# Patient Record
Sex: Male | Born: 1945
Health system: Southern US, Community
[De-identification: ages and names within clinical notes are randomized; demographics above are authoritative.]

## PROBLEM LIST (undated history)

## (undated) DIAGNOSIS — I10 Essential (primary) hypertension: Secondary | ICD-10-CM

## (undated) DIAGNOSIS — J45909 Unspecified asthma, uncomplicated: Secondary | ICD-10-CM

## (undated) DIAGNOSIS — E785 Hyperlipidemia, unspecified: Secondary | ICD-10-CM

## (undated) DIAGNOSIS — G4730 Sleep apnea, unspecified: Secondary | ICD-10-CM

## (undated) DIAGNOSIS — H538 Other visual disturbances: Secondary | ICD-10-CM

## (undated) HISTORY — DX: Other visual disturbances: H53.8

## (undated) HISTORY — PX: POLYPECTOMY: SHX149

## (undated) HISTORY — PX: OTHER SURGICAL HISTORY: SHX169

## (undated) HISTORY — PX: TONSILLECTOMY: SUR1361

## (undated) HISTORY — DX: Sleep apnea, unspecified: G47.30

## (undated) HISTORY — DX: Hyperlipidemia, unspecified: E78.5

## (undated) HISTORY — DX: Essential (primary) hypertension: I10

## (undated) HISTORY — PX: COLONOSCOPY: SHX174

## (undated) HISTORY — PX: HERNIA REPAIR: SHX51

## (undated) HISTORY — DX: Unspecified asthma, uncomplicated: J45.909

---

## 1998-08-31 ENCOUNTER — Encounter (INDEPENDENT_AMBULATORY_CARE_PROVIDER_SITE_OTHER): Payer: Self-pay | Admitting: Specialist

## 1998-08-31 ENCOUNTER — Other Ambulatory Visit: Admission: RE | Admit: 1998-08-31 | Discharge: 1998-08-31 | Payer: Self-pay | Admitting: Gastroenterology

## 2004-11-26 ENCOUNTER — Ambulatory Visit: Payer: Self-pay | Admitting: Internal Medicine

## 2004-12-03 ENCOUNTER — Ambulatory Visit: Payer: Self-pay | Admitting: Internal Medicine

## 2005-01-03 ENCOUNTER — Ambulatory Visit: Payer: Self-pay | Admitting: Internal Medicine

## 2005-03-25 ENCOUNTER — Ambulatory Visit: Payer: Self-pay | Admitting: Internal Medicine

## 2005-07-22 ENCOUNTER — Ambulatory Visit: Payer: Self-pay | Admitting: Internal Medicine

## 2005-08-27 ENCOUNTER — Ambulatory Visit: Payer: Self-pay | Admitting: Internal Medicine

## 2005-08-29 ENCOUNTER — Ambulatory Visit: Payer: Self-pay | Admitting: Internal Medicine

## 2005-09-29 ENCOUNTER — Ambulatory Visit (HOSPITAL_BASED_OUTPATIENT_CLINIC_OR_DEPARTMENT_OTHER): Admission: RE | Admit: 2005-09-29 | Discharge: 2005-09-29 | Payer: Self-pay | Admitting: Internal Medicine

## 2005-10-03 ENCOUNTER — Ambulatory Visit: Payer: Self-pay | Admitting: Pulmonary Disease

## 2005-10-28 ENCOUNTER — Ambulatory Visit: Payer: Self-pay | Admitting: Internal Medicine

## 2005-11-14 ENCOUNTER — Ambulatory Visit: Payer: Self-pay | Admitting: Emergency Medicine

## 2006-04-20 ENCOUNTER — Ambulatory Visit: Payer: Self-pay | Admitting: Internal Medicine

## 2006-04-20 LAB — CONVERTED CEMR LAB
Albumin: 4 g/dL (ref 3.5–5.2)
Alkaline Phosphatase: 59 units/L (ref 39–117)
BUN: 16 mg/dL (ref 6–23)
Basophils Absolute: 0.1 10*3/uL (ref 0.0–0.1)
Bilirubin Urine: NEGATIVE
Cholesterol: 237 mg/dL (ref 0–200)
Direct LDL: 130.8 mg/dL
Eosinophils Absolute: 0.5 10*3/uL (ref 0.0–0.6)
GFR calc Af Amer: 79 mL/min
GFR calc non Af Amer: 65 mL/min
HCT: 50.2 % (ref 39.0–52.0)
HDL: 35.7 mg/dL — ABNORMAL LOW (ref >39.0)
Hemoglobin, Urine: NEGATIVE
Hemoglobin: 17.5 g/dL — ABNORMAL HIGH (ref 13.0–17.0)
Leukocytes, UA: NEGATIVE
MCHC: 34.9 g/dL (ref 30.0–36.0)
MCV: 87.1 fL (ref 78.0–100.0)
Monocytes Absolute: 0.6 10*3/uL (ref 0.2–0.7)
Monocytes Relative: 9.3 % (ref 3.0–11.0)
Neutro Abs: 3.7 10*3/uL (ref 1.4–7.7)
Neutrophils Relative %: 56.7 % (ref 43.0–77.0)
PSA: 0.44 ng/mL (ref 0.10–4.00)
Potassium: 3.6 meq/L (ref 3.5–5.1)
RDW: 12.4 % (ref 11.5–14.6)
Sodium: 141 meq/L (ref 135–145)
TSH: 2.6 microintl units/mL (ref 0.35–5.50)
Total Bilirubin: 2.4 mg/dL — ABNORMAL HIGH (ref 0.3–1.2)
Total CHOL/HDL Ratio: 6.6
Urobilinogen, UA: 0.2 (ref 0.0–1.0)
VLDL: 54 mg/dL — ABNORMAL HIGH (ref 0–40)
pH: 6 (ref 5.0–8.0)

## 2006-04-24 ENCOUNTER — Ambulatory Visit: Payer: Self-pay | Admitting: Internal Medicine

## 2006-04-27 ENCOUNTER — Ambulatory Visit: Payer: Self-pay | Admitting: Internal Medicine

## 2006-05-07 ENCOUNTER — Ambulatory Visit: Payer: Self-pay | Admitting: Internal Medicine

## 2006-07-06 ENCOUNTER — Ambulatory Visit: Payer: Self-pay | Admitting: Internal Medicine

## 2006-10-10 ENCOUNTER — Encounter: Payer: Self-pay | Admitting: Internal Medicine

## 2006-10-10 DIAGNOSIS — E785 Hyperlipidemia, unspecified: Secondary | ICD-10-CM | POA: Insufficient documentation

## 2006-10-10 DIAGNOSIS — J309 Allergic rhinitis, unspecified: Secondary | ICD-10-CM | POA: Insufficient documentation

## 2006-10-10 DIAGNOSIS — I10 Essential (primary) hypertension: Secondary | ICD-10-CM | POA: Insufficient documentation

## 2006-10-10 DIAGNOSIS — Z9889 Other specified postprocedural states: Secondary | ICD-10-CM

## 2006-12-10 ENCOUNTER — Ambulatory Visit: Payer: Self-pay | Admitting: Internal Medicine

## 2007-03-10 ENCOUNTER — Ambulatory Visit: Payer: Self-pay | Admitting: Gastroenterology

## 2007-03-25 ENCOUNTER — Encounter: Payer: Self-pay | Admitting: Internal Medicine

## 2007-03-25 ENCOUNTER — Ambulatory Visit: Payer: Self-pay | Admitting: Gastroenterology

## 2007-04-20 ENCOUNTER — Ambulatory Visit: Payer: Self-pay | Admitting: Internal Medicine

## 2007-04-20 LAB — CONVERTED CEMR LAB
Basophils Relative: 0.3 % (ref 0.0–1.0)
Bilirubin Urine: NEGATIVE
Bilirubin, Direct: 0.2 mg/dL (ref 0.0–0.3)
CO2: 31 meq/L (ref 19–32)
Cholesterol: 253 mg/dL (ref 0–200)
Direct LDL: 75.3 mg/dL
Eosinophils Absolute: 0.3 10*3/uL (ref 0.0–0.6)
Eosinophils Relative: 5 % (ref 0.0–5.0)
GFR calc Af Amer: 79 mL/min
Glucose, Bld: 116 mg/dL — ABNORMAL HIGH (ref 70–99)
HDL: 32.9 mg/dL — ABNORMAL LOW (ref >39.0)
Hemoglobin: 17.2 g/dL — ABNORMAL HIGH (ref 13.0–17.0)
Leukocytes, UA: NEGATIVE
Lymphocytes Relative: 26.6 % (ref 12.0–46.0)
MCV: 88.5 fL (ref 78.0–100.0)
Monocytes Absolute: 0.6 10*3/uL (ref 0.2–0.7)
Monocytes Relative: 8.6 % (ref 3.0–11.0)
Neutro Abs: 3.8 10*3/uL (ref 1.4–7.7)
PSA: 0.51 ng/mL (ref 0.10–4.00)
Potassium: 3.6 meq/L (ref 3.5–5.1)
Specific Gravity, Urine: 1.015 (ref 1.000–1.03)
TSH: 1.72 microintl units/mL (ref 0.35–5.50)
Total Protein, Urine: NEGATIVE mg/dL
Total Protein: 7.1 g/dL (ref 6.0–8.3)
Triglycerides: 639 mg/dL (ref 0–149)
WBC: 6.4 10*3/uL (ref 4.5–10.5)

## 2007-04-26 ENCOUNTER — Ambulatory Visit: Payer: Self-pay | Admitting: Internal Medicine

## 2007-04-26 DIAGNOSIS — H6120 Impacted cerumen, unspecified ear: Secondary | ICD-10-CM | POA: Insufficient documentation

## 2007-04-30 ENCOUNTER — Encounter: Admission: RE | Admit: 2007-04-30 | Discharge: 2007-04-30 | Payer: Self-pay | Admitting: Internal Medicine

## 2007-05-02 ENCOUNTER — Telehealth: Payer: Self-pay | Admitting: Internal Medicine

## 2007-05-28 ENCOUNTER — Encounter: Payer: Self-pay | Admitting: Internal Medicine

## 2007-11-30 ENCOUNTER — Encounter: Payer: Self-pay | Admitting: Internal Medicine

## 2008-01-05 ENCOUNTER — Ambulatory Visit: Payer: Self-pay | Admitting: Internal Medicine

## 2008-02-23 ENCOUNTER — Encounter: Payer: Self-pay | Admitting: Internal Medicine

## 2008-04-25 ENCOUNTER — Ambulatory Visit: Payer: Self-pay | Admitting: Internal Medicine

## 2008-04-25 LAB — CONVERTED CEMR LAB
ALT: 24 units/L (ref 0–53)
AST: 22 units/L (ref 0–37)
Albumin: 3.9 g/dL (ref 3.5–5.2)
BUN: 19 mg/dL (ref 6–23)
Basophils Relative: 0.8 % (ref 0.0–3.0)
CO2: 28 meq/L (ref 19–32)
Chloride: 101 meq/L (ref 96–112)
Creatinine, Ser: 1.2 mg/dL (ref 0.4–1.5)
Direct LDL: 107.2 mg/dL
Eosinophils Absolute: 0.3 10*3/uL (ref 0.0–0.7)
Eosinophils Relative: 4.1 % (ref 0.0–5.0)
GFR calc non Af Amer: 65 mL/min
HCT: 49 % (ref 39.0–52.0)
MCV: 89.8 fL (ref 78.0–100.0)
Neutrophils Relative %: 51.7 % (ref 43.0–77.0)
RBC: 5.46 M/uL (ref 4.22–5.81)
Specific Gravity, Urine: 1.025 (ref 1.000–1.03)
Urine Glucose: NEGATIVE mg/dL
Urobilinogen, UA: 0.2 (ref 0.0–1.0)
WBC: 6.2 10*3/uL (ref 4.5–10.5)
pH: 5.5 (ref 5.0–8.0)

## 2008-05-02 ENCOUNTER — Ambulatory Visit: Payer: Self-pay | Admitting: Internal Medicine

## 2008-05-02 DIAGNOSIS — H18899 Other specified disorders of cornea, unspecified eye: Secondary | ICD-10-CM | POA: Insufficient documentation

## 2008-05-03 DIAGNOSIS — IMO0002 Reserved for concepts with insufficient information to code with codable children: Secondary | ICD-10-CM

## 2008-11-21 ENCOUNTER — Encounter: Payer: Self-pay | Admitting: Internal Medicine

## 2008-12-12 ENCOUNTER — Ambulatory Visit: Payer: Self-pay | Admitting: Internal Medicine

## 2009-05-04 ENCOUNTER — Telehealth: Payer: Self-pay | Admitting: Internal Medicine

## 2009-05-18 ENCOUNTER — Ambulatory Visit: Payer: Self-pay | Admitting: Internal Medicine

## 2009-05-18 LAB — CONVERTED CEMR LAB
AST: 23 units/L (ref 0–37)
Alkaline Phosphatase: 52 units/L (ref 39–117)
BUN: 17 mg/dL (ref 6–23)
Basophils Absolute: 0 10*3/uL (ref 0.0–0.1)
Bilirubin Urine: NEGATIVE
Calcium: 9 mg/dL (ref 8.4–10.5)
Creatinine, Ser: 1.2 mg/dL (ref 0.4–1.5)
GFR calc non Af Amer: 64.72 mL/min (ref >60)
Glucose, Bld: 99 mg/dL (ref 70–99)
HDL: 43.7 mg/dL (ref >39.00)
Hemoglobin, Urine: NEGATIVE
Lymphocytes Relative: 29.9 % (ref 12.0–46.0)
Monocytes Relative: 11.2 % (ref 3.0–12.0)
PSA: 0.6 ng/mL (ref 0.10–4.00)
Platelets: 157 10*3/uL (ref 150.0–400.0)
RDW: 12.4 % (ref 11.5–14.6)
TSH: 1.82 microintl units/mL (ref 0.35–5.50)
Total Bilirubin: 2.3 mg/dL — ABNORMAL HIGH (ref 0.3–1.2)
Triglycerides: 768 mg/dL — ABNORMAL HIGH (ref 0.0–149.0)
Urine Glucose: NEGATIVE mg/dL
Urobilinogen, UA: 0.2 (ref 0.0–1.0)
VLDL: 153.6 mg/dL — ABNORMAL HIGH (ref 0.0–40.0)

## 2009-05-22 ENCOUNTER — Ambulatory Visit: Payer: Self-pay | Admitting: Internal Medicine

## 2009-07-27 ENCOUNTER — Ambulatory Visit: Payer: Self-pay | Admitting: Internal Medicine

## 2009-07-27 LAB — CONVERTED CEMR LAB
ALT: 33 units/L (ref 0–53)
Albumin: 4.3 g/dL (ref 3.5–5.2)
Alkaline Phosphatase: 52 units/L (ref 39–117)
Total Protein: 7.1 g/dL (ref 6.0–8.3)

## 2009-08-16 ENCOUNTER — Telehealth: Payer: Self-pay | Admitting: Internal Medicine

## 2009-08-20 ENCOUNTER — Ambulatory Visit: Payer: Self-pay | Admitting: Internal Medicine

## 2009-12-05 ENCOUNTER — Telehealth: Payer: Self-pay | Admitting: Internal Medicine

## 2009-12-11 ENCOUNTER — Ambulatory Visit: Payer: Self-pay | Admitting: Internal Medicine

## 2010-01-04 ENCOUNTER — Encounter: Payer: Self-pay | Admitting: Internal Medicine

## 2010-03-08 ENCOUNTER — Telehealth: Payer: Self-pay | Admitting: Internal Medicine

## 2010-04-02 NOTE — Progress Notes (Signed)
  Phone Note Refill Request Message from:  Fax from Pharmacy on December 05, 2009 1:47 PM  Refills Requested: Medication #1:  TOPROL XL 50 MG  TB24 1 tab qd Initial call taken by: Ami Bullins CMA,  December 05, 2009 1:47 PM    Prescriptions: TOPROL XL 50 MG  TB24 (METOPROLOL SUCCINATE) 1 tab qd  #30 x 6   Entered by:   Ami Bullins CMA   Authorized by:   Jacques Navy MD   Signed by:   Bill Salinas CMA on 12/05/2009   Method used:   Electronically to        [facility]* (retail)       [address]       [place], [place]  [id number]       Ph: [phone]       Fax: [phone]   RxID:   [id number]

## 2010-04-02 NOTE — Miscellaneous (Signed)
Summary: [facility]   Imported By: Lester [place] 05/31/2009 11:37:31  _____________________________________________________________________  External Attachment:    Type:   Image     Comment:   External Document

## 2010-04-02 NOTE — Assessment & Plan Note (Signed)
Summary: flu shot/men/cd   Nurse Visit   Allergies: No Known Drug Allergies  Orders Added: 1)  Admin 1st Vaccine [90471] 2)  Flu Vaccine 109yrs + [54098] Flu Vaccine Consent Questions     Do you have a history of severe allergic reactions to this vaccine? no    Any prior history of allergic reactions to egg and/or gelatin? no    Do you have a sensitivity to the preservative Thimersol? no    Do you have a past history of Guillan-Barre Syndrome? no    Do you currently have an acute febrile illness? no    Have you ever had a severe reaction to latex? no    Vaccine information given and explained to patient? yes    Are you currently pregnant? no    Lot Number:[id number]   Exp Date:08/31/2010   Site Given  Left Deltoid IMes-CCC]  .lbflu

## 2010-04-02 NOTE — Assessment & Plan Note (Signed)
Summary: shingles shot/men/bcbs-pt called ins and they will cover/cd   Nurse Visit   Allergies: No Known Drug Allergies  Immunizations Administered:  Zostavax # 1:    Vaccine Type: Zostavax    Site: left deltoid    Mfr: Merck    Dose: 0.5 ml    Route: [place]    Given by: Margaret Pyle, CMA    Exp. Date: 08/10/2010    Lot #: [id number]  Orders Added: 1)  Zoster (Shingles) Vaccine Live [90736] 2)  Admin 1st Vaccine [40347]

## 2010-04-02 NOTE — Letter (Signed)
Summary: [facility]   Imported By: Lester [place] 01/14/2010 10:13:20  _____________________________________________________________________  External Attachment:    Type:   Image     Comment:   External Document

## 2010-04-02 NOTE — Progress Notes (Signed)
  Phone Note Refill Request Message from:  Fax from Pharmacy on May 04, 2009 1:19 PM  Refills Requested: Medication #1:  TOPROL XL 50 MG  TB24 1 tab qd Initial call taken by: Ami Bullins CMA,  May 04, 2009 1:19 PM    Prescriptions: TOPROL XL 50 MG  TB24 (METOPROLOL SUCCINATE) 1 tab qd  #30 x 6   Entered by:   Ami Bullins CMA   Authorized by:   Jacques Navy MD   Signed by:   Bill Salinas CMA on 05/04/2009   Method used:   Electronically to        [facility]* (retail)       [address]       [place], [place]  [id number]       Ph: [phone]       Fax: [phone]   RxID:   [id number]

## 2010-04-02 NOTE — Assessment & Plan Note (Signed)
Summary: CPX/BCBS/#/CD   Vital Signs:  Patient profile:   65 year old male Height:      67 inches (170.18 cm) Weight:      169.75 pounds (77.16 kg) BMI:     26.68 O2 Sat:      98 % on Room air Temp:     98.7 degrees F (37.06 degrees C) oral Pulse rate:   93 / minute BP sitting:   152 / 82  (left arm) Cuff size:   regular  Vitals Entered By: Bill Salinas CMA (May 22, 2009 1:27 PM) Taken by Sydell Axon SMA  O2 Flow:  Room air CC: Pt here for CPX. per pt, last eye exam was 08/2008 with Dr Ernesto Rutherford (normal) .//[place]   Primary Care Provider:  Shontavia Mickel  CC:  Pt here for CPX. per pt and last eye exam was 08/2008 with Dr Ernesto Rutherford (normal) .//[place].  History of Present Illness: Patient presents for routine medical evaluation. He has had a good year with no interval illness, injury or surgery. Patient has no complaints or problems.   Current Medications (verified): 1)  Toprol Xl 50 Mg  Tb24 (Metoprolol Succinate) .Marland Kitchen.. 1 Tab Qd 2)  Pulmicort Flexhaler 90 Mcg/act  Inha (Budesonide (Inhalation)) .Marland Kitchen.. 1 Puff Once Daily 3)  Hydrochlorothiazide 25 Mg Tabs (Hydrochlorothiazide) .... Take 1 Tablet By Mouth Once A Day 4)  Gnp Niacin Tr 500 Mg Cr-Tabs (Niacin) .Marland Kitchen.. 1 By Mouth At Bedtime 5)  Aspirin 81 Mg  Tabs (Aspirin) .... Take One Tablet By Mouth 30 Min Before Niacin  Allergies (verified): No Known Drug Allergies  Past History:  Past Medical History: Last updated: May 21, 2007 Allergic rhinitis Hyperlipidemia Hypertension sleep apnea - moderate  Past Surgical History: Last updated: 10/10/2006 Tonsillectomy Herniorrhaphy  Family History: Last updated: May 21, 2007 father-deceased @ 80; asthma and respiratory problems/COPD mother - deceased @ 31; breast cancer survivor, died of alzheimer's Maternal uncle with CAD/CABG Neg- colon, prostate cancer; DM  Social History: Last updated: 2007-05-21 married '83 [employer]; [employer] @ [employer] for [employer] no children Work:  Oceanographer for [employer] Marriage in good health  Risk Factors: Alcohol Use: <1 (21-May-2007) Caffeine Use: 3 (05/21/07)  Risk Factors: Smoking Status: never (05-21-07)  Review of Systems  The patient denies anorexia, fever, weight loss, weight gain, vision loss, decreased hearing, hoarseness, chest pain, syncope, dyspnea on exertion, prolonged cough, headaches, hemoptysis, abdominal pain, hematochezia, hematuria, incontinence, genital sores, muscle weakness, transient blindness, difficulty walking, depression, unusual weight change, enlarged lymph nodes, and angioedema.    Physical Exam  General:  Well-developed,well-nourished,in no acute distress; alert,appropriate and cooperative throughout examination Head:  Normocephalic and atraumatic without obvious abnormalities. No apparent alopecia or balding. Eyes:  vision grossly intact, pupils equal, pupils round, pupils reactive to light, and pupils react to accomodation.   Ears:  R ear normal and L ear normal.   Nose:  no external deformity and no external erythema.   Mouth:  Oral mucosa and oropharynx without lesions or exudates.  Teeth in good repair. Neck:  supple, full ROM, no thyromegaly, and no carotid bruits.   Chest Wall:  no deformities and no tenderness.   Lungs:  Normal respiratory effort, chest expands symmetrically. Lungs are clear to auscultation, no crackles or wheezes. Heart:  Normal rate and regular rhythm. S1 and S2 normal without gallop, murmur, click, rub or other extra sounds. Abdomen:  soft, non-tender, normal bowel sounds, no guarding, no rigidity, and no hepatomegaly.   Rectal:  No external  abnormalities noted. Normal sphincter tone. No rectal masses or tenderness. Genitalia:  varicocele left  Prostate:  Prostate gland firm and smooth, no enlargement, nodularity, tenderness, mass, asymmetry or induration. Msk:  normal ROM, no joint tenderness, no joint swelling, no joint warmth, and no joint deformities.     Pulses:  R radial normal and R femoral normal.   Extremities:  No clubbing, cyanosis, edema, or deformity noted with normal full range of motion of all joints.   Neurologic:  alert & oriented X3, cranial nerves II-XII intact, strength normal in all extremities, sensation intact to light touch, gait normal, and DTRs symmetrical and normal.   Skin:  turgor normal, color normal, no rashes, no suspicious lesions, no petechiae, and no ulcerations.   Cervical Nodes:  No lymphadenopathy noted Axillary Nodes:  no R axillary adenopathy and no L axillary adenopathy.   Inguinal Nodes:  no R inguinal adenopathy and no L inguinal adenopathy.   Psych:  Oriented X3, memory intact for recent and remote, normally interactive, and good eye contact.     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 Dione Booze. He does fairly well with contacts.   Problem # 2:  HYPERTENSION (ICD-401.9)  His updated medication list for this problem includes:    Toprol Xl 50 Mg Tb24 (Metoprolol succinate) .Marland Kitchen... 1 tab qd    Hydrochlorothiazide 25 Mg Tabs (Hydrochlorothiazide) .Marland Kitchen... Take 1 tablet by mouth once a day  BP today: 152/82 Prior BP: 124/74 (05/02/2008)  Mild elevation at today's visit. He reports generally good control at home.  Plan - continue present meds           monitor BP at home.   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  Plan - increase Niasin to 1500mg  at bedtime           f/u lab 4 weeks after attaining recommended dose.  His updated medication list for this problem includes:    Gnp Niacin Tr 500 Mg Cr-tabs (Niacin) .Marland Kitchen... 1 by mouth at bedtime  Problem # 4:  ALLERGIC RHINITIS (ICD-477.9) OTC meds are fine  Problem # 5:  Preventive Health Care (ICD-V70.0) Unremarkable history and normal exam. Labs are fine. Current with colorectal cancer screening. Immunizations current for pneumonia,  tetnus and flu. He will check coverage for shingles vaccine or defer until 64.   In summary - a very nice man who is medically stable and doing well. He will return as needed or in 1 year.   Complete Medication List: 1)  Toprol Xl 50 Mg Tb24 (Metoprolol succinate) .Marland Kitchen.. 1 tab qd 2)  Pulmicort Flexhaler 90 Mcg/act Inha (Budesonide (inhalation)) .Marland Kitchen.. 1 puff once daily 3)  Hydrochlorothiazide 25 Mg Tabs (Hydrochlorothiazide) .... Take 1 tablet by mouth once a day 4)  Gnp Niacin Tr 500 Mg Cr-tabs (Niacin) .Marland Kitchen.. 1 by mouth at bedtime 5)  Aspirin 81 Mg Tabs (Aspirin) .... Take one tablet by mouth 30 min before niacin  Other Orders: Pneumococcal Vaccine ([address]109) Admin 1st Vaccine (60454)   Patient: Jake Simon Note: All result statuses are Final unless otherwise noted.  Tests: (1) BMP (METABOL)   Sodium                    136 mEq/L                   135-145   Potassium            [  L]  3.3 mEq/L                   3.5-5.1   Chloride                  97 mEq/L                    96-112   Carbon Dioxide            31 mEq/L                    19-32   Glucose                   99 mg/dL                    13-08   BUN                       17 mg/dL                    6-57   Creatinine                1.2 mg/dL                   8.4-6.9   Calcium                   9.0 mg/dL                   6.2-95.2   GFR                       64.72 mL/min                >60  Tests: (2) CBC Platelet w/Diff (CBCD)   White Cell Count          6.3 K/uL                    4.5-10.5   Red Cell Count            5.32 Mil/uL                 4.22-5.81   Hemoglobin                16.7 g/dL                   84.1-32.4   Hematocrit                48.7 %                      39.0-52.0   MCV                       91.6 fl                     78.0-100.0   MCHC                      34.3 g/dL                   40.1-02.7   RDW                       12.4 %  11.5-14.6   Platelet Count            157.0 K/uL                   150.0-400.0   Neutrophil %              52.8 %                      43.0-77.0   Lymphocyte %              29.9 %                      12.0-46.0   Monocyte %                11.2 %                      3.0-12.0   Eosinophils%         [H]  5.5 %                       0.0-5.0   Basophils %               0.6 %                       0.0-3.0   Neutrophill Absolute      3.4 K/uL                    1.4-7.7   Lymphocyte Absolute       1.9 K/uL                    0.7-4.0   Monocyte Absolute         0.7 K/uL                    0.1-1.0  Eosinophils, Absolute                             0.3 K/uL                    0.0-0.7   Basophils Absolute        0.0 K/uL                    0.0-0.1  Tests: (3) Hepatic/Liver Function Panel (HEPATIC)   Total Bilirubin      [H]  2.3 mg/dL                   8.1-1.9   Direct Bilirubin          0.3 mg/dL                   1.4-7.8   Alkaline Phosphatase      52 U/L                      39-117   AST                       23 U/L                      0-37   ALT  26 U/L                      0-53   Total Protein             7.4 g/dL                    1.4-7.8   Albumin                   4.1 g/dL                    2.9-5.6  Tests: (4) TSH (TSH)   FastTSH                   1.82 uIU/mL                 0.35-5.50  Tests: (5) Lipid Panel (LIPID)   Cholesterol          [H]  273 mg/dL                   2-130     ATP III Classification            Desirable:  < 200 mg/dL                    Borderline High:  200 - 239 mg/dL               High:  > = 240 mg/dL   Triglycerides        [H]  768.0 mg/dL                 8.6-578.4     Normal:  <150 mg/dL     Borderline High:  696 - 199 mg/dL     Triglyceride is over 400; calculations on Lipids are invalid.   HDL                       29.52 mg/dL                 >84.13   VLDL Cholesterol     [H]  153.6 mg/dL                 2.4-40.1  CHO/HDL Ratio:  CHD Risk                             6                     Men          Women     1/2 Average Risk     3.4          3.3     Average Risk          5.0          4.4     2X Average Risk          9.6          7.1     3X Average Risk          15.0          11.0                           Tests: (6) UDip Only (UDIP)   Color  Dark Yellow       RANGE:  Yellow;Lt. Yellow   Clarity                   CLEAR                       Clear   Specific Gravity          1.020                       1.000 - 1.030   Urine Ph                  6.0                         5.0-8.0   Protein                   NEGATIVE                    Negative   Urine Glucose             NEGATIVE                    Negative   Ketones                   NEGATIVE                    Negative   Urine Bilirubin           NEGATIVE                    Negative   Blood                     NEGATIVE                    Negative   Urobilinogen              0.2                         0.0 - 1.0   Leukocyte Esterace        NEGATIVE                    Negative   Nitrite                   NEGATIVE                    Negative  Tests: (7) Prostate Specific Antigen (PSA)   PSA-Hyb                   0.60 ng/mL                  0.10-4.00  Tests: (8) Cholesterol LDL - Direct (DIRLDL)  Cholesterol LDL - Direct                             66.0 mg/dL  Immunizations Administered:  Pneumonia Vaccine:    Vaccine Type: Pneumovax    Site: left deltoid    Mfr: Merck    Dose: 0.5 ml    Route: IM    Given by: Ami Bullins CMA    Exp. Date: 10/15/2010  Lot #: [id number]    VIS given: 09/29/95 version given May 22, 2009.

## 2010-04-02 NOTE — Progress Notes (Signed)
  Phone Note Outgoing Call   Reason for Call: Discuss lab or test results Summary of Call: reviewed labs. Tgy 1012. Plan stop niacin. Start gemfibrozil 300mg  once daily x 3, 300mg  two times a day x 3, 600/300 x 3 then 600mg  two times a day.  Initial call taken by: Jacques Navy MD,  August 16, 2009 12:53 PM    New/Updated Medications: GEMFIBROZIL 600 MG TABS (GEMFIBROZIL) 1 by mouth two times a day Prescriptions: GEMFIBROZIL 600 MG TABS (GEMFIBROZIL) 1 by mouth two times a day  #60 x 12   Entered and Authorized by:   Jacques Navy MD   Signed by:   Jacques Navy MD on 08/16/2009   Method used:   Electronically to        [facility]* (retail)       [address]       [place], [place]  [id number]       Ph: [phone]       Fax: [phone]   RxID:   [id number]

## 2010-04-04 NOTE — Progress Notes (Signed)
       New/Updated Medications: PULMICORT FLEXHALER 90 MCG/ACT  INHA (BUDESONIDE (INHALATION)) 1-2 puffs daily Prescriptions: PULMICORT FLEXHALER 90 MCG/ACT  INHA (BUDESONIDE (INHALATION)) 1-2 puffs daily  #1 month x 2   Entered by:   Ami Bullins CMA   Authorized by:   Jacques Navy MD   Signed by:   Bill Salinas CMA on 03/08/2010   Method used:   Electronically to        [facility]* (retail)       [address]       [place], [place]  [id number]       Ph: [phone]       Fax: [phone]   RxID:   [id number]

## 2010-06-03 ENCOUNTER — Encounter: Payer: Self-pay | Admitting: Internal Medicine

## 2010-06-26 ENCOUNTER — Encounter: Payer: Self-pay | Admitting: Internal Medicine

## 2010-06-26 ENCOUNTER — Ambulatory Visit (INDEPENDENT_AMBULATORY_CARE_PROVIDER_SITE_OTHER): Payer: Medicare Other | Admitting: Internal Medicine

## 2010-06-26 ENCOUNTER — Other Ambulatory Visit (INDEPENDENT_AMBULATORY_CARE_PROVIDER_SITE_OTHER): Payer: Medicare Other

## 2010-06-26 ENCOUNTER — Other Ambulatory Visit (INDEPENDENT_AMBULATORY_CARE_PROVIDER_SITE_OTHER): Payer: Medicare Other | Admitting: Internal Medicine

## 2010-06-26 VITALS — BP 140/84 | HR 74 | Temp 98.7°F | Wt 167.0 lb

## 2010-06-26 DIAGNOSIS — I10 Essential (primary) hypertension: Secondary | ICD-10-CM

## 2010-06-26 DIAGNOSIS — J309 Allergic rhinitis, unspecified: Secondary | ICD-10-CM

## 2010-06-26 DIAGNOSIS — E785 Hyperlipidemia, unspecified: Secondary | ICD-10-CM

## 2010-06-26 DIAGNOSIS — Z136 Encounter for screening for cardiovascular disorders: Secondary | ICD-10-CM

## 2010-06-26 DIAGNOSIS — Z0000 Encounter for general adult medical examination without abnormal findings: Secondary | ICD-10-CM

## 2010-06-26 LAB — LIPID PANEL
Cholesterol: 212 mg/dL — ABNORMAL HIGH (ref 0–200)
Triglycerides: 234 mg/dL — ABNORMAL HIGH (ref 0.0–149.0)

## 2010-06-26 LAB — CBC WITH DIFFERENTIAL/PLATELET
Basophils Absolute: 0 10*3/uL (ref 0.0–0.1)
Eosinophils Absolute: 0 10*3/uL (ref 0.0–0.7)
HCT: 46.4 % (ref 39.0–52.0)
Lymphs Abs: 1.7 10*3/uL (ref 0.7–4.0)
MCHC: 35.2 g/dL (ref 30.0–36.0)
MCV: 89.7 fl (ref 78.0–100.0)
Monocytes Absolute: 0.6 10*3/uL (ref 0.1–1.0)
Neutrophils Relative %: 65.8 % (ref 43.0–77.0)
Platelets: 202 10*3/uL (ref 150.0–400.0)
RDW: 12.7 % (ref 11.5–14.6)
WBC: 6.6 10*3/uL (ref 4.5–10.5)

## 2010-06-26 LAB — HEPATIC FUNCTION PANEL
AST: 20 U/L (ref 0–37)
Albumin: 4.2 g/dL (ref 3.5–5.2)
Alkaline Phosphatase: 54 U/L (ref 39–117)
Total Bilirubin: 1.7 mg/dL — ABNORMAL HIGH (ref 0.3–1.2)

## 2010-06-26 LAB — COMPREHENSIVE METABOLIC PANEL
ALT: 19 U/L (ref 0–53)
CO2: 29 mEq/L (ref 19–32)
Calcium: 9.6 mg/dL (ref 8.4–10.5)
Chloride: 97 mEq/L (ref 96–112)
Creatinine, Ser: 1.4 mg/dL (ref 0.4–1.5)
GFR: 54.89 mL/min — ABNORMAL LOW (ref >60.00)
Glucose, Bld: 86 mg/dL (ref 70–99)
Total Bilirubin: 1.7 mg/dL — ABNORMAL HIGH (ref 0.3–1.2)

## 2010-06-26 LAB — LDL CHOLESTEROL, DIRECT: Direct LDL: 122.4 mg/dL

## 2010-06-26 NOTE — Progress Notes (Signed)
Subjective:    Patient ID: Jake Simon, male    DOB: 07-21-1945, 65 y.o.   MRN: [id number]  HPI The patient is here for annual Medicare wellness examination and management of other chronic and acute problems. 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 He is now fitted with glasses which may help. He has had a difficult emotional year - seperated from his wife of 28 years. No significant psychologic symptoms.    The risk factors are reflected in the social history.  The roster of all physicians providing medical care to patient - is listed in the Snapshot section of the chart.  Activities of daily living:  The patient is 100% inedpendent in all ADLs: dressing, toileting, feeding as well as independent mobility  Home safety : The patient has smoke detectors in the home. They wear seatbelts.No firearms at home ( firearms are present in the home, kept in a safe fashion). There is no violence in the home.   There is no risks for hepatitis, STDs or HIV. There is no   history of blood transfusion. They have no travel history to infectious disease endemic areas of the world.  The patient has seen their dentist in the last six month. They have seen their eye doctor in the last year (April 2012, slight change in prescription eye glasses). They deny any hearing difficulty and have not had audiologic testing in the last year.  They do not  have excessive sun exposure. Discussed the need for sun protection: hats, long sleeves and use of sunscreen if there is significant sun exposure.   Diet: the importance of a healthy diet is discussed. They do have a healthy (unhealthy-high fat/fast food) diet.  Exercise- regular walking daily; [facility]  Past Medical History  Diagnosis Date  . Allergic rhinitis   . Hyperlipidemia   . HTN (hypertension)   . Sleep apnea     moderate  . Vision blurred     epithelial basement membrane dystrophy    Past Surgical History  Procedure Date  . Tonsillectomy   . Hernia repair    Family History  Problem Relation Age of Onset  . Alzheimer's disease Mother   . Breast cancer Mother   . COPD Father   . Asthma Father   . Diabetes Neg Hx   . Prostate cancer Neg Hx   . Colon cancer Neg Hx    History   Social History  . Marital Status: Married    Spouse Name: N/A    Number of Children: 0  . Years of Education: 14   Occupational History  . [employer]     retired   Social History Main Topics  . Smoking status: Not on file  . Smokeless tobacco: Not on file  . Alcohol Use:   . Drug Use:   . Sexually Active:    Other Topics Concern  . Not on file   Social History Narrative   Married '83- seperated '11.  [employer]; [employer] @ [employer] for [employer].  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 Has pet. Playing contract bridge.         Review of Systems Review of Systems  Constitutional:  Negative for fever, chills, activity change and unexpected weight change.  HENT:  Negative for hearing loss, ear pain, congestion, neck stiffness and postnasal drip.   Eyes: Negative for pain, discharge and visual disturbance.  Respiratory: Negative for chest tightness and wheezing.   Cardiovascular: Negative for chest pain and palpitations.       [No decreased exercise tolerance Gastrointestinal: [No change in bowel habit. No bloating or gas. No reflux or indigestion Genitourinary: Negative for urgency, frequency, flank pain and difficulty urinating.  Musculoskeletal: Negative for myalgias, back pain, arthralgias and gait problem.  Neurological: Negative for dizziness, tremors, weakness and headaches.  Hematological: Negative for adenopathy.  Psychiatric/Behavioral: Negative for behavioral problems and dysphoric mood.       Objective:   Physical Exam Constitutional: He is oriented to person, place, and time. He appears well-developed and  well-nourished.       Healthy appearing white male in no acute distress  HENT:  Head: Normocephalic and atraumatic.  Right Ear: External ear normal.  Left Ear: External ear normal.  EACs and TMs normal bilaterally after irrigation Nose: Nose normal.  Mouth/Throat: Oropharynx is clear and moist.  Eyes: Conjunctivae and EOM are normal. Pupils are equal, round, and reactive to light. Right eye exhibits no discharge. Left eye exhibits no discharge. No scleral icterus.  Neck: Normal range of motion. Neck supple. No JVD present. No tracheal deviation present. No thyromegaly present.  Cardiovascular: Normal rate, regular rhythm and normal heart sounds.  Exam reveals no gallop and no friction rub.   No murmur heard.      Quiet precordium. 2+ radial and DP pulses  Pulmonary/Chest: Effort normal. No respiratory distress. He has no wheezes. He has no rales. He exhibits no tenderness.       No chest wall deformity  Abdominal: Soft. Bowel sounds are normal. He exhibits no distension. There is no tenderness. There is no rebound and no guarding.       No heptosplenomegaly  Rectal Exam - Nl sphincter tone, no external abnormalities. Prostate normal in size without asymmetry or nodules. Musculoskeletal: Normal range of motion. He exhibits no edema and no tenderness.       Small and large joints without redness, synovial thickening or deformity. Full range of motion preserved about all small, median and large joints.  Lymphadenopathy:    He has no cervical adenopathy.  Neurological: He is alert and oriented to person, place, and time. He has normal reflexes. No cranial nerve deficit. Coordination normal.  Skin: Skin is warm and dry. No rash noted. No erythema.  Psychiatric: He has a normal mood and affect. His behavior is normal. Thought content normal.   Lab Results  Component Value Date   WBC 6.6 06/26/2010   HGB 16.3 06/26/2010   HCT 46.4 06/26/2010   PLT 202.0 06/26/2010   CHOL 212* 06/26/2010   TRIG  234.0* 06/26/2010   HDL 36.00* 06/26/2010   LDLDIRECT 122.4 06/26/2010   ALT 19 06/26/2010   ALT 19 06/26/2010   AST 20 06/26/2010   AST 20 06/26/2010   NA 138 06/26/2010   K 3.7 06/26/2010   CL 97 06/26/2010   CREATININE 1.4 06/26/2010   BUN 23 06/26/2010   CO2 29 06/26/2010   TSH 1.82 05/18/2009   PSA 0.60 05/18/2009       LDL (direct)           122.4  06/26/2010       Assessment & Plan:  1. Hypertension - Borderline control. He is asymptomatic. Tolerates present medications.      BP Readings from Last 3 Encounters:  06/26/10 140/84  05/22/09 152/82  05/02/08 124/74   Plan - continue present medicaltions.            Monitor BP at home and report back if SBP is running consistently 140+  2. Hyperlipidemia - For follow-up lab with recommendations to follow.   Addendum - HDL 36 (goal 139+), LDL(direct) 122.4 - (goal 235 or less)  3. Opthal - followed closely for EBMD by opthalmology and doing ok.  4. Health maintenance - Interval history is most significant for opthal prolblems and dissolution of is marriage. He has had some counseling from a professional friend and is doing OK by his own report. Phyiscal exam is unremarkable. His prostate exam is normal with normal PSA in 2011. He is current with colorectal cancer screening with last exam 2009. Immunizations are up to date. 12 Lead EKG is without signs of ischemia or injury. He does get regular exercise and is working on his maintaining his health. He is encouraged to continue the same. His cognition is normal and he remains intellectually exercised playing contract bridge on a regular basis in addition to his other activities.  In summary - a very nice man who is medically stable on his present regimen with the caveat of needing to monitor his blood pressure. He will return as needed or in one year.

## 2010-07-04 ENCOUNTER — Other Ambulatory Visit: Payer: Self-pay | Admitting: Internal Medicine

## 2010-07-19 NOTE — Procedures (Signed)
NAME:  Jake Simon, Jake Simon NO.:  [id number]   MEDICAL RECORD NO.:  [id number]          PATIENT TYPE:  OUT   LOCATION:  SLEEP CENTER                 FACILITY:  [facility]   PHYSICIAN:  Marcelyn Bruins, M.D. [facility] DATE OF BIRTH:  09-Aug-1945   DATE OF STUDY:  09/29/2005                              NOCTURNAL POLYSOMNOGRAM   REFERRING PHYSICIAN:  Rosalyn Gess. Norins, M.D.   INDICATIONS FOR STUDY:  Hypersomnia with sleep apnea time.   EPWORTH SCORE:  5.   SLEEP ARCHITECTURE:  The patient had total sleep time of 402 minutes with  borderline quantity of REM and never achieved slow wave sleep.  Sleep onset  latency was normal as was REM onset.  Sleep efficiency was fairly good at  94%.   RESPIRATORY DATA:  The patient was found to have 25 apneas and 73 hypopneas  for a respiratory disturbance index of 15 events per hour.  Events were not  positional, but they were clearly worse during REM.  There was mild to  moderate snoring noted throughout.   OXYGEN DATA:  The patient was found to have O2 desaturation as low as 87%  with the patient's obstructive events.   CARDIAC DATA:  No clinically significant cardiac arrhythmias.   MOVEMENT/PARASOMNIA:  The patient was found to have 193 leg jerks with 6 per  hour resulting in arousal or awakening.   IMPRESSION/RECOMMENDATIONS:  1.  Mild obstructive sleep apnea/hypopnea syndrome with a respiratory      disturbance index of 15 events per hour and O2 desaturation as low as      87%.  Treatment for this degree of sleep apnea should focus on weight      loss alone if applicable, oral appliance, upper airway surgery, and      possibly CPAP.  2.  Very large numbers of leg jerks with significant sleep disruption.  It      is unclear from the study whether his leg jerks may be due to his sleep      disordered breathing, or whether he may have a concomitant primary      movement disorder of sleep.  Given the very large numbers of leg jerks  with disruption, I would be concerned about the      latter.  Clinical correlation is suggested to see if the patient has      symptoms consistent with this finding or possibly the restless leg      syndrome.           ______________________________  Marcelyn Bruins, M.D. [facility]  Diplomate, American Board of Sleep  Medicine     KC/MEDQ  D:  10/07/2005 16:38:37  T:  10/07/2005 19:34:59  Job:  [id number]

## 2010-07-19 NOTE — Assessment & Plan Note (Signed)
[facility]                               PULMONARY OFFICE NOTE   Jake Simon, Jake Simon                     MRN:          [id number]  DATE:11/14/2005                            DOB:          [birth year]    REASON FOR CONSULTATION:  [name] is kindly referred by Dr. Illene Regulus for snoring and an abnormal sleep study.   BRIEF HISTORY:  [name] is a 65 year old gentleman with hypertension,  hypercholesterolemia, allergic rhinitis.  He was sent for a polysomnogram on  September 30, 2005, after he reported to Dr. Debby Bud that he had been having  frequent snoring that was keeping his wife awake.  She also noted that he  had some episodes where he would stop breathing.  The sleep study showed  sleep disorder breathing with an apnea-hypopnea index of 14.6 events per  hour, with desaturation to approximately 87% consistent with mild to  moderate obstructive sleep apnea.  He also had 6 periodic limb movements per  hour that were associated with arousals.  He tells me that since that sleep  study was done, his snoring seems to be slightly improved.  He has been  using pressure point therapy and also has lost about 8 pounds over the last  couple of months by modifying his diet and his exercise.  He is not having  any significant daytime symptoms.  In particular, he does not nap during the  week and he would rarely nap on a weekend day.  He feels like he is well  rested in the morning when he wakes up.  He denies any morning headache.  He  also states that his wife is noticing that his snoring has decreased since  he has lost weight.  He goes to bed at between 10 and 10:30 p.m.  It takes  him 5 to 10 minutes to fall asleep.  He wakes up between zero and one time a  night, and gets up at 6 a.m.  As mentioned, his weight is down 8 pounds over  the last several months.   PAST MEDICAL HISTORY:  1. Hypertension.  2. Hypercholesterolemia.  3. Allergic  rhinitis.  4. Possible asthma, although it is unclear just whether he has had any      pulmonary function testing performed.  5. Status post tonsillectomy.  6. Status post hernia repair.   ALLERGIES:  No known drug allergies.   CURRENT MEDICATIONS:  1. Metoprolol XL 50 mg daily.  2. Hydrochlorothiazide 25 mg daily.   SOCIAL HISTORY:  The patient is married, he lives with his wife.  He does  not have any children.  He is a never-smoker.  He drinks 1 to 2 alcoholic  beverages a week.   FAMILY HISTORY:  Significant for COPD and breast cancer.   REVIEW OF SYSTEMS:  As per the HPI.   PHYSICAL EXAMINATION:  GENERAL:  This is a pleasant, well-appearing man who  is in no distress on room air.  VITAL SIGNS:  Weight is 163 pounds, temperature 98.3, blood pressure 144/90,  heart rate 79, SpO2  97% on room air.  HEENT:  The oropharynx is clear.  He does not have any posterior pharyngeal  erythema.  NECK:  Supple without lymphadenopathy.  LUNGS:  Clear to auscultation bilaterally.  ABDOMEN:  Benign.  HEART:  Regular rate and rhythm without murmur.  EXTREMITIES:  Have no cyanosis, clubbing or edema.  NEUROLOGIC:  He has a grossly nonfocal exam.   DATA:  Polysomnogram was performed on September 30, 2005, and showed a mild to  moderate sleep apnea, with mild periodic limb movement disorder as detailed  above.   IMPRESSION:  1. Mild to moderate obstructive sleep apnea in the absence of any      significant daytime symptoms.  2. Mild periodic limb movement disorder.   PLANS:  We discussed the significance of his obstructive sleep apnea and the  importance of therapy with regard to managing his hypertension, even in the  absence of daytime symptoms.  He understands that this will be an important  part of his health care maintenance.  He is continuing lifestyle  modification, including dietary modification and exercise.  He intends to  lose approximately 10 more pounds to get to his target  weight.  We will  follow up in 3 months to see if his snoring continues to improve.  If he is  not improved, or if he begins to develop daytime symptoms, then we will need  to pursue other possible therapy for his obstructive sleep apnea.  This may  include CPAP or a dental appliance.                                   Leslye Peer, MD   RSB/MedQ  DD:  11/14/2005  DT:  11/15/2005  Job #:  [id number]   cc:   Rosalyn Gess. Norins, MD

## 2010-07-19 NOTE — Assessment & Plan Note (Signed)
Jake Simon                           PRIMARY CARE OFFICE NOTE   Jake Simon                     MRN:          [id number]  DATE:04/24/2006                            DOB:          [birth year]    [name] is a 65 year old gentleman followed for hypertension,  hyperlipidemia who presents for followup evaluation and exam.  He was  last seen in primary care October 28, 2005 after sleep study and has only  been seen by Dr. Delton Coombes for pulmonary consultation November 14, 2005.   The patient reports he is doing relatively well.  He does report that he  got a prescription for a Maxair inhaler from Dr. Alessandra Bevels  which he uses  on a p.r.n. basis.  He reports he will have approximately 3 episodes a  week where he will have pulmonary congestion for which he uses the  inhaler.  The patient has never been formally diagnosed with asthma and  I could not locate any pulmonary function testing on the chart.   PAST MEDICAL HISTORY:  SURGICAL:  1. Tonsillectomy, remote.  2. Herniorrhaphy at age 36.   MEDICAL ILLNESS:  1. Usual childhood disease.  2. Hypertriglyceridemia.  3. Hypertension.  4. Seasonal allergies.  5. Mild to moderate sleep apnea.   FAMILY HISTORY:  Father with asthma and respiratory problems.  Mother  had breast cancer.  There is a family history for coronary artery  disease with members of the family having bypass surgery.   SOCIAL HISTORY:  The patient has been married for 23 years.  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  His  marriage is in good health.   CURRENT MEDICATIONS:  1. Toprol XL 50 mg daily.  2. HCTZ 25 mg daily.  3. Flonase nasal spray daily.  4. Maxair on a p.r.n. basis.   REVIEW OF CHART:  1. The patient had a stress Cardiolite 2004, read out as a normal      study with an EF of 64%.  2. Last colonoscopy March 25, 2002 which was normal with a followup      study for 2009.  3. 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  Speech discrimination scores were 80% on the      left, 92% on the right.  4. Nocturnal polysomnogram from September 29, 2005 showing mild to moderate      obstructive sleep apnea.   REVIEW OF SYSTEMS:  Negative for any constitutional, cardiovascular, GI,  GU, or musculoskeletal problems.  Respiratory per the HPI.   EXAMINATION:  Temperature 98.6, blood pressure 142/89, pulse 75, weight  161.  GENERAL APPEARANCE:  Well-nourished, well-developed gentleman, looks his  stated age, in no acute distress.  HEENT:  Normocephalic/atraumatic, EACs and TMs were unremarkable.  Oropharynx with native dentition in good repair, no buccal or palatal  lesions  were noted, posterior pharynx was clear.  Conjunctivae and  sclerae was clear.  PERRLA/EOMI, funduscopic exam was unremarkable.  NECK:  Supple without thyromegaly.  NODES:  No adenopathy was noted in the cervical, supraclavicular  regions.  CHEST:  With CVA tenderness, lungs were clear to auscultation.  CARDIOVASCULAR:  With 2+ radial pulse, no JVD or carotid bruits.  He had  a quiet precordium with regular rate and rhythm without murmurs, rubs,  or gallops.  ABDOMEN:  Soft, no guarding, no rebound, no organosplenomegaly was  noted.  GENITALIA:  Normal male phallus bilaterally, descended testicles without  masses.  RECTAL:  Revealed normal sphincter tone.  Prostate is smooth, round,  normal size and contour without nodules.  EXTREMITIES:  Without clubbing, cyanosis, edema, or deformity.  NEUROLOGIC:  Nonfocal.   DATABASE:  A 12 lead electrocardiogram revealed a normal sinus rhythm  and this was a normal EKG.   LABORATORY DATA:  Hemoglobin 17.5 grams, white count was 6600 with a  mild elevation in eosinophils at 7%  otherwise normal differential.  Chemistries were unremarkable with a serum glucose of 106, creatinine  was 1.2, estimated GFR at 65 ml per minute.  Liver functions showed an  increased total bilirubin at 2.4, liver functions otherwise normal.  Cholesterol 237, triglycerides 270, HDL 35.7, LDL was 130.8.  Thyroid  function normal with a TSH of 2.60.  PSA was normal at 0.44, urinalysis  was negative.   ASSESSMENT/PLAN:  1. Pulmonary:  Patient with no history pulmonary function studies to      document his possible asthma.  We have discussed the need for this      in regards to tailoring his therapy.  Specifically we talked about      the use of longacting bronchodilators and/or inhalational steroids      for the management of the asthma in regards to reactive airway      disease.  PLAN:  The patient is scheduled for pulmonary function study, both pre  and post bronchodilators.  He is going to provide this information for  his appointment.  1. Hypertension:  The patient's blood pressure is very well controlled      at this time and he will continue his present medication.  2. Hypertriglyceridemia:  The patient's lipid panel does reveal mild      increase in his lipids.  He has been tried on statin drugs in the      past and he prefers not to take them.  We did discuss overall risk.      His LDL is just at goal for a patient with low to moderate risk per      NCEP guidelines.  At this point he will prefer diet and lifestyle      management rather than medication.  3. Health maintenance:  Patient is current and up to date with      colorectal cancer screening, an EKG today shows it is normal.   In summary he is a very pleasant gentleman who will be evaluated for  asthma with pre and post bronchodilator therapy.  He will be notified by  phone tree as to the results.   The patient is asked to return to see me on a p.r.n. basis.    Rosalyn Gess Norins, MD  Electronically Signed     MEN/MedQ  DD: 04/25/2006  DT: 04/25/2006  Job #: [id number]   cc:   Bridgette Habermann

## 2010-10-11 ENCOUNTER — Other Ambulatory Visit: Payer: Self-pay | Admitting: Internal Medicine

## 2010-11-01 ENCOUNTER — Other Ambulatory Visit: Payer: Self-pay | Admitting: Internal Medicine

## 2010-12-10 ENCOUNTER — Ambulatory Visit (INDEPENDENT_AMBULATORY_CARE_PROVIDER_SITE_OTHER): Payer: Medicare Other

## 2010-12-10 DIAGNOSIS — Z23 Encounter for immunization: Secondary | ICD-10-CM

## 2010-12-11 ENCOUNTER — Other Ambulatory Visit: Payer: Self-pay | Admitting: Internal Medicine

## 2011-01-30 ENCOUNTER — Telehealth: Payer: Self-pay | Admitting: Internal Medicine

## 2011-01-30 MED ORDER — METOPROLOL SUCCINATE ER 50 MG PO TB24: 50.0000 mg | ORAL_TABLET | Freq: Every day | ORAL | Status: DC

## 2011-01-30 NOTE — Telephone Encounter (Signed)
Faxed Rx to pharmacy for Metoprolol.

## 2011-01-30 NOTE — Telephone Encounter (Signed)
Faxed refill request for Metoprolol to pharmacy

## 2011-02-10 ENCOUNTER — Telehealth: Payer: Self-pay

## 2011-02-10 MED ORDER — MOMETASONE FUROATE 220 MCG/INH IN AEPB: 2.0000 | INHALATION_SPRAY | Freq: Every day | RESPIRATORY_TRACT | Status: DC

## 2011-02-10 NOTE — Telephone Encounter (Signed)
Ok for Rx and update med list

## 2011-02-10 NOTE — Telephone Encounter (Signed)
Pt called stating he was advised by Dr Tommy Medal to call triage when he was ready for Asmanex 1-4 puffs qd to be called into pharmacy. Please advise, okay to update and Rx?

## 2011-03-12 ENCOUNTER — Other Ambulatory Visit: Payer: Self-pay | Admitting: *Deleted

## 2011-03-12 MED ORDER — HYDROCHLOROTHIAZIDE 25 MG PO TABS: 25.0000 mg | ORAL_TABLET | Freq: Every day | ORAL | Status: DC

## 2011-05-26 ENCOUNTER — Other Ambulatory Visit: Payer: Self-pay | Admitting: Internal Medicine

## 2011-06-10 ENCOUNTER — Ambulatory Visit (INDEPENDENT_AMBULATORY_CARE_PROVIDER_SITE_OTHER): Payer: Medicare Other | Admitting: Internal Medicine

## 2011-06-10 ENCOUNTER — Other Ambulatory Visit (INDEPENDENT_AMBULATORY_CARE_PROVIDER_SITE_OTHER): Payer: Medicare Other

## 2011-06-10 ENCOUNTER — Encounter: Payer: Self-pay | Admitting: Internal Medicine

## 2011-06-10 VITALS — BP 144/92 | HR 88 | Temp 98.4°F | Resp 16 | Ht 66.0 in | Wt 172.0 lb

## 2011-06-10 DIAGNOSIS — Z0000 Encounter for general adult medical examination without abnormal findings: Secondary | ICD-10-CM

## 2011-06-10 DIAGNOSIS — J309 Allergic rhinitis, unspecified: Secondary | ICD-10-CM

## 2011-06-10 DIAGNOSIS — E785 Hyperlipidemia, unspecified: Secondary | ICD-10-CM

## 2011-06-10 DIAGNOSIS — I10 Essential (primary) hypertension: Secondary | ICD-10-CM

## 2011-06-10 LAB — COMPREHENSIVE METABOLIC PANEL
AST: 22 U/L (ref 0–37)
Alkaline Phosphatase: 52 U/L (ref 39–117)
BUN: 30 mg/dL — ABNORMAL HIGH (ref 6–23)
Glucose, Bld: 110 mg/dL — ABNORMAL HIGH (ref 70–99)
Potassium: 3.5 mEq/L (ref 3.5–5.1)
Sodium: 140 mEq/L (ref 135–145)
Total Bilirubin: 1.2 mg/dL (ref 0.3–1.2)

## 2011-06-10 LAB — LIPID PANEL
HDL: 35.7 mg/dL — ABNORMAL LOW (ref >39.00)
Triglycerides: 367 mg/dL — ABNORMAL HIGH (ref 0.0–149.0)
VLDL: 73.4 mg/dL — ABNORMAL HIGH (ref 0.0–40.0)

## 2011-06-10 NOTE — Progress Notes (Signed)
Subjective:    Patient ID: Jake Simon, male    DOB: [birth year], 66 y.o.   MRN: [id number]  HPI The patient is here for annual Medicare wellness examination and management of other chronic and acute problems. He has been healthy since his last visit with no major illness,  Surgery-had implant place, no injury. No change in level of activity.    The risk factors are reflected in the social history.  The roster of all physicians providing medical care to patient - is listed in the Snapshot section of the chart.  Activities of daily living:  The patient is 100% inedpendent in all ADLs: dressing, toileting, feeding as well as independent mobility  Home safety : The patient has smoke detectors in the home. Fall safety - house is fall safe, grab bars. They wear seatbelts. No firearms at home.  There is no violence in the home.   There is no risks for hepatitis, STDs or HIV. There is no history of blood transfusion. They have no travel history to infectious disease endemic areas of the world.  The patient has  seen their dentist in the last six month. They have seen their eye doctor in the last year. They deny any hearing difficulty and have not had audiologic testing in the last year.  They do not  have excessive sun exposure. Discussed the need for sun protection: hats, long sleeves and use of sunscreen if there is significant sun exposure.   Diet: the importance of a healthy diet is discussed. They do have a healthy diet.  The patient has a regular exercise program: dog walks 2.5 miles, gym occasionally , 40 min duration, 7 per week.  The benefits of regular aerobic exercise were discussed.  Depression screen: there are no signs or vegative symptoms of depression- irritability, change in appetite, anhedonia, sadness/tearfullness.  Cognitive assessment: the patient manages all their financial and personal affairs and is actively engaged.   The following portions of the patient's history  were reviewed and updated as appropriate: allergies, current medications, past family history, past medical history,  past surgical history, past social history  and problem list.  Vision, hearing, body mass index were assessed and reviewed.   During the course of the visit the patient was educated and counseled about appropriate screening and preventive services including : fall prevention , diabetes screening, nutrition counseling, colorectal cancer screening, and recommended immunizations.  Past Medical History  Diagnosis Date  . Allergic rhinitis   . Hyperlipidemia   . HTN (hypertension)   . Sleep apnea     moderate  . Vision blurred     epithelial basement membrane dystrophy   Past Surgical History  Procedure Date  . Tonsillectomy   . Hernia repair   . Dental implants    Family History  Problem Relation Age of Onset  . Alzheimer's disease Mother   . Breast cancer Mother   . Mental illness Mother     alzheimers  . COPD Father   . Asthma Father   . Stroke Father   . Diabetes Neg Hx   . Prostate cancer Neg Hx   . Colon cancer Neg Hx   . Heart disease Neg Hx   . Hyperlipidemia Neg Hx   . Cancer Sister     brain cancer  . Arthritis Sister    History   Social History  . Marital Status: Married    Spouse Name: N/A    Number of Children: 0  .  Years of Education: 68   Occupational History  . [employer]     retired   Social History Main Topics  . Smoking status: Never Smoker   . Smokeless tobacco: Never Used  . Alcohol Use: Yes     occasional beer, wine  . Drug Use: No  . Sexually Active: Not Currently   Other Topics Concern  . Not on file   Social History Narrative   Married '83- seperated '11.  [place] [employer]; [employer] @ [employer] for [employer].  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 Has pet. Playing contract bridge. ACP - raised the issues and encouraged the appointment of a HCPOA and to consider detailed directive  re: care.     Current Outpatient Prescriptions on File Prior to Visit  Medication Sig Dispense Refill  . aspirin 81 MG EC tablet Take 81 mg by mouth daily. 30 min before niacin       . hydrochlorothiazide (HYDRODIURIL) 25 MG tablet Take 1 tablet (25 mg total) by mouth daily.  90 tablet  0  . LOPID 600 MG tablet TAKE 1 TABLET TWICE DAILY.  60 each  4  . mometasone (ASMANEX) 220 MCG/INH inhaler Inhale 2 puffs into the lungs daily.  1 Inhaler  2  . TOPROL XL 50 MG 24 hr tablet TAKE 1 TABLET ONCE DAILY.  30 each  5  . PULMICORT FLEXHALER 90 MCG/ACT inhaler USE 1 OR 2 PUFFS DAILY.  1 each  4      Review of Systems Constitutional:  Negative for fever, chills, activity change and unexpected weight change.  HEENT:  Negative for hearing loss, ear pain, congestion, neck stiffness and postnasal drip. Negative for sore throat or swallowing problems. Negative for dental complaints.   Eyes: Negative for vision loss or change in visual acuity.  Respiratory: Negative for chest tightness and wheezing. Negative for DOE.   Cardiovascular: Negative for chest pain or palpitations. No decreased exercise tolerance Gastrointestinal: No change in bowel habit. No bloating or gas. No reflux or indigestion Genitourinary: Negative for urgency, frequency, flank pain and difficulty urinating.  Musculoskeletal: Negative for myalgias, back pain, arthralgias and gait problem.  Neurological: Negative for dizziness, tremors, weakness and headaches.  Hematological: Negative for adenopathy.  Psychiatric/Behavioral: Negative for behavioral problems and dysphoric mood.       Objective:   Physical Exam Filed Vitals:   06/10/11 1456  BP: 144/92  Pulse: 88  Temp: 98.4 F (36.9 C)  Resp: 16   Wt Readings from Last 3 Encounters:  06/10/11 172 lb (78.019 kg)  06/26/10 167 lb (75.751 kg)  05/22/09 169 lb 12 oz (76.998 kg)    Gen'l: Well nourished well developed white male in no acute distress  HEENT: Head:  Normocephalic and atraumatic. Right Ear: External ear normal. EAC/TM nl. Left Ear: External ear normal.  EAC/TM nl. Nose: Nose normal. Mouth/Throat: Oropharynx is clear and moist. Dentition - native, in good repair. No buccal or palatal lesions. Posterior pharynx clear. Eyes: Conjunctivae and sclera clear. EOM intact. Pupils are equal, round, and reactive to light. Right eye exhibits no discharge. Left eye exhibits no discharge. Neck: Normal range of motion. Neck supple. No JVD present. No tracheal deviation present. No thyromegaly present.  Cardiovascular: Normal rate, regular rhythm, no gallop, no friction rub, no murmur heard.      Quiet precordium. 2+ radial and DP pulses . No carotid bruits Pulmonary/Chest: Effort normal. No respiratory distress or increased WOB, no wheezes,  no rales. No chest wall deformity or CVAT. Abdominal: Soft. Bowel sounds are normal in all quadrants. He exhibits no distension, no tenderness, no rebound or guarding, No heptosplenomegaly  Genitourinary:  deferred to GU Musculoskeletal: Normal range of motion. He exhibits no edema and no tenderness.       Small and large joints without redness, synovial thickening or deformity. Full range of motion preserved about all small, median and large joints.  Lymphadenopathy:    He has no cervical or supraclavicular adenopathy.  Neurological: He is alert and oriented to person, place, and time. CN II-XII intact. DTRs 2+ and symmetrical biceps, radial and patellar tendons. Cerebellar function normal with no tremor, rigidity, normal gait and station.  Skin: Skin is warm and dry. No rash noted. No erythema.  Psychiatric: He has a normal mood and affect. His behavior is normal. Thought content normal.   Lab Results  Component Value Date   WBC 6.6 06/26/2010   HGB 16.3 06/26/2010   HCT 46.4 06/26/2010   PLT 202.0 06/26/2010   GLUCOSE 110* 06/10/2011   CHOL 252* 06/10/2011   TRIG 367.0* 06/10/2011   HDL 35.70* 06/10/2011   LDLDIRECT 151.5  06/10/2011   ALT 20 06/10/2011   AST 22 06/10/2011   NA 140 06/10/2011   K 3.5 06/10/2011   CL 100 06/10/2011   CREATININE 1.4 06/10/2011   BUN 30* 06/10/2011   CO2 29 06/10/2011   TSH 1.82 05/18/2009   PSA 0.60 05/18/2009          Assessment & Plan:

## 2011-06-11 DIAGNOSIS — Z0001 Encounter for general adult medical examination with abnormal findings: Secondary | ICD-10-CM | POA: Insufficient documentation

## 2011-06-11 LAB — LDL CHOLESTEROL, DIRECT: Direct LDL: 151.5 mg/dL

## 2011-06-11 MED ORDER — LOSARTAN POTASSIUM 50 MG PO TABS: 50.0000 mg | ORAL_TABLET | Freq: Every day | ORAL | Status: DC

## 2011-06-11 NOTE — Assessment & Plan Note (Signed)
Doing well but the allergy season is upon [place] - he will use hs nasonex as needed.

## 2011-06-11 NOTE — Assessment & Plan Note (Signed)
Sub-opitmal control with LDL greater than goal of 130 or less. LDL in '12 - 122. His weight is up in the interval.  Plan - continue with a low fat diet           Work on weight loss           Continue lopid            Follow-up lab in 6 months

## 2011-06-11 NOTE — Assessment & Plan Note (Signed)
Interval medical history is negative for any major changes or events. Physical exam is normal. Labs reveal mild increased elevation in LDL but are otherwise normal. He is current with colorectal cancer screening. Discussed current recommendations on Prostate cancer screening - pros and cons- and he opts out of testing this year. (previous PSA was very low). Immunizations are all up to date.  In summary - a very nice man whose LDL is a little high which will be addressed with better life-style management with follow up lab in 6 months; whose blood pressure has been sub optimal for 3 readings in a row Plan is to add losartan ( an ARB) with follow up office visit in 4-6 weeks. In the main he is doing well and is in good condition for a man his age.

## 2011-06-11 NOTE — Assessment & Plan Note (Signed)
BP Readings from Last 3 Encounters:  06/10/11 144/92  06/26/10 140/84  05/22/09 152/82   Sub optimal control.  Plan - will add third agent for better control - Angiotensin Receptor Blockaide using losartan 50 mg daily            Follow-up OV in 4-6 weeks for BP check and follow up renal panel

## 2011-06-14 ENCOUNTER — Other Ambulatory Visit: Payer: Self-pay | Admitting: Internal Medicine

## 2011-09-12 ENCOUNTER — Other Ambulatory Visit: Payer: Self-pay | Admitting: *Deleted

## 2011-09-12 DIAGNOSIS — I10 Essential (primary) hypertension: Secondary | ICD-10-CM

## 2011-09-12 MED ORDER — LOSARTAN POTASSIUM-HCTZ 100-25 MG PO TABS: 1.0000 | ORAL_TABLET | Freq: Every day | ORAL | Status: DC

## 2011-09-12 NOTE — Telephone Encounter (Signed)
done

## 2011-09-12 NOTE — Telephone Encounter (Signed)
Pt is currently taking Cozaar and HCTZ and needs refills on both. Pt wants rx for combination pill with both medications sent to [facility] advise in MEN's absence, thanks.

## 2011-09-12 NOTE — Telephone Encounter (Signed)
[place] pharmacist informed. 

## 2011-09-15 ENCOUNTER — Other Ambulatory Visit: Payer: Self-pay | Admitting: *Deleted

## 2011-09-15 MED ORDER — LOSARTAN POTASSIUM 50 MG PO TABS: 50.0000 mg | ORAL_TABLET | Freq: Every day | ORAL | Status: DC

## 2011-09-15 NOTE — Telephone Encounter (Signed)
Patient prefer losarton 50mg  tabs instead of 100mg . Sent to [facility].

## 2011-11-11 ENCOUNTER — Ambulatory Visit (INDEPENDENT_AMBULATORY_CARE_PROVIDER_SITE_OTHER): Payer: Medicare Other | Admitting: General Practice

## 2011-11-11 DIAGNOSIS — Z23 Encounter for immunization: Secondary | ICD-10-CM

## 2011-11-20 ENCOUNTER — Other Ambulatory Visit: Payer: Self-pay | Admitting: Internal Medicine

## 2011-11-21 ENCOUNTER — Other Ambulatory Visit: Payer: Self-pay | Admitting: *Deleted

## 2011-11-21 MED ORDER — METOPROLOL SUCCINATE ER 50 MG PO TB24: 50.0000 mg | ORAL_TABLET | Freq: Every day | ORAL | Status: DC

## 2012-06-07 ENCOUNTER — Other Ambulatory Visit: Payer: Self-pay | Admitting: Internal Medicine

## 2012-06-14 ENCOUNTER — Encounter: Payer: Medicare Other | Admitting: Internal Medicine

## 2012-06-17 ENCOUNTER — Other Ambulatory Visit: Payer: Self-pay | Admitting: Internal Medicine

## 2012-07-05 ENCOUNTER — Other Ambulatory Visit: Payer: Self-pay | Admitting: Internal Medicine

## 2012-08-03 ENCOUNTER — Encounter: Payer: Self-pay | Admitting: Internal Medicine

## 2012-08-03 ENCOUNTER — Ambulatory Visit (INDEPENDENT_AMBULATORY_CARE_PROVIDER_SITE_OTHER): Payer: Medicare Other | Admitting: Internal Medicine

## 2012-08-03 VITALS — BP 150/96 | HR 83 | Temp 98.0°F | Resp 12 | Wt 174.0 lb

## 2012-08-03 DIAGNOSIS — Z0000 Encounter for general adult medical examination without abnormal findings: Secondary | ICD-10-CM

## 2012-08-03 DIAGNOSIS — H6123 Impacted cerumen, bilateral: Secondary | ICD-10-CM

## 2012-08-03 DIAGNOSIS — J4530 Mild persistent asthma, uncomplicated: Secondary | ICD-10-CM

## 2012-08-03 DIAGNOSIS — H18899 Other specified disorders of cornea, unspecified eye: Secondary | ICD-10-CM

## 2012-08-03 DIAGNOSIS — I10 Essential (primary) hypertension: Secondary | ICD-10-CM

## 2012-08-03 DIAGNOSIS — H6120 Impacted cerumen, unspecified ear: Secondary | ICD-10-CM

## 2012-08-03 DIAGNOSIS — E785 Hyperlipidemia, unspecified: Secondary | ICD-10-CM

## 2012-08-03 NOTE — Patient Instructions (Addendum)
Thanks for coming in to see me.  You appear to be healthy. Remember - exercise and taking care of your health is your JOB.  Labs are ordered for you to come back for at your convenience. Results will be sent to you via MyChart (please sign up).  Full report to follow.   See you next year.

## 2012-08-03 NOTE — Progress Notes (Signed)
Subjective:    Patient ID: Jake Simon, male    DOB: 12-11-1945, 67 y.o.   MRN: [id number]  HPI Jake Simon is here for annual Medicare wellness examination and management of other chronic and acute problems.  He reports that he is feeling well and doing well. He does continue on inhalational steroid treatment for his asthma. In the interval no major medical illness except for new on-set eczema treated with topical steroids. 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 No surgery and no injury. He does feel he has possible build up of cerumen.   The risk factors are reflected in the social history.  The roster of all physicians providing medical care to patient - is listed in the Snapshot section of the chart.  Activities of daily living:  The patient is 100% inedpendent in all ADLs: dressing, toileting, feeding as well as independent mobility  Home safety : The patient has smoke detectors in the home. Falls - none. Home is fall safe. They wear seatbelts. No firearms at home. There is no violence in the home.   There is no risks for hepatitis, STDs or HIV. There is no history of blood transfusion. They have no travel history to infectious disease endemic areas of the world.  The patient has  seen their dentist in the last six month. They have seen their eye doctor in the last year. They admit to hearing difficulty and have not had audiologic testing in the last year, last 4 years ago.  They do not  have excessive sun exposure. Discussed the need for sun protection: hats, long sleeves and use of sunscreen if there is significant sun exposure.   Diet: the importance of a healthy diet is discussed. They do have a healthy diet.  The patient has a regular exercise program: walks 2-3 miles daily; goes to the gym on occasion.  The benefits of regular aerobic exercise were discussed.  Depression  screen: there are no signs or vegative symptoms of depression- irritability, change in appetite, anhedonia, sadness/tearfullness.  Cognitive assessment: the patient manages all their financial and personal affairs and is actively engaged.   The following portions of the patient's history were reviewed and updated as appropriate: allergies, current medications, past family history, past medical history,  past surgical history, past social history  and problem list.  Vision, hearing, body mass index were assessed and reviewed.   During the course of the visit the patient was educated and counseled about appropriate screening and preventive services including : fall prevention , diabetes screening, nutrition counseling, colorectal cancer screening, and recommended immunizations.  Past Medical History  Diagnosis Date  . Allergic rhinitis   . Hyperlipidemia   . HTN (hypertension)   . Sleep apnea     moderate  . Vision blurred     epithelial basement membrane dystrophy   Past Surgical History  Procedure Laterality Date  . Tonsillectomy    . Hernia repair    . Dental implants     Family History  Problem Relation Age of Onset  . Alzheimer's disease Mother   . Breast cancer Mother   . Mental illness Mother     alzheimers  . COPD Father   . Asthma Father   . Stroke Father   . Diabetes Neg Hx   . Prostate cancer Neg Hx   . Colon cancer Neg Hx   . Heart disease Neg Hx   .  Hyperlipidemia Neg Hx   . Cancer Sister     brain cancer  . Arthritis Sister    History   Social History  . Marital Status: Married    Spouse Name: N/A    Number of Children: 0  . Years of Education: 43   Occupational History  . [employer]     retired   Social History Main Topics  . Smoking status: Never Smoker   . Smokeless tobacco: Never Used  . Alcohol Use: Yes     Comment: occasional beer, wine  . Drug Use: No  . Sexually Active: Not Currently   Other Topics Concern  .  Not on file   Social History Narrative   Married '83- seperated '11.  [employer] [employer]; [employer] @ [employer] for [employer].  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 Has pet. Playing contract bridge. ACP - raised the issues and encouraged the appointment of a HCPOA and to consider detailed directive re: care.                 Current Outpatient Prescriptions on File Prior to Visit  Medication Sig Dispense Refill  . ASMANEX 60 METERED DOSES 220 MCG/INH inhaler USE 2 PUFFS DAILY  1 Inhaler  1  . aspirin 81 MG EC tablet Take 81 mg by mouth daily. 30 min before niacin       . gemfibrozil (LOPID) 600 MG tablet TAKE 1 TABLET TWICE DAILY.  180 tablet  0  . hydrochlorothiazide (HYDRODIURIL) 25 MG tablet TAKE 1 TABLET EACH DAY.  90 tablet  0  . losartan (COZAAR) 50 MG tablet Take 1 tablet (50 mg total) by mouth daily.  30 tablet  11  . metoprolol succinate (TOPROL XL) 50 MG 24 hr tablet Take 1 tablet (50 mg total) by mouth daily. Take with or immediately following a meal.  30 tablet  10   No current facility-administered medications on file prior to visit.      Review of Systems Constitutional:  Negative for fever, chills, activity change and unexpected weight change.  HEENT:  Negative for hearing loss, ear pain, congestion, neck stiffness and postnasal drip. Negative for sore throat or swallowing problems. Negative for dental complaints.   Eyes: Negative for vision loss or change in visual acuity.  Respiratory: Negative for chest tightness and wheezing. Negative for DOE.   Cardiovascular: Negative for chest pain or palpitations. No decreased exercise tolerance Gastrointestinal: No change in bowel habit. No bloating or gas. No reflux or indigestion Genitourinary: Negative for urgency, frequency, flank pain and difficulty urinating.  Musculoskeletal: Negative for myalgias, back pain, arthralgias and gait problem.  Neurological: Negative for dizziness, tremors, weakness and  headaches.  Hematological: Negative for adenopathy.  Psychiatric/Behavioral: Negative for behavioral problems and dysphoric mood.       Objective:   Physical Exam Filed Vitals:   08/03/12 1427  BP: 150/96  Pulse: 83  Temp: 98 F (36.7 C)  Resp: 12   Wt Readings from Last 3 Encounters:  08/03/12 174 lb (78.926 kg)  06/10/11 172 lb (78.019 kg)  06/26/10 167 lb (75.751 kg)   Gen'l: Well nourished well developed white male in no acute distress  HEENT: Head: Normocephalic and atraumatic. Right Ear: External ear normal. EAC-with impaction/. Left Ear: External ear normal.  EAwith cerumen Nose: Nose normal. Mouth/Throat: Oropharynx is clear and moist. Dentition - native, in good repair. No buccal or palatal lesions. Posterior pharynx clear. Eyes: Conjunctivae and sclera  clear. EOM intact. Pupils are equal, round, and reactive to light. Right eye exhibits no discharge. Left eye exhibits no discharge. Neck: Normal range of motion. Neck supple. No JVD present. No tracheal deviation present. No thyromegaly present.  Cardiovascular: Normal rate, regular rhythm, no gallop, no friction rub, no murmur heard.      Quiet precordium. 2+ radial and DP pulses . No carotid bruits Pulmonary/Chest: Effort normal. No respiratory distress or increased WOB, no wheezes, no rales. No chest wall deformity or CVAT. Abdomen: Soft. Bowel sounds are normal in all quadrants. He exhibits no distension, no tenderness, no rebound or guarding, No heptosplenomegaly  Genitourinary: deferred  Musculoskeletal: Normal range of motion. He exhibits no edema and no tenderness.       Small and large joints without redness, synovial thickening or deformity. Full range of motion preserved about all small, median and large joints.  Lymphadenopathy:    He has no cervical or supraclavicular adenopathy.  Neurological: He is alert and oriented to person, place, and time. CN II-XII intact. DTRs 2+ and symmetrical biceps, radial and  patellar tendons. Cerebellar function normal with no tremor, rigidity, normal gait and station.  Skin: Skin is warm and dry. No rash noted. No erythema.  Psychiatric: He has a normal mood and affect. His behavior is normal. Thought content normal.          Assessment & Plan:

## 2012-08-04 ENCOUNTER — Other Ambulatory Visit (INDEPENDENT_AMBULATORY_CARE_PROVIDER_SITE_OTHER): Payer: Medicare Other

## 2012-08-04 DIAGNOSIS — E785 Hyperlipidemia, unspecified: Secondary | ICD-10-CM

## 2012-08-04 DIAGNOSIS — J45909 Unspecified asthma, uncomplicated: Secondary | ICD-10-CM | POA: Insufficient documentation

## 2012-08-04 DIAGNOSIS — I10 Essential (primary) hypertension: Secondary | ICD-10-CM

## 2012-08-04 LAB — COMPREHENSIVE METABOLIC PANEL
Albumin: 4.2 g/dL (ref 3.5–5.2)
Alkaline Phosphatase: 45 U/L (ref 39–117)
BUN: 31 mg/dL — ABNORMAL HIGH (ref 6–23)
Calcium: 9.5 mg/dL (ref 8.4–10.5)
Chloride: 99 mEq/L (ref 96–112)
Creatinine, Ser: 1.7 mg/dL — ABNORMAL HIGH (ref 0.4–1.5)
Glucose, Bld: 99 mg/dL (ref 70–99)
Potassium: 4.1 mEq/L (ref 3.5–5.1)

## 2012-08-04 LAB — HEPATIC FUNCTION PANEL
ALT: 19 U/L (ref 0–53)
Alkaline Phosphatase: 45 U/L (ref 39–117)
Bilirubin, Direct: 0.2 mg/dL (ref 0.0–0.3)
Total Bilirubin: 1.9 mg/dL — ABNORMAL HIGH (ref 0.3–1.2)

## 2012-08-04 LAB — LIPID PANEL: HDL: 33.2 mg/dL — ABNORMAL LOW (ref >39.00)

## 2012-08-04 LAB — LDL CHOLESTEROL, DIRECT: Direct LDL: 121.9 mg/dL

## 2012-08-04 NOTE — Assessment & Plan Note (Signed)
Interval history is benign. Physical exam is normal. Lab results are pending. He is current with colorectal cancer screening. Discussed pros and cons of prostate cancer screening (USPHCTF recommendations reviewed and ACU April '13 recommendations) and he defers evaluation at this time. Immunizations are up to date.  In summary - a very nice man who is medically stable at this time. He will return as needed or in 1 year.

## 2012-08-04 NOTE — Assessment & Plan Note (Signed)
Doing well without need for break-thru treatments

## 2012-08-04 NOTE — Assessment & Plan Note (Signed)
Recurrent cerumen impactions. Cleared with irrigation w/o complications

## 2012-08-04 NOTE — Assessment & Plan Note (Signed)
BP Readings from Last 3 Encounters:  08/03/12 150/96  06/10/11 144/92  06/26/10 140/84   Borderline control but within JNC 8 guidelines  Plan Lab: routine metabolic labs  Continue present medications but monitor BP at home and report back for SBP that is consistently at or above `150

## 2012-08-04 NOTE — Assessment & Plan Note (Signed)
Stable at this time. He does follow with Dr. Dione Booze .

## 2012-08-04 NOTE — Assessment & Plan Note (Signed)
On lopid only and last LDL was in 150's. He does recall having some muscle fatigue, not pain, with previous trial of statin drugs. His triglycerides are better.  Plan Lab: lipid panel  Recommendations to follow - will consider another challenge with a statin if his LDL is above 150

## 2012-09-01 ENCOUNTER — Other Ambulatory Visit: Payer: Self-pay | Admitting: Internal Medicine

## 2012-09-17 ENCOUNTER — Other Ambulatory Visit: Payer: Self-pay | Admitting: Internal Medicine

## 2012-10-06 ENCOUNTER — Other Ambulatory Visit: Payer: Self-pay

## 2012-11-23 ENCOUNTER — Ambulatory Visit (INDEPENDENT_AMBULATORY_CARE_PROVIDER_SITE_OTHER): Payer: Medicare Other

## 2012-11-23 DIAGNOSIS — Z23 Encounter for immunization: Secondary | ICD-10-CM

## 2012-12-13 ENCOUNTER — Encounter: Payer: Self-pay | Admitting: Internal Medicine

## 2012-12-20 ENCOUNTER — Other Ambulatory Visit: Payer: Self-pay | Admitting: Internal Medicine

## 2013-02-03 ENCOUNTER — Ambulatory Visit: Payer: Medicare Other | Admitting: Internal Medicine

## 2013-02-03 ENCOUNTER — Ambulatory Visit (INDEPENDENT_AMBULATORY_CARE_PROVIDER_SITE_OTHER): Payer: Medicare Other | Admitting: Internal Medicine

## 2013-02-03 ENCOUNTER — Encounter: Payer: Self-pay | Admitting: Internal Medicine

## 2013-02-03 VITALS — BP 144/90 | HR 85 | Temp 98.4°F | Wt 173.8 lb

## 2013-02-03 DIAGNOSIS — I10 Essential (primary) hypertension: Secondary | ICD-10-CM

## 2013-02-03 DIAGNOSIS — N529 Male erectile dysfunction, unspecified: Secondary | ICD-10-CM

## 2013-02-03 MED ORDER — FUROSEMIDE 20 MG PO TABS: 20.0000 mg | ORAL_TABLET | Freq: Every day | ORAL | Status: DC

## 2013-02-03 MED ORDER — DILTIAZEM HCL ER COATED BEADS 180 MG PO CP24: 180.0000 mg | ORAL_CAPSULE | Freq: Every day | ORAL | Status: DC

## 2013-02-03 MED ORDER — SILDENAFIL CITRATE 100 MG PO TABS: 50.0000 mg | ORAL_TABLET | Freq: Every day | ORAL | Status: DC | PRN

## 2013-02-03 NOTE — Progress Notes (Signed)
Pre visit review using our clinic review tool, if applicable. No additional management support is needed unless otherwise documented below in the visit note. 

## 2013-02-03 NOTE — Patient Instructions (Signed)
ED - this is likely to be a combination of natural aging and antihypertensive medication. After a quick literature search clearly HCTZ is a culprit drug as is the beta blocker metoprolol. The only contra-indication to the use of ED drugs is the concurrent use of any nitroglycerine related drug. The products have a very good safety profile but a ruinous $$ aspect.   Plan Stop the HCTZ  Stop the metoprolol  Start Furosemide 20 mg once a day  Start diltiazem CD 180 mg once a day.  Rx for Viagra 100 mg - start with 50 mg, go to 100 mg if need.

## 2013-02-03 NOTE — Progress Notes (Signed)
   Subjective:    Patient ID: Jake Simon, male    DOB: 1945/04/12, 67 y.o.   MRN: [id number]  HPI Mr. Eisen presents to discuss ED. He is divorced x 4 years and has embarked on a promising long term relationship. He report erectile dysfunction.  He has no other c/o or problems today.  PMH, FamHx and SocHx reviewed for any changes and relevance.  Current Outpatient Prescriptions on File Prior to Visit  Medication Sig Dispense Refill  . ASMANEX 60 METERED DOSES 220 MCG/INH inhaler USE 2 PUFFS DAILY  1 Inhaler  1  . aspirin 81 MG EC tablet Take 81 mg by mouth daily. 30 min before niacin       . clobetasol ointment (TEMOVATE) 0.05 % Apply topically as needed.      Marland Kitchen gemfibrozil (LOPID) 600 MG tablet TAKE 1 TABLET TWICE DAILY.  180 tablet  3  . losartan (COZAAR) 50 MG tablet TAKE 1 TABLET ONCE DAILY.  90 tablet  3  . sodium chloride (MURO 128) 5 % ophthalmic solution 1 drop as needed.       No current facility-administered medications on file prior to visit.      Review of Systems System review is negative for any constitutional, cardiac, pulmonary, GI or neuro symptoms or complaints other than as described in the HPI.     Objective:   Physical Exam Filed Vitals:   02/03/13 1406  BP: 144/90  Pulse: 85  Temp: 98.4 F (36.9 C)   gen'l - WNWD man in no distress Cor - RRR Pulm - normal respirations Neuro - A&O x 3       Assessment & Plan:

## 2013-02-06 DIAGNOSIS — N529 Male erectile dysfunction, unspecified: Secondary | ICD-10-CM | POA: Insufficient documentation

## 2013-02-06 NOTE — Assessment & Plan Note (Signed)
BP Readings from Last 3 Encounters:  02/03/13 144/90  08/03/12 150/96  06/10/11 144/92   Borderline control on present medications.

## 2013-02-06 NOTE — Assessment & Plan Note (Signed)
Discussed mechanism of action of ED drugs, side effects and contra-indications (which he does not have)  Plan Trial of viagra

## 2013-02-08 ENCOUNTER — Encounter: Payer: Self-pay | Admitting: Internal Medicine

## 2013-02-26 ENCOUNTER — Other Ambulatory Visit: Payer: Self-pay | Admitting: Internal Medicine

## 2013-03-14 ENCOUNTER — Other Ambulatory Visit: Payer: Self-pay

## 2013-03-14 MED ORDER — FUROSEMIDE 20 MG PO TABS: 20.0000 mg | ORAL_TABLET | Freq: Every day | ORAL | Status: DC

## 2013-03-14 MED ORDER — GEMFIBROZIL 600 MG PO TABS: 600.0000 mg | ORAL_TABLET | Freq: Two times a day (BID) | ORAL | Status: DC

## 2013-03-14 MED ORDER — LOSARTAN POTASSIUM 50 MG PO TABS: 50.0000 mg | ORAL_TABLET | Freq: Every day | ORAL | Status: DC

## 2013-03-14 MED ORDER — DILTIAZEM HCL ER COATED BEADS 180 MG PO CP24: 180.0000 mg | ORAL_CAPSULE | Freq: Every day | ORAL | Status: DC

## 2013-03-16 ENCOUNTER — Encounter: Payer: Self-pay | Admitting: Internal Medicine

## 2013-03-16 DIAGNOSIS — IMO0002 Reserved for concepts with insufficient information to code with codable children: Secondary | ICD-10-CM

## 2013-04-19 ENCOUNTER — Encounter: Payer: Self-pay | Admitting: Internal Medicine

## 2013-06-27 ENCOUNTER — Encounter: Payer: Self-pay | Admitting: Internal Medicine

## 2013-06-27 DIAGNOSIS — H538 Other visual disturbances: Secondary | ICD-10-CM

## 2013-09-20 ENCOUNTER — Encounter: Payer: Self-pay | Admitting: Internal Medicine

## 2013-09-20 ENCOUNTER — Other Ambulatory Visit (INDEPENDENT_AMBULATORY_CARE_PROVIDER_SITE_OTHER): Payer: Commercial Managed Care - HMO

## 2013-09-20 ENCOUNTER — Ambulatory Visit (INDEPENDENT_AMBULATORY_CARE_PROVIDER_SITE_OTHER): Payer: Commercial Managed Care - HMO | Admitting: Internal Medicine

## 2013-09-20 VITALS — BP 152/80 | HR 118 | Temp 98.9°F | Wt 174.0 lb

## 2013-09-20 DIAGNOSIS — I10 Essential (primary) hypertension: Secondary | ICD-10-CM

## 2013-09-20 DIAGNOSIS — E785 Hyperlipidemia, unspecified: Secondary | ICD-10-CM

## 2013-09-20 DIAGNOSIS — Z0000 Encounter for general adult medical examination without abnormal findings: Secondary | ICD-10-CM

## 2013-09-20 DIAGNOSIS — Z23 Encounter for immunization: Secondary | ICD-10-CM

## 2013-09-20 DIAGNOSIS — L989 Disorder of the skin and subcutaneous tissue, unspecified: Secondary | ICD-10-CM

## 2013-09-20 DIAGNOSIS — Z136 Encounter for screening for cardiovascular disorders: Secondary | ICD-10-CM

## 2013-09-20 LAB — LIPID PANEL
CHOL/HDL RATIO: 5
Cholesterol: 192 mg/dL (ref 0–200)
HDL: 36.1 mg/dL — AB (ref >39.00)
LDL CALC: 124 mg/dL — AB (ref 0–99)
NonHDL: 155.9
TRIGLYCERIDES: 158 mg/dL — AB (ref 0.0–149.0)
VLDL: 31.6 mg/dL (ref 0.0–40.0)

## 2013-09-20 LAB — BASIC METABOLIC PANEL
BUN: 26 mg/dL — AB (ref 6–23)
CO2: 26 mEq/L (ref 19–32)
CREATININE: 1.8 mg/dL — AB (ref 0.4–1.5)
Calcium: 9.6 mg/dL (ref 8.4–10.5)
Chloride: 101 mEq/L (ref 96–112)
GFR: 40.52 mL/min — AB (ref >60.00)
Glucose, Bld: 104 mg/dL — ABNORMAL HIGH (ref 70–99)
Potassium: 3.9 mEq/L (ref 3.5–5.1)
Sodium: 136 mEq/L (ref 135–145)

## 2013-09-20 LAB — HEPATIC FUNCTION PANEL
ALBUMIN: 4.1 g/dL (ref 3.5–5.2)
ALT: 16 U/L (ref 0–53)
AST: 17 U/L (ref 0–37)
Alkaline Phosphatase: 51 U/L (ref 39–117)
Bilirubin, Direct: 0.3 mg/dL (ref 0.0–0.3)
Total Bilirubin: 2 mg/dL — ABNORMAL HIGH (ref 0.2–1.2)
Total Protein: 7.9 g/dL (ref 6.0–8.3)

## 2013-09-20 MED ORDER — FUROSEMIDE 20 MG PO TABS: 20.0000 mg | ORAL_TABLET | Freq: Every day | ORAL | Status: DC

## 2013-09-20 MED ORDER — DILTIAZEM HCL ER COATED BEADS 180 MG PO CP24: 180.0000 mg | ORAL_CAPSULE | Freq: Every day | ORAL | Status: DC

## 2013-09-20 MED ORDER — LOSARTAN POTASSIUM 50 MG PO TABS: 50.0000 mg | ORAL_TABLET | Freq: Every day | ORAL | Status: DC

## 2013-09-20 MED ORDER — MOMETASONE FUROATE 220 MCG/INH IN AEPB: 2.0000 | INHALATION_SPRAY | Freq: Every day | RESPIRATORY_TRACT | Status: DC

## 2013-09-20 MED ORDER — LOSARTAN POTASSIUM 100 MG PO TABS: 100.0000 mg | ORAL_TABLET | Freq: Every day | ORAL | Status: DC

## 2013-09-20 MED ORDER — GEMFIBROZIL 600 MG PO TABS: 600.0000 mg | ORAL_TABLET | Freq: Two times a day (BID) | ORAL | Status: DC

## 2013-09-20 NOTE — Progress Notes (Signed)
Pre visit review using our clinic review tool, if applicable. No additional management support is needed unless otherwise documented below in the visit note. 

## 2013-09-20 NOTE — Addendum Note (Signed)
Addended by: Sharon Seller B on: 09/20/2013 11:00 AM   Modules accepted: Orders

## 2013-09-20 NOTE — Assessment & Plan Note (Signed)

## 2013-09-20 NOTE — Assessment & Plan Note (Addendum)
Mild uncontrolled, To incr the losartan 100 mg, watch bun/cr, for labs today, f/u bp at home and next visit

## 2013-09-20 NOTE — Patient Instructions (Addendum)
You had the new Prevnar pneumonia shot today  OK to increase the losartan to 100 mg per day  Please continue to monitor your blood pressure, as the goal is to be less than 140/90 consistently  Please continue all other medications as before, and refills have been done if requested.  Please have the pharmacy call with any other refills you may need.  Please continue your efforts at being more active, low cholesterol diet, and weight control.  You are otherwise up to date with prevention measures today.  You will be contacted regarding the referral for: Dr Allyson Sabal  Please keep your appointments with your specialists as you may have planned  Please go to the LAB in the Basement (turn left off the elevator) for the tests to be done today  You will be contacted by phone if any changes need to be made immediately.  Otherwise, you will receive a letter about your results with an explanation, but please check with MyChart first.  Please remember to sign up for MyChart if you have not done so, as this will be important to you in the future with finding out test results, communicating by private email, and scheduling acute appointments online when needed.  Please return in 6 months, or sooner if needed, with Lab testing done 3-5 days before

## 2013-09-20 NOTE — Progress Notes (Signed)
Subjective:    Patient ID: Jake Simon, male    DOB: 1945/10/29, 68 y.o.   MRN: [id number]  HPI Here for wellness and f/u;  Overall doing ok;  Pt denies CP, worsening SOB, DOE, wheezing, orthopnea, PND, worsening LE edema, palpitations, dizziness or syncope.  Pt denies neurological change such as new headache, facial or extremity weakness.  Pt denies polydipsia, polyuria, or low sugar symptoms. Pt states overall good compliance with treatment and medications, good tolerability, and has been trying to follow lower cholesterol diet.  Pt denies worsening depressive symptoms, suicidal ideation or panic. No fever, night sweats, wt loss, loss of appetite, or other constitutional symptoms.  Pt states good ability with ADL's, has low fall risk, home safety reviewed and adequate, no other significant changes in hearing or vision, and only occasionally active with exercise.  Asks to see derm for several moles, not sure if changed.   Past Medical History  Diagnosis Date  . Allergic rhinitis   . Hyperlipidemia   . HTN (hypertension)   . Sleep apnea     moderate  . Vision blurred     epithelial basement membrane dystrophy   Past Surgical History  Procedure Laterality Date  . Tonsillectomy    . Hernia repair    . Dental implants      reports that he has never smoked. He has never used smokeless tobacco. He reports that he drinks alcohol. He reports that he does not use illicit drugs. family history includes Alzheimer's disease in his mother; Arthritis in his sister; Asthma in his father; Breast cancer in his mother; COPD in his father; Cancer in his sister; Mental illness in his mother; Stroke in his father. There is no history of Diabetes, Prostate cancer, Colon cancer, Heart disease, or Hyperlipidemia. No Known Allergies Current Outpatient Prescriptions on File Prior to Visit  Medication Sig Dispense Refill  . aspirin 81 MG EC tablet Take 81 mg by mouth daily. 30 min before niacin       .  clobetasol ointment (TEMOVATE) 0.05 % Apply topically as needed.      . sildenafil (VIAGRA) 100 MG tablet Take 0.5-1 tablets (50-100 mg total) by mouth daily as needed for erectile dysfunction.  3 tablet  11   No current facility-administered medications on file prior to visit.    Review of Systems Constitutional: Negative for increased diaphoresis, other activity, appetite or other siginficant weight change  HENT: Negative for worsening hearing loss, ear pain, facial swelling, mouth sores and neck stiffness.   Eyes: Negative for other worsening pain, redness or visual disturbance.  Respiratory: Negative for shortness of breath and wheezing.   Cardiovascular: Negative for chest pain and palpitations.  Gastrointestinal: Negative for diarrhea, blood in stool, abdominal distention or other pain Genitourinary: Negative for hematuria, flank pain or change in urine volume.  Musculoskeletal: Negative for myalgias or other joint complaints.  Skin: Negative for color change and wound.  Neurological: Negative for syncope and numbness. other than noted Hematological: Negative for adenopathy. or other swelling Psychiatric/Behavioral: Negative for hallucinations, self-injury, decreased concentration or other worsening agitation.      Objective:   Physical Exam BP 152/80  Pulse 118  Temp(Src) 98.9 F (37.2 C) (Oral)  Wt 174 lb (78.926 kg)  SpO2 97% VS noted,  Constitutional: Pt is oriented to person, place, and time. Appears well-developed and well-nourished.  Head: Normocephalic and atraumatic.  Right Ear: External ear normal.  Left Ear: External ear normal.  Nose: Nose  normal.  Mouth/Throat: Oropharynx is clear and moist.  Eyes: Conjunctivae and EOM are normal. Pupils are equal, round, and reactive to light.  Neck: Normal range of motion. Neck supple. No JVD present. No tracheal deviation present.  Cardiovascular: Normal rate, regular rhythm, normal heart sounds and intact distal pulses.     Pulmonary/Chest: Effort normal and breath sounds without rales or wheezing  Abdominal: Soft. Bowel sounds are normal. NT. No HSM  Musculoskeletal: Normal range of motion. Exhibits no edema.  Lymphadenopathy:  Has no cervical adenopathy.  Neurological: Pt is alert and oriented to person, place, and time. Pt has normal reflexes. No cranial nerve deficit. Motor grossly intact Skin: Skin is warm and dry. No rash noted. Several benign appearing moles to back noted Psychiatric:  Has normal mood and affect. Behavior is normal.     Assessment & Plan:

## 2013-09-20 NOTE — Assessment & Plan Note (Signed)
On lopid with elev TG, for lower fat diet, consider change to fenofibrate and/or statin if persistent elev LDL Lab Results  Component Value Date   CHOL 203* 08/04/2012   HDL 33.20* 08/04/2012   LDLDIRECT 121.9 08/04/2012   TRIG 207.0* 08/04/2012   CHOLHDL 6 08/04/2012

## 2013-09-20 NOTE — Assessment & Plan Note (Signed)
Also for referral to Dr Allyson Sabal.derm per pt reqeust

## 2013-09-21 ENCOUNTER — Encounter: Payer: Self-pay | Admitting: Internal Medicine

## 2013-10-05 ENCOUNTER — Other Ambulatory Visit: Payer: Self-pay | Admitting: *Deleted

## 2013-10-05 MED ORDER — LOSARTAN POTASSIUM 100 MG PO TABS: 100.0000 mg | ORAL_TABLET | Freq: Every day | ORAL | Status: DC

## 2013-10-05 NOTE — Telephone Encounter (Signed)
Pt states he need new rx sent on his losartan 100 mg [facility]. MD sent to [place] at last visit. Inform pt will resend to [facility]...[name]

## 2013-10-07 ENCOUNTER — Telehealth: Payer: Self-pay

## 2013-10-07 NOTE — Telephone Encounter (Signed)
[facility] received on 10/05/13 Losartan 100 mg and on 09/30/13 Losartan 50 mg.  Clarify which is his current therapy

## 2013-10-07 NOTE — Telephone Encounter (Signed)
Pharmacy informed.

## 2013-10-07 NOTE — Telephone Encounter (Signed)
The 100 mg only

## 2013-11-19 ENCOUNTER — Encounter: Payer: Self-pay | Admitting: Internal Medicine

## 2013-11-21 MED ORDER — SILDENAFIL CITRATE 20 MG PO TABS: ORAL_TABLET | ORAL | Status: DC

## 2013-12-08 ENCOUNTER — Ambulatory Visit (INDEPENDENT_AMBULATORY_CARE_PROVIDER_SITE_OTHER): Payer: Commercial Managed Care - HMO

## 2013-12-08 DIAGNOSIS — Z23 Encounter for immunization: Secondary | ICD-10-CM

## 2014-01-05 ENCOUNTER — Ambulatory Visit (INDEPENDENT_AMBULATORY_CARE_PROVIDER_SITE_OTHER): Payer: Commercial Managed Care - HMO | Admitting: Internal Medicine

## 2014-01-05 ENCOUNTER — Encounter: Payer: Self-pay | Admitting: Internal Medicine

## 2014-01-05 VITALS — BP 140/90 | HR 85 | Temp 98.4°F | Ht 67.0 in | Wt 172.2 lb

## 2014-01-05 DIAGNOSIS — N528 Other male erectile dysfunction: Secondary | ICD-10-CM

## 2014-01-05 DIAGNOSIS — I10 Essential (primary) hypertension: Secondary | ICD-10-CM

## 2014-01-05 NOTE — Progress Notes (Signed)
Pre visit review using our clinic review tool, if applicable. No additional management support is needed unless otherwise documented below in the visit note. 

## 2014-01-05 NOTE — Patient Instructions (Signed)
Please continue all other medications as before,  Please have the pharmacy call with any other refills you may need.  Please keep your appointments with your specialists as you may have planned

## 2014-01-05 NOTE — Progress Notes (Signed)
Subjective:    Patient ID: Jake Simon, male    DOB: [birth year], 68 y.o.   MRN: [id number]  HPI  Here to f/u with c/o ED, unable to acheive hardness enough to lead to meaningful intercourse.  Has had hx of symptoms in past, has tried med changes with dr Linda Hedges previously to see if this would improve but did not seem to do this.  Has tried viagra twice with a male partner, once with 20 min wait, second with 30 min wait but not successful either time. Has been rx for generic viagra but no opportunity to try, as partner was a bit upset when he mentioned he had a less expensive med to try.  Has been frugal all his life, but she didn't see it that way.  Wondering about cialis or other options. Pt denies chest pain, increased sob or doe, wheezing, orthopnea, PND, increased LE swelling, palpitations, dizziness or syncope.  Pt denies new neurological symptoms such as new headache, or facial or extremity weakness or numbness   Pt denies polydipsia, polyuria,  Past Medical History  Diagnosis Date  . Allergic rhinitis   . Hyperlipidemia   . HTN (hypertension)   . Sleep apnea     moderate  . Vision blurred     epithelial basement membrane dystrophy   Past Surgical History  Procedure Laterality Date  . Tonsillectomy    . Hernia repair    . Dental implants      reports that he has never smoked. He has never used smokeless tobacco. He reports that he drinks alcohol. He reports that he does not use illicit drugs. family history includes Alzheimer's disease in his mother; Arthritis in his sister; Asthma in his father; Breast cancer in his mother; COPD in his father; Cancer in his sister; Mental illness in his mother; Stroke in his father. There is no history of Diabetes, Prostate cancer, Colon cancer, Heart disease, or Hyperlipidemia. No Known Allergies Current Outpatient Prescriptions on File Prior to Visit  Medication Sig Dispense Refill  . aspirin 81 MG EC tablet Take 81 mg by mouth daily. 30  min before niacin     . clobetasol ointment (TEMOVATE) 0.05 % Apply topically as needed.    . diltiazem (CARDIZEM CD) 180 MG 24 hr capsule Take 1 capsule (180 mg total) by mouth daily. 90 capsule 3  . furosemide (LASIX) 20 MG tablet Take 1 tablet (20 mg total) by mouth daily. 90 tablet 3  . gemfibrozil (LOPID) 600 MG tablet Take 1 tablet (600 mg total) by mouth 2 (two) times daily before a meal. 180 tablet 3  . losartan (COZAAR) 100 MG tablet Take 1 tablet (100 mg total) by mouth daily. 90 tablet 3  . mometasone (ASMANEX 60 METERED DOSES) 220 MCG/INH inhaler Inhale 2 puffs into the lungs daily. 1 Inhaler 11  . sildenafil (REVATIO) 20 MG tablet 2-5 tabs daily as needed 90 tablet 11  . sodium chloride (MURO 128) 5 % ophthalmic ointment Place 1 application into both eyes as needed for irritation.     No current facility-administered medications on file prior to visit.   Review of Systems All otherwise neg per pt     Objective:   Physical Exam BP 140/90 mmHg  Pulse 85  Temp(Src) 98.4 F (36.9 C) (Oral)  Ht 5\' 7"  (1.702 m)  Wt 172 lb 4 oz (78.132 kg)  BMI 26.97 kg/m2  SpO2 97% VS noted,  Constitutional: Pt appears well-developed, well-nourished.  HENT:  Head: NCAT.  Right Ear: External ear normal.  Left Ear: External ear normal.  Eyes: . Pupils are equal, round, and reactive to light. Conjunctivae and EOM are normal Neck: Normal range of motion. Neck supple.  Cardiovascular: Normal rate and regular rhythm.   Pulmonary/Chest: Effort normal and breath sounds normal.  Abd:  Soft, NT, ND, + BS Neurological: Pt is alert. Not confused , motor grossly intact Skin: Skin is warm. No rash Psychiatric: Pt behavior is normal. No agitation.     Assessment & Plan:

## 2014-01-07 NOTE — Assessment & Plan Note (Signed)
Not improved with BP med management, encourage OK for trial viagra generic as prescribed, also consider other such as cialis, injections, or penile implant, consider urology referral

## 2014-01-07 NOTE — Assessment & Plan Note (Signed)
stable overall by history and exam, recent data reviewed with pt, and pt to continue medical treatment as before,  to f/u any worsening symptoms or concerns BP Readings from Last 3 Encounters:  01/05/14 140/90  09/20/13 152/80  02/03/13 144/90

## 2014-04-14 ENCOUNTER — Encounter: Payer: Self-pay | Admitting: Gastroenterology

## 2014-04-18 ENCOUNTER — Encounter: Payer: Self-pay | Admitting: Gastroenterology

## 2014-06-05 ENCOUNTER — Ambulatory Visit (AMBULATORY_SURGERY_CENTER): Payer: Self-pay | Admitting: *Deleted

## 2014-06-05 ENCOUNTER — Encounter: Payer: Self-pay | Admitting: Gastroenterology

## 2014-06-05 VITALS — Ht 66.0 in | Wt 170.6 lb

## 2014-06-05 DIAGNOSIS — Z86010 Personal history of colonic polyps: Secondary | ICD-10-CM

## 2014-06-05 MED ORDER — NA SULFATE-K SULFATE-MG SULF 17.5-3.13-1.6 GM/177ML PO SOLN: 1.0000 | Freq: Once | ORAL | Status: DC

## 2014-06-05 NOTE — Progress Notes (Signed)
No egg or soy allergy No diet pills no issues with past sedation, ether made him very sick long ago No home 02 use No constipation issues emmi to e mail

## 2014-06-19 ENCOUNTER — Encounter: Payer: Self-pay | Admitting: Gastroenterology

## 2014-06-19 ENCOUNTER — Ambulatory Visit (AMBULATORY_SURGERY_CENTER): Payer: Commercial Managed Care - HMO | Admitting: Gastroenterology

## 2014-06-19 VITALS — BP 112/71 | HR 74 | Temp 97.3°F | Resp 16 | Ht 66.0 in | Wt 170.0 lb

## 2014-06-19 DIAGNOSIS — K5730 Diverticulosis of large intestine without perforation or abscess without bleeding: Secondary | ICD-10-CM

## 2014-06-19 DIAGNOSIS — Z86010 Personal history of colonic polyps: Secondary | ICD-10-CM

## 2014-06-19 DIAGNOSIS — I10 Essential (primary) hypertension: Secondary | ICD-10-CM | POA: Diagnosis not present

## 2014-06-19 DIAGNOSIS — J45909 Unspecified asthma, uncomplicated: Secondary | ICD-10-CM | POA: Diagnosis not present

## 2014-06-19 DIAGNOSIS — E669 Obesity, unspecified: Secondary | ICD-10-CM | POA: Diagnosis not present

## 2014-06-19 DIAGNOSIS — G4733 Obstructive sleep apnea (adult) (pediatric): Secondary | ICD-10-CM | POA: Diagnosis not present

## 2014-06-19 MED ORDER — SODIUM CHLORIDE 0.9 % IV SOLN: 500.0000 mL | INTRAVENOUS | Status: DC

## 2014-06-19 NOTE — Progress Notes (Signed)
Report to PACU, RN, vss, BBS= Clear.  

## 2014-06-19 NOTE — Patient Instructions (Signed)
YOU HAD AN ENDOSCOPIC PROCEDURE TODAY AT THE [facility]:   Refer to the procedure report that was given to you for any specific questions about what was found during the examination.  If the procedure report does not answer your questions, please call your gastroenterologist to clarify.  If you requested that your care partner not be given the details of your procedure findings, then the procedure report has been included in a sealed envelope for you to review at your convenience later.  YOU SHOULD EXPECT: Some feelings of bloating in the abdomen. Passage of more gas than usual.  Walking can help get rid of the air that was put into your GI tract during the procedure and reduce the bloating. If you had a lower endoscopy (such as a colonoscopy or flexible sigmoidoscopy) you may notice spotting of blood in your stool or on the toilet paper. If you underwent a bowel prep for your procedure, you may not have a normal bowel movement for a few days.  Please Note:  You might notice some irritation and congestion in your nose or some drainage.  This is from the oxygen used during your procedure.  There is no need for concern and it should clear up in a day or so.  SYMPTOMS TO REPORT IMMEDIATELY:   Following lower endoscopy (colonoscopy or flexible sigmoidoscopy):  Excessive amounts of blood in the stool  Significant tenderness or worsening of abdominal pains  Swelling of the abdomen that is new, acute  Fever of 100F or higher   For urgent or emergent issues, a gastroenterologist can be reached at any hour by calling [phone].   DIET: Your first meal following the procedure should be a small meal and then it is ok to progress to your normal diet. Heavy or fried foods are harder to digest and may make you feel nauseous or bloated.  Likewise, meals heavy in dairy and vegetables can increase bloating.  Drink plenty of fluids but you should avoid alcoholic beverages for 24  hours.  ACTIVITY:  You should plan to take it easy for the rest of today and you should NOT DRIVE or use heavy machinery until tomorrow (because of the sedation medicines used during the test).    FOLLOW UP: Our staff will call the number listed on your records the next business day following your procedure to check on you and address any questions or concerns that you may have regarding the information given to you following your procedure. If we do not reach you, we will leave a message.  However, if you are feeling well and you are not experiencing any problems, there is no need to return our call.  We will assume that you have returned to your regular daily activities without incident.  If any biopsies were taken you will be contacted by phone or by letter within the next 1-3 weeks.  Please call us at [phone] if you have not heard about the biopsies in 3 weeks.    SIGNATURES/CONFIDENTIALITY: You and/or your care partner have signed paperwork which will be entered into your electronic medical record.  These signatures attest to the fact that that the information above on your After Visit Summary has been reviewed and is understood.  Full responsibility of the confidentiality of this discharge information lies with you and/or your care-partner. 

## 2014-06-19 NOTE — Op Note (Signed)
[facility]  [address] [place], [address]   COLONOSCOPY PROCEDURE REPORT  PATIENT: Jake Simon, Jake Simon  MR#: [id number] BIRTHDATE: 01-Nov-1945 , 76  yrs. old GENDER: male ENDOSCOPIST: Inda Castle, MD REFERRED KL:KJZPH John, M.D. PROCEDURE DATE:  06/19/2014 PROCEDURE:   Colonoscopy, surveillance First Screening Colonoscopy - Avg.  risk and is 50 yrs.  old or older - No.  Prior Negative Screening - Now for repeat screening. Other: See Comments  History of Adenoma - Now for follow-up colonoscopy & has been > or = to 3 yrs.  Yes hx of adenoma.  Has been 3 or more years since last colonoscopy. ASA CLASS:   Class II INDICATIONS:PH Colon Adenoma. 2004; 2009 colonoscopy negative for polyps MEDICATIONS: Monitored anesthesia care, Propofol 200 mg IV, and lidocaine 150 mg IV  DESCRIPTION OF PROCEDURE:   After the risks benefits and alternatives of the procedure were thoroughly explained, informed consent was obtained.  The digital rectal exam revealed no abnormalities of the rectum.   The LB CF-H180AL Loaner [id number] endoscope was introduced through the anus and advanced to the cecum, which was identified by both the appendix and ileocecal valve. No adverse events experienced.   The quality of the prep was (Suprep was used) excellent.  The instrument was then slowly withdrawn as the colon was fully examined.      COLON FINDINGS: There was mild diverticulosis noted in the sigmoid colon.   The examination was otherwise normal.  Retroflexed views revealed no abnormalities. The time to cecum = 4.7 Withdrawal time = 6.2   The scope was withdrawn and the procedure completed. COMPLICATIONS: There were no immediate complications.  ENDOSCOPIC IMPRESSION: 1.   Mild diverticulosis was noted in the sigmoid colon 2.   The examination was otherwise normal  RECOMMENDATIONS: Colonoscopy 10 years  eSigned:  Inda Castle, MD 06/19/2014 11:09 AM   cc:

## 2014-06-20 ENCOUNTER — Telehealth: Payer: Self-pay | Admitting: *Deleted

## 2014-06-20 NOTE — Telephone Encounter (Signed)
  Follow up Call-  Call back number 06/19/2014  Post procedure Call Back phone  # [phone]  Permission to leave phone message Yes     Patient questions:  Do you have a fever, pain , or abdominal swelling? No. Pain Score  0 *  Have you tolerated food without any problems? Yes.    Have you been able to return to your normal activities? Yes.    Do you have any questions about your discharge instructions: Diet   No. Medications  No. Follow up visit  No.  Do you have questions or concerns about your Care? No.  Actions: * If pain score is 4 or above: No action needed, pain <4.

## 2014-07-18 DIAGNOSIS — H04123 Dry eye syndrome of bilateral lacrimal glands: Secondary | ICD-10-CM | POA: Diagnosis not present

## 2014-07-18 DIAGNOSIS — H43813 Vitreous degeneration, bilateral: Secondary | ICD-10-CM | POA: Diagnosis not present

## 2014-07-18 DIAGNOSIS — D3131 Benign neoplasm of right choroid: Secondary | ICD-10-CM | POA: Diagnosis not present

## 2014-07-18 DIAGNOSIS — H2513 Age-related nuclear cataract, bilateral: Secondary | ICD-10-CM | POA: Diagnosis not present

## 2014-07-18 DIAGNOSIS — H1859 Other hereditary corneal dystrophies: Secondary | ICD-10-CM | POA: Diagnosis not present

## 2014-07-18 DIAGNOSIS — H10413 Chronic giant papillary conjunctivitis, bilateral: Secondary | ICD-10-CM | POA: Diagnosis not present

## 2014-09-05 ENCOUNTER — Encounter: Payer: Self-pay | Admitting: Gastroenterology

## 2014-10-12 ENCOUNTER — Encounter: Payer: Self-pay | Admitting: Internal Medicine

## 2014-10-12 ENCOUNTER — Ambulatory Visit (INDEPENDENT_AMBULATORY_CARE_PROVIDER_SITE_OTHER): Payer: Commercial Managed Care - HMO | Admitting: Internal Medicine

## 2014-10-12 ENCOUNTER — Other Ambulatory Visit (INDEPENDENT_AMBULATORY_CARE_PROVIDER_SITE_OTHER): Payer: Commercial Managed Care - HMO

## 2014-10-12 ENCOUNTER — Other Ambulatory Visit: Payer: Commercial Managed Care - HMO

## 2014-10-12 VITALS — BP 116/74 | HR 88 | Temp 98.2°F | Ht 66.0 in | Wt 174.0 lb

## 2014-10-12 DIAGNOSIS — H903 Sensorineural hearing loss, bilateral: Secondary | ICD-10-CM

## 2014-10-12 DIAGNOSIS — E785 Hyperlipidemia, unspecified: Secondary | ICD-10-CM

## 2014-10-12 DIAGNOSIS — J309 Allergic rhinitis, unspecified: Secondary | ICD-10-CM | POA: Diagnosis not present

## 2014-10-12 DIAGNOSIS — I10 Essential (primary) hypertension: Secondary | ICD-10-CM | POA: Diagnosis not present

## 2014-10-12 LAB — LIPID PANEL
Cholesterol: 219 mg/dL — ABNORMAL HIGH (ref 0–200)
HDL: 31.7 mg/dL — AB (ref >39.00)
NONHDL: 187.36
Total CHOL/HDL Ratio: 7
Triglycerides: 325 mg/dL — ABNORMAL HIGH (ref 0.0–149.0)
VLDL: 65 mg/dL — ABNORMAL HIGH (ref 0.0–40.0)

## 2014-10-12 LAB — CBC WITH DIFFERENTIAL/PLATELET
BASOS PCT: 0.4 % (ref 0.0–3.0)
Basophils Absolute: 0 10*3/uL (ref 0.0–0.1)
EOS PCT: 0 % (ref 0.0–5.0)
Eosinophils Absolute: 0 10*3/uL (ref 0.0–0.7)
HEMATOCRIT: 44.3 % (ref 39.0–52.0)
HEMOGLOBIN: 15.3 g/dL (ref 13.0–17.0)
LYMPHS ABS: 1.6 10*3/uL (ref 0.7–4.0)
Lymphocytes Relative: 19.5 % (ref 12.0–46.0)
MCHC: 34.5 g/dL (ref 30.0–36.0)
MCV: 88.9 fl (ref 78.0–100.0)
Monocytes Absolute: 0.9 10*3/uL (ref 0.1–1.0)
Monocytes Relative: 10.9 % (ref 3.0–12.0)
Neutro Abs: 5.7 10*3/uL (ref 1.4–7.7)
Neutrophils Relative %: 69.2 % (ref 43.0–77.0)
Platelets: 215 10*3/uL (ref 150.0–400.0)
RBC: 4.99 Mil/uL (ref 4.22–5.81)
RDW: 13.6 % (ref 11.5–15.5)
WBC: 8.2 10*3/uL (ref 4.0–10.5)

## 2014-10-12 LAB — URINALYSIS, ROUTINE W REFLEX MICROSCOPIC
BILIRUBIN URINE: NEGATIVE
HGB URINE DIPSTICK: NEGATIVE
KETONES UR: NEGATIVE
LEUKOCYTES UA: NEGATIVE
Nitrite: NEGATIVE
Specific Gravity, Urine: 1.025 (ref 1.000–1.030)
Total Protein, Urine: NEGATIVE
URINE GLUCOSE: NEGATIVE
UROBILINOGEN UA: 0.2 (ref 0.0–1.0)
pH: 5.5 (ref 5.0–8.0)

## 2014-10-12 LAB — BASIC METABOLIC PANEL
BUN: 33 mg/dL — AB (ref 6–23)
CO2: 25 mEq/L (ref 19–32)
CREATININE: 1.8 mg/dL — AB (ref 0.40–1.50)
Calcium: 9.4 mg/dL (ref 8.4–10.5)
Chloride: 102 mEq/L (ref 96–112)
GFR: 39.87 mL/min — ABNORMAL LOW (ref >60.00)
GLUCOSE: 99 mg/dL (ref 70–99)
Potassium: 4 mEq/L (ref 3.5–5.1)
Sodium: 137 mEq/L (ref 135–145)

## 2014-10-12 LAB — HEPATIC FUNCTION PANEL
ALBUMIN: 4.5 g/dL (ref 3.5–5.2)
ALT: 13 U/L (ref 0–53)
AST: 14 U/L (ref 0–37)
Alkaline Phosphatase: 49 U/L (ref 39–117)
Bilirubin, Direct: 0.1 mg/dL (ref 0.0–0.3)
Total Bilirubin: 0.8 mg/dL (ref 0.2–1.2)
Total Protein: 7.7 g/dL (ref 6.0–8.3)

## 2014-10-12 LAB — LDL CHOLESTEROL, DIRECT: Direct LDL: 117 mg/dL

## 2014-10-12 LAB — TSH: TSH: 1.42 u[IU]/mL (ref 0.35–4.50)

## 2014-10-12 MED ORDER — TRIAMCINOLONE ACETONIDE 55 MCG/ACT NA AERO: 2.0000 | INHALATION_SPRAY | Freq: Every day | NASAL | Status: DC

## 2014-10-12 MED ORDER — LEVOCETIRIZINE DIHYDROCHLORIDE 5 MG PO TABS: 5.0000 mg | ORAL_TABLET | Freq: Every day | ORAL | Status: DC | PRN

## 2014-10-12 MED ORDER — GEMFIBROZIL 600 MG PO TABS: 600.0000 mg | ORAL_TABLET | Freq: Two times a day (BID) | ORAL | Status: DC

## 2014-10-12 MED ORDER — LOSARTAN POTASSIUM 100 MG PO TABS: 100.0000 mg | ORAL_TABLET | Freq: Every day | ORAL | Status: DC

## 2014-10-12 MED ORDER — DILTIAZEM HCL ER COATED BEADS 180 MG PO CP24: 180.0000 mg | ORAL_CAPSULE | Freq: Every day | ORAL | Status: DC

## 2014-10-12 MED ORDER — MOMETASONE FUROATE 220 MCG/INH IN AEPB: 2.0000 | INHALATION_SPRAY | Freq: Every day | RESPIRATORY_TRACT | Status: DC

## 2014-10-12 MED ORDER — FUROSEMIDE 20 MG PO TABS
20.0000 mg | ORAL_TABLET | Freq: Every day | ORAL | Status: DC
Start: 2014-10-12 — End: 2015-09-23

## 2014-10-12 NOTE — Assessment & Plan Note (Signed)
stable overall by history and exam, recent data reviewed with pt, and pt to continue medical treatment as before,  to f/u any worsening symptoms or concerns., for f/u labs Lab Results  Component Value Date   WBC 6.6 06/26/2010   HGB 16.3 06/26/2010   HCT 46.4 06/26/2010   PLT 202.0 06/26/2010   GLUCOSE 104* 09/20/2013   CHOL 192 09/20/2013   TRIG 158.0* 09/20/2013   HDL 36.10* 09/20/2013   LDLDIRECT 121.9 08/04/2012   LDLCALC 124* 09/20/2013   ALT 16 09/20/2013   AST 17 09/20/2013   NA 136 09/20/2013   K 3.9 09/20/2013   CL 101 09/20/2013   CREATININE 1.8* 09/20/2013   BUN 26* 09/20/2013   CO2 26 09/20/2013   TSH 1.82 05/18/2009   PSA 0.60 05/18/2009

## 2014-10-12 NOTE — Assessment & Plan Note (Signed)
Mild to mod, for zyrtec/nasacort,  to f/u any worsening symptoms or concerns

## 2014-10-12 NOTE — Progress Notes (Signed)
Pre visit review using our clinic review tool, if applicable. No additional management support is needed unless otherwise documented below in the visit note. 

## 2014-10-12 NOTE — Patient Instructions (Addendum)
Your ears were irrigated of wax today  Please take all new medication as prescribed - the zyrtec and nasacort as directed  Please continue all other medications as before, and refills have been done if requested.  Please have the pharmacy call with any other refills you may need.  Please continue your efforts at being more active, low cholesterol diet, and weight control.  Please keep your appointments with your specialists as you may have planned  Please go to the LAB in the Basement (turn left off the elevator) for the tests to be done today  You will be contacted by phone if any changes need to be made immediately.  Otherwise, you will receive a letter about your results with an explanation, but please check with MyChart first.  Please return in 6 months, or sooner if needed

## 2014-10-12 NOTE — Progress Notes (Signed)
   Subjective:    Patient ID: Jake Simon, male    DOB: 12-01-1945, 69 y.o.   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  But Does have several wks ongoing nasal allergy symptoms with clearish congestion, itch and sneezing, without fever, pain, ST, cough, swelling or wheezing.  Pt denies new neurological symptoms such as new headache, or facial or extremity weakness or numbness   Past Medical History  Diagnosis Date  . Allergic rhinitis   . HTN (hypertension)   . Sleep apnea     moderate  . Vision blurred     epithelial basement membrane dystrophy  . Hyperlipidemia     high triglycerides  . Asthma    Past Surgical History  Procedure Laterality Date  . Tonsillectomy    . Hernia repair    . Dental implants    . Colonoscopy    . Polypectomy      reports that he has never smoked. He has never used smokeless tobacco. He reports that he drinks alcohol. He reports that he does not use illicit drugs. family history includes Alzheimer's disease in his mother; Arthritis in his sister; Asthma in his father; Breast cancer in his mother; COPD in his father; Cancer in his sister; Mental illness in his mother; Stroke in his father. There is no history of Diabetes, Prostate cancer, Colon cancer, Heart disease, Hyperlipidemia, Rectal cancer, or Stomach cancer. No Known Allergies Current Outpatient Prescriptions on File Prior to Visit  Medication Sig Dispense Refill  . aspirin 81 MG EC tablet Take 81 mg by mouth daily. 30 min before niacin     . clobetasol ointment (TEMOVATE) 0.05 % Apply topically as needed.    . Na Sulfate-K Sulfate-Mg Sulf (SUPREP BOWEL PREP) SOLN Take 1 kit by mouth once. suprep as directed. No substitutions 354 mL 0  . sildenafil (REVATIO) 20 MG tablet 2-5 tabs daily as  needed 90 tablet 11  . sodium chloride (MURO 128) 5 % ophthalmic ointment Place 1 application into both eyes as needed for irritation.     No current facility-administered medications on file prior to visit.   Review of Systems     Objective:   Physical Exam        Assessment & Plan:

## 2014-10-12 NOTE — Assessment & Plan Note (Signed)
Likely wax impaction related, now improved,  to f/u any worsening symptoms or concerns

## 2014-10-12 NOTE — Assessment & Plan Note (Signed)
stable overall by history and exam, recent data reviewed with pt, and pt to continue medical treatment as before,  to f/u any worsening symptoms or concerns BP Readings from Last 3 Encounters:  10/12/14 116/74  06/19/14 112/71  01/05/14 140/90

## 2014-11-13 ENCOUNTER — Encounter: Payer: Self-pay | Admitting: Internal Medicine

## 2014-11-14 ENCOUNTER — Ambulatory Visit: Payer: Commercial Managed Care - HMO

## 2014-11-16 ENCOUNTER — Ambulatory Visit (INDEPENDENT_AMBULATORY_CARE_PROVIDER_SITE_OTHER): Payer: Commercial Managed Care - HMO

## 2014-11-16 ENCOUNTER — Ambulatory Visit: Payer: Commercial Managed Care - HMO | Admitting: Internal Medicine

## 2014-11-16 DIAGNOSIS — Z23 Encounter for immunization: Secondary | ICD-10-CM

## 2015-02-11 ENCOUNTER — Emergency Department (HOSPITAL_COMMUNITY): Payer: Commercial Managed Care - HMO

## 2015-02-11 ENCOUNTER — Observation Stay (HOSPITAL_COMMUNITY)
Admission: EM | Admit: 2015-02-11 | Discharge: 2015-02-12 | Disposition: A | Discharge status: Home / Self Care | Payer: Commercial Managed Care - HMO | Attending: Family Medicine | Admitting: Family Medicine

## 2015-02-11 ENCOUNTER — Encounter (HOSPITAL_COMMUNITY): Payer: Self-pay | Admitting: Emergency Medicine

## 2015-02-11 DIAGNOSIS — R0789 Other chest pain: Principal | ICD-10-CM | POA: Insufficient documentation

## 2015-02-11 DIAGNOSIS — R079 Chest pain, unspecified: Secondary | ICD-10-CM | POA: Diagnosis not present

## 2015-02-11 DIAGNOSIS — I10 Essential (primary) hypertension: Secondary | ICD-10-CM | POA: Diagnosis not present

## 2015-02-11 DIAGNOSIS — Z79899 Other long term (current) drug therapy: Secondary | ICD-10-CM | POA: Insufficient documentation

## 2015-02-11 DIAGNOSIS — E785 Hyperlipidemia, unspecified: Secondary | ICD-10-CM | POA: Insufficient documentation

## 2015-02-11 DIAGNOSIS — N189 Chronic kidney disease, unspecified: Secondary | ICD-10-CM | POA: Insufficient documentation

## 2015-02-11 DIAGNOSIS — J45909 Unspecified asthma, uncomplicated: Secondary | ICD-10-CM | POA: Diagnosis not present

## 2015-02-11 DIAGNOSIS — Z7982 Long term (current) use of aspirin: Secondary | ICD-10-CM | POA: Insufficient documentation

## 2015-02-11 DIAGNOSIS — K8020 Calculus of gallbladder without cholecystitis without obstruction: Secondary | ICD-10-CM | POA: Insufficient documentation

## 2015-02-11 DIAGNOSIS — G4733 Obstructive sleep apnea (adult) (pediatric): Secondary | ICD-10-CM | POA: Diagnosis not present

## 2015-02-11 DIAGNOSIS — I129 Hypertensive chronic kidney disease with stage 1 through stage 4 chronic kidney disease, or unspecified chronic kidney disease: Secondary | ICD-10-CM | POA: Diagnosis not present

## 2015-02-11 DIAGNOSIS — R1013 Epigastric pain: Secondary | ICD-10-CM | POA: Diagnosis not present

## 2015-02-11 LAB — HEPATIC FUNCTION PANEL
ALBUMIN: 4.5 g/dL (ref 3.5–5.0)
ALT: 17 U/L (ref 17–63)
AST: 23 U/L (ref 15–41)
Alkaline Phosphatase: 54 U/L (ref 38–126)
Bilirubin, Direct: 0.2 mg/dL (ref 0.1–0.5)
Indirect Bilirubin: 1.1 mg/dL — ABNORMAL HIGH (ref 0.3–0.9)
TOTAL PROTEIN: 7.9 g/dL (ref 6.5–8.1)
Total Bilirubin: 1.3 mg/dL — ABNORMAL HIGH (ref 0.3–1.2)

## 2015-02-11 LAB — BASIC METABOLIC PANEL
ANION GAP: 14 (ref 5–15)
BUN: 30 mg/dL — ABNORMAL HIGH (ref 6–20)
CALCIUM: 9.6 mg/dL (ref 8.9–10.3)
CO2: 26 mmol/L (ref 22–32)
Chloride: 100 mmol/L — ABNORMAL LOW (ref 101–111)
Creatinine, Ser: 1.8 mg/dL — ABNORMAL HIGH (ref 0.61–1.24)
GFR, EST AFRICAN AMERICAN: 42 mL/min — AB (ref >60)
GFR, EST NON AFRICAN AMERICAN: 36 mL/min — AB (ref >60)
Glucose, Bld: 147 mg/dL — ABNORMAL HIGH (ref 65–99)
POTASSIUM: 3.7 mmol/L (ref 3.5–5.1)
Sodium: 140 mmol/L (ref 135–145)

## 2015-02-11 LAB — LIPASE, BLOOD: LIPASE: 43 U/L (ref 11–51)

## 2015-02-11 LAB — CBC
HEMATOCRIT: 43.2 % (ref 39.0–52.0)
HEMOGLOBIN: 15.3 g/dL (ref 13.0–17.0)
MCH: 31.5 pg (ref 26.0–34.0)
MCHC: 35.4 g/dL (ref 30.0–36.0)
MCV: 88.9 fL (ref 78.0–100.0)
Platelets: 228 10*3/uL (ref 150–400)
RBC: 4.86 MIL/uL (ref 4.22–5.81)
RDW: 13.1 % (ref 11.5–15.5)
WBC: 8.4 10*3/uL (ref 4.0–10.5)

## 2015-02-11 LAB — TROPONIN I

## 2015-02-11 MED ORDER — GEMFIBROZIL 600 MG PO TABS
600.0000 mg | ORAL_TABLET | Freq: Two times a day (BID) | ORAL | Status: DC
  Administered 2015-02-12: 600 mg via ORAL
  Filled 2015-02-11: qty 1

## 2015-02-11 MED ORDER — FUROSEMIDE 20 MG PO TABS
20.0000 mg | ORAL_TABLET | Freq: Every day | ORAL | Status: DC
  Administered 2015-02-12: 20 mg via ORAL
  Filled 2015-02-11: qty 1

## 2015-02-11 MED ORDER — HYDROMORPHONE HCL 1 MG/ML IJ SOLN
0.5000 mg | INTRAMUSCULAR | Status: DC | PRN
Start: 2015-02-11 — End: 2015-02-12

## 2015-02-11 MED ORDER — SODIUM CHLORIDE 0.9 % IJ SOLN
3.0000 mL | Freq: Two times a day (BID) | INTRAMUSCULAR | Status: DC
  Administered 2015-02-11 – 2015-02-12 (×2): 3 mL via INTRAVENOUS

## 2015-02-11 MED ORDER — ONDANSETRON HCL 4 MG/2ML IJ SOLN
4.0000 mg | Freq: Once | INTRAMUSCULAR | Status: AC
  Administered 2015-02-11: 4 mg via INTRAVENOUS
  Filled 2015-02-11: qty 2

## 2015-02-11 MED ORDER — ACETAMINOPHEN 650 MG RE SUPP: 650.0000 mg | Freq: Four times a day (QID) | RECTAL | Status: DC | PRN

## 2015-02-11 MED ORDER — OXYCODONE HCL 5 MG PO TABS: 5.0000 mg | ORAL_TABLET | ORAL | Status: DC | PRN

## 2015-02-11 MED ORDER — SODIUM CHLORIDE 0.9 % IV SOLN: 250.0000 mL | INTRAVENOUS | Status: DC | PRN

## 2015-02-11 MED ORDER — DILTIAZEM HCL ER COATED BEADS 180 MG PO CP24
180.0000 mg | ORAL_CAPSULE | Freq: Every day | ORAL | Status: DC
  Administered 2015-02-11 – 2015-02-12 (×2): 180 mg via ORAL
  Filled 2015-02-11 (×2): qty 1

## 2015-02-11 MED ORDER — SODIUM CHLORIDE 0.9 % IJ SOLN: 3.0000 mL | INTRAMUSCULAR | Status: DC | PRN

## 2015-02-11 MED ORDER — ALUM & MAG HYDROXIDE-SIMETH 200-200-20 MG/5ML PO SUSP
30.0000 mL | Freq: Four times a day (QID) | ORAL | Status: DC | PRN
Start: 2015-02-11 — End: 2015-02-12

## 2015-02-11 MED ORDER — NITROGLYCERIN 0.4 MG SL SUBL
0.4000 mg | SUBLINGUAL_TABLET | SUBLINGUAL | Status: DC | PRN
  Administered 2015-02-11 (×3): 0.4 mg via SUBLINGUAL
  Filled 2015-02-11 (×2): qty 1

## 2015-02-11 MED ORDER — ASPIRIN 81 MG PO CHEW
324.0000 mg | CHEWABLE_TABLET | Freq: Once | ORAL | Status: AC
  Administered 2015-02-11: 324 mg via ORAL
  Filled 2015-02-11: qty 4

## 2015-02-11 MED ORDER — ACETAMINOPHEN 325 MG PO TABS: 650.0000 mg | ORAL_TABLET | Freq: Four times a day (QID) | ORAL | Status: DC | PRN

## 2015-02-11 MED ORDER — ONDANSETRON HCL 4 MG PO TABS: 4.0000 mg | ORAL_TABLET | Freq: Four times a day (QID) | ORAL | Status: DC | PRN

## 2015-02-11 MED ORDER — ONDANSETRON HCL 4 MG/2ML IJ SOLN: 4.0000 mg | Freq: Four times a day (QID) | INTRAMUSCULAR | Status: DC | PRN

## 2015-02-11 MED ORDER — ASPIRIN EC 325 MG PO TBEC
325.0000 mg | DELAYED_RELEASE_TABLET | Freq: Every day | ORAL | Status: DC
Start: 2015-02-11 — End: 2015-02-12
  Administered 2015-02-11 – 2015-02-12 (×2): 325 mg via ORAL
  Filled 2015-02-11 (×2): qty 1

## 2015-02-11 MED ORDER — MORPHINE SULFATE (PF) 4 MG/ML IV SOLN
4.0000 mg | Freq: Once | INTRAVENOUS | Status: DC
  Filled 2015-02-11: qty 1

## 2015-02-11 MED ORDER — ENOXAPARIN SODIUM 40 MG/0.4ML ~~LOC~~ SOLN
40.0000 mg | Freq: Every day | SUBCUTANEOUS | Status: DC
  Administered 2015-02-11: 40 mg via SUBCUTANEOUS
  Filled 2015-02-11: qty 0.4

## 2015-02-11 MED ORDER — NITROGLYCERIN 2 % TD OINT
0.5000 [in_us] | TOPICAL_OINTMENT | Freq: Four times a day (QID) | TRANSDERMAL | Status: DC
  Administered 2015-02-11 – 2015-02-12 (×3): 0.5 [in_us] via TOPICAL
  Filled 2015-02-11: qty 30

## 2015-02-11 MED ORDER — LOSARTAN POTASSIUM 50 MG PO TABS
100.0000 mg | ORAL_TABLET | Freq: Every day | ORAL | Status: DC
  Administered 2015-02-12: 100 mg via ORAL
  Filled 2015-02-11: qty 2

## 2015-02-11 NOTE — ED Provider Notes (Signed)
CSN: [id number]     Arrival date & time 02/11/15  1440 History   First MD Initiated Contact with Patient 02/11/15 1521     Chief Complaint  Patient presents with  . Chest Pain     (Consider location/radiation/quality/duration/timing/severity/associated sxs/prior Treatment) HPI Comments: Epigastric, lower chest pain, feeling of indigestion/ache. Lower substernal chest pain. No radiation Started after lunch Didn't notice difference walking, nonexertional Not positional/pleuritic Vomited several times Nausea Last Wednesday night had similar symptoms in middle of night, took nausea medicine and improved Feels slight fever/chill No diarrhea/constipation 2-3/10  No hx DM/smoking No early MI hx i family, 2 uncles had MIs    Patient is a 69 y.o. male presenting with chest pain.  Chest Pain Associated symptoms: fever, nausea and vomiting   Associated symptoms: no abdominal pain, no back pain, no cough, no headache and no shortness of breath     Past Medical History  Diagnosis Date  . Allergic rhinitis   . HTN (hypertension)   . Sleep apnea     moderate  . Vision blurred     epithelial basement membrane dystrophy  . Hyperlipidemia     high triglycerides  . Asthma    Past Surgical History  Procedure Laterality Date  . Tonsillectomy    . Hernia repair    . Dental implants    . Colonoscopy    . Polypectomy     Family History  Problem Relation Age of Onset  . Alzheimer's disease Mother   . Breast cancer Mother   . Mental illness Mother     alzheimers  . COPD Father   . Asthma Father   . Stroke Father   . Diabetes Neg Hx   . Prostate cancer Neg Hx   . Colon cancer Neg Hx   . Heart disease Neg Hx   . Hyperlipidemia Neg Hx   . Rectal cancer Neg Hx   . Stomach cancer Neg Hx   . Cancer Sister     brain cancer  . Arthritis Sister    Social History  Substance Use Topics  . Smoking status: Never Smoker   . Smokeless tobacco: Never Used  . Alcohol Use: Yes   Comment: occasional beer, wine    Review of Systems  Constitutional: Positive for fever and chills.  HENT: Negative for sore throat.   Eyes: Negative for visual disturbance.  Respiratory: Negative for cough and shortness of breath.   Cardiovascular: Positive for chest pain.  Gastrointestinal: Positive for nausea and vomiting. Negative for abdominal pain.  Genitourinary: Negative for difficulty urinating.  Musculoskeletal: Negative for back pain and neck stiffness.  Skin: Negative for rash.  Neurological: Negative for syncope and headaches.      Allergies  Review of patient's allergies indicates no known allergies.  Home Medications   Prior to Admission medications   Medication Sig Start Date End Date Taking? Authorizing Provider  aspirin 81 MG EC tablet Take 81 mg by mouth at bedtime. 30 min before niacin   Yes Historical Provider, MD  diltiazem (CARDIZEM CD) 180 MG 24 hr capsule Take 1 capsule (180 mg total) by mouth daily. 10/12/14  Yes Biagio Borg, MD  furosemide (LASIX) 20 MG tablet Take 1 tablet (20 mg total) by mouth daily. 10/12/14  Yes Biagio Borg, MD  gemfibrozil (LOPID) 600 MG tablet Take 1 tablet (600 mg total) by mouth 2 (two) times daily before a meal. 10/12/14  Yes Biagio Borg, MD  losartan (COZAAR) 100 MG  tablet Take 1 tablet (100 mg total) by mouth daily. 10/12/14  Yes Biagio Borg, MD  mometasone (ASMANEX 60 METERED DOSES) 220 MCG/INH inhaler Inhale 2 puffs into the lungs daily. Patient taking differently: Inhale 2 puffs into the lungs daily as needed (for shortness of breath).  10/12/14  Yes Biagio Borg, MD  sildenafil (REVATIO) 20 MG tablet 2-5 tabs daily as needed 11/21/13  Yes Biagio Borg, MD  sodium chloride (MURO 128) 5 % ophthalmic ointment Place 1 application into both eyes as needed for irritation.   Yes Historical Provider, MD  levocetirizine (XYZAL) 5 MG tablet Take 1 tablet (5 mg total) by mouth daily as needed for allergies. Patient not taking: Reported  on 02/11/2015 10/12/14   Biagio Borg, MD  Na Sulfate-K Sulfate-Mg Sulf (SUPREP BOWEL PREP) SOLN Take 1 kit by mouth once. suprep as directed. No substitutions Patient not taking: Reported on 02/11/2015 06/05/14   Inda Castle, MD  triamcinolone (NASACORT AQ) 55 MCG/ACT AERO nasal inhaler Place 2 sprays into the nose daily. Patient not taking: Reported on 02/11/2015 10/12/14   Biagio Borg, MD   BP 157/64 mmHg  Pulse 96  Temp(Src) 98.3 F (36.8 C) (Oral)  Resp 24  Ht _0  (1.676 m)  Wt 169 lb 12.8 oz (77.021 kg)  BMI 27.42 kg/m2  SpO2 98% Physical Exam  Constitutional: He is oriented to person, place, and time. He appears well-developed and well-nourished. No distress.  HENT:  Head: Normocephalic and atraumatic.  Eyes: Conjunctivae and EOM are normal.  Neck: Normal range of motion.  Cardiovascular: Normal rate, regular rhythm, normal heart sounds and intact distal pulses.  Exam reveals no gallop and no friction rub.   No murmur heard. Pulmonary/Chest: Effort normal and breath sounds normal. No respiratory distress. He has no wheezes. He has no rales.  Abdominal: Soft. He exhibits no distension. There is tenderness (mild right upper quadrant). There is no guarding, no tenderness at McBurney's point and negative Murphy's sign.  Musculoskeletal: He exhibits no edema.  Neurological: He is alert and oriented to person, place, and time.  Skin: Skin is warm and dry. He is not diaphoretic.  Nursing note and vitals reviewed.   ED Course  Procedures (including critical care time) Labs Review Labs Reviewed  BASIC METABOLIC PANEL - Abnormal; Notable for the following:    Chloride 100 (*)    Glucose, Bld 147 (*)    BUN 30 (*)    Creatinine, Ser 1.80 (*)    GFR calc non Af Amer 36 (*)    GFR calc Af Amer 42 (*)    All other components within normal limits  HEPATIC FUNCTION PANEL - Abnormal; Notable for the following:    Total Bilirubin 1.3 (*)    Indirect Bilirubin 1.1 (*)    All  other components within normal limits  CBC  TROPONIN I  LIPASE, BLOOD  TROPONIN I  LIPID PANEL  BASIC METABOLIC PANEL  CBC  TROPONIN I  TROPONIN I    Imaging Review Dg Chest 2 View  02/11/2015  CLINICAL DATA:  Chest pain for 1 day EXAM: CHEST  2 VIEW COMPARISON:  None. FINDINGS: Lungs are clear. Heart size and pulmonary vascularity are normal. No adenopathy. No bone lesions. Calcification in the right upper quadrant appears to represent laminated gallstone. IMPRESSION: No edema or consolidation. Apparent laminated gallstone right upper quadrant. Electronically Signed   By: Lowella Grip III M.D.   On: 02/11/2015 15:17  US Abdomen Limited Ruq  02/11/2015  CLINICAL DATA:  Right upper quadrant pain EXAM: US ABDOMEN LIMITED - RIGHT UPPER QUADRANT COMPARISON:  None. FINDINGS: Gallbladder: Well distended with a focal gallstone within the gallbladder neck. It appears mobile on different positioning Common bile duct: Diameter: 2.7 mm. Liver: No focal lesion identified. Within normal limits in parenchymal echogenicity. IMPRESSION: Cholelithiasis without complicating factors. Electronically Signed   By: Inez Catalina M.D.   On: 02/11/2015 17:29   I have personally reviewed and evaluated these images and lab results as part of my medical decision-making.   EKG Interpretation   Date/Time:  Sunday February 11 2015 14:48:23 EST Ventricular Rate:  62 PR Interval:  153 QRS Duration: 102 QT Interval:  399 QTC Calculation: 405 R Axis:   28 Text Interpretation:  Sinus rhythm No previous ECGs available Confirmed by  [facility] MD, Aseneth Hack ([id number]) on 02/11/2015 3:31:29 PM      MDM   Final diagnoses:  Epigastric pain   69 year old male with a history of hypertension and hyperlipidemia presents with concern of chest pain.  Given mild right upper quadrant tenderness on exam, history of pain beginning after eating, and Gholson seen on chest x-ray, right upper quadrant ultrasound was ordered to  evaluate for cholecystitis. Ultrasound shows cholelithiasis without signs of cholecystitis, transaminases and lipase are within normal limits. Recommend outpatient follow-up for cholelithiasis.  Given patient describes his pain the lower part of his chest/substernal, and does not have significant abdominal symptoms, will proceed with a chest pain evaluation. EKG showed no acute ST changes and no prior EKGs to compare to. Troponin was negative. Low suspicion for pulmonary embolus or aortic dissection by history and physical exam. Patient is a high risk heart score and will admit to hospitalist, Dr. Arnoldo Morale, for further evaluation for chest pain. Gareth Morgan, MD 02/12/15 [phone]

## 2015-02-11 NOTE — ED Notes (Signed)
Pt c/o epigastric "indigestion" type pain and multiple episodes of emesis onset today "around lunchtime." Pt diaphoretic, denies SOB.

## 2015-02-11 NOTE — ED Notes (Signed)
Pt requested pain medicine.  Obtained order for pain medicine and just prior to administering Morphine, pt states pain had disappeared. Pain med held.

## 2015-02-11 NOTE — H&P (Signed)
[facility] Admission History and Physical       Jake Simon VEH:[id number] DOB: June 04, 1945 DOA: 02/11/2015  Referring physician: EDP PCP: Jake Cower, MD  Specialists:   Chief Complaint: Chest and ABD Pain with Nausea and Vomiting  HPI: Jake Simon is a 69 y.o. male with a history of HTN, and Hyperlipidemia who presents to the ED with complaints substernal chest pain and epigastric pain described as indigestion like associated with nausea and vomiting since the afternoon.   He rates the discomfort at a 3/10.    He had similar fleeting discomfort 2 days ago.    He was evaluated in the ED and had Chest X-ray and an ABD [place] which revealed a gallstone, his LFTs and Lipase were within normal limits, and  He was not found to have any signs of cholecystitis on imaging or a Murphy's sign.  He was referred for a cardiac rule out.      Review of Systems:  Constitutional: No Weight Loss, No Weight Gain, Night Sweats, Fevers, Chills, Dizziness, Light Headedness, Fatigue, or Generalized Weakness HEENT: No Headaches, Difficulty Swallowing,Tooth/Dental Problems,Sore Throat,  No Sneezing, Rhinitis, Ear Ache, Nasal Congestion, or Post Nasal Drip,  Cardio-vascular:  +Chest pain, Orthopnea, PND, Edema in Lower Extremities, Anasarca, Dizziness, Palpitations  Resp: No Dyspnea, No DOE, No Productive Cough, No Non-Productive Cough, No Hemoptysis, No Wheezing.    GI: No Heartburn, +Indigestion, +Abdominal Pain, Nausea, Vomiting, Diarrhea, Constipation, Hematemesis, Hematochezia, Melena, Change in Bowel Habits,  Loss of Appetite  GU: No Dysuria, No Change in Color of Urine, No Urgency or Urinary Frequency, No Flank pain.  Musculoskeletal: No Joint Pain or Swelling, No Decreased Range of Motion, No Back Pain.  Neurologic: No Syncope, No Seizures, Muscle Weakness, Paresthesia, Vision Disturbance or Loss, No Diplopia, No Vertigo, No Difficulty Walking,  Skin: No Rash or Lesions. Psych: No Change  in Mood or Affect, No Depression or Anxiety, No Memory loss, No Confusion, or Hallucinations   Past Medical History  Diagnosis Date  . Allergic rhinitis   . HTN (hypertension)   . Sleep apnea     moderate  . Vision blurred     epithelial basement membrane dystrophy  . Hyperlipidemia     high triglycerides  . Asthma      Past Surgical History  Procedure Laterality Date  . Tonsillectomy    . Hernia repair    . Dental implants    . Colonoscopy    . Polypectomy        Prior to Admission medications   Medication Sig Start Date End Date Taking? Authorizing Provider  aspirin 81 MG EC tablet Take 81 mg by mouth at bedtime. 30 min before niacin   Yes Historical Provider, MD  diltiazem (CARDIZEM CD) 180 MG 24 hr capsule Take 1 capsule (180 mg total) by mouth daily. 10/12/14  Yes Biagio Borg, MD  furosemide (LASIX) 20 MG tablet Take 1 tablet (20 mg total) by mouth daily. 10/12/14  Yes Biagio Borg, MD  gemfibrozil (LOPID) 600 MG tablet Take 1 tablet (600 mg total) by mouth 2 (two) times daily before a meal. 10/12/14  Yes Biagio Borg, MD  losartan (COZAAR) 100 MG tablet Take 1 tablet (100 mg total) by mouth daily. 10/12/14  Yes Biagio Borg, MD  mometasone (ASMANEX 60 METERED DOSES) 220 MCG/INH inhaler Inhale 2 puffs into the lungs daily. Patient taking differently: Inhale 2 puffs into the lungs daily as needed (for shortness of breath).  10/12/14  Yes Biagio Borg, MD  sildenafil (REVATIO) 20 MG tablet 2-5 tabs daily as needed 11/21/13  Yes Biagio Borg, MD  sodium chloride (MURO 128) 5 % ophthalmic ointment Place 1 application into both eyes as needed for irritation.   Yes Historical Provider, MD  levocetirizine (XYZAL) 5 MG tablet Take 1 tablet (5 mg total) by mouth daily as needed for allergies. Patient not taking: Reported on 02/11/2015 10/12/14   Biagio Borg, MD  Na Sulfate-K Sulfate-Mg Sulf (SUPREP BOWEL PREP) SOLN Take 1 kit by mouth once. suprep as directed. No substitutions Patient  not taking: Reported on 02/11/2015 06/05/14   Inda Castle, MD  triamcinolone (NASACORT AQ) 55 MCG/ACT AERO nasal inhaler Place 2 sprays into the nose daily. Patient not taking: Reported on 02/11/2015 10/12/14   Biagio Borg, MD     No Known Allergies    Social History:  reports that he has never smoked. He has never used smokeless tobacco. He reports that he drinks alcohol. He reports that he does not use illicit drugs.     Family History  Problem Relation Age of Onset  . Alzheimer's disease Mother   . Breast cancer Mother   . Mental illness Mother     alzheimers  . COPD Father   . Asthma Father   . Stroke Father   . Diabetes Neg Hx   . Prostate cancer Neg Hx   . Colon cancer Neg Hx   . Heart disease Neg Hx   . Hyperlipidemia Neg Hx   . Rectal cancer Neg Hx   . Stomach cancer Neg Hx   . Cancer Sister     brain cancer  . Arthritis Sister        Physical Exam:  GEN:  Pleasant Well Nourished and Well Developed  69 y.o. male examined and in no acute distress; cooperative with exam Filed Vitals:   02/11/15 1930 02/11/15 1945 02/11/15 2015 02/11/15 2030  BP: 127/65 104/71 106/93 118/67  Pulse:      Temp:      Resp: _0 SpO2: 98% 99% 98% 100%   Blood pressure 118/67, pulse 68, temperature 97.5 F (36.4 C), resp. rate 24, SpO2 100 %. PSYCH: He is alert and oriented x4; does not appear anxious does not appear depressed; affect is normal HEENT: Normocephalic and Atraumatic, Mucous membranes pink; PERRLA; EOM intact; Fundi:  Benign;  No scleral icterus, Nares: Patent, Oropharynx: Clear, Fair Dentition,    Neck:  FROM, No Cervical Lymphadenopathy nor Thyromegaly or Carotid Bruit; No JVD; Breasts:: Not examined CHEST WALL: No tenderness CHEST: Normal respiration, clear to auscultation bilaterally HEART: Regular rate and rhythm; no murmurs rubs or gallops BACK: No kyphosis or scoliosis; No CVA tenderness ABDOMEN: Positive Bowel Sounds, Soft Non-Tender, No Rebound or  Guarding; No Masses, No Organomegaly. Rectal Exam: Not done EXTREMITIES: No Cyanosis, Clubbing, or Edema; No Ulcerations. Genitalia: not examined PULSES: 2+ and symmetric SKIN: Normal hydration no rash or ulceration CNS:  Alert and Oriented x 4, No Focal Deficits Vascular: pulses palpable throughout    Labs on Admission:  Basic Metabolic Panel:  Recent Labs Lab 02/11/15 1451  NA 140  K 3.7  CL 100*  CO2 26  GLUCOSE 147*  BUN 30*  CREATININE 1.80*  CALCIUM 9.6   Liver Function Tests:  Recent Labs Lab 02/11/15 1451  AST 23  ALT 17  ALKPHOS 54  BILITOT 1.3*  PROT 7.9  ALBUMIN 4.5  Recent Labs Lab 02/11/15 1451  LIPASE 43   No results for input(s): AMMONIA in the last 168 hours. CBC:  Recent Labs Lab 02/11/15 1451  WBC 8.4  HGB 15.3  HCT 43.2  MCV 88.9  PLT 228   Cardiac Enzymes:  Recent Labs Lab 02/11/15 1451  TROPONINI <0.03    BNP (last 3 results) No results for input(s): BNP in the last 8760 hours.  ProBNP (last 3 results) No results for input(s): PROBNP in the last 8760 hours.  CBG: No results for input(s): GLUCAP in the last 168 hours.  Radiological Exams on Admission: Dg Chest 2 View  02/11/2015  CLINICAL DATA:  Chest pain for 1 day EXAM: CHEST  2 VIEW COMPARISON:  None. FINDINGS: Lungs are clear. Heart size and pulmonary vascularity are normal. No adenopathy. No bone lesions. Calcification in the right upper quadrant appears to represent laminated gallstone. IMPRESSION: No edema or consolidation. Apparent laminated gallstone right upper quadrant. Electronically Signed   By: Lowella Grip III M.D.   On: 02/11/2015 15:17   US Abdomen Limited Ruq  02/11/2015  CLINICAL DATA:  Right upper quadrant pain EXAM: US ABDOMEN LIMITED - RIGHT UPPER QUADRANT COMPARISON:  None. FINDINGS: Gallbladder: Well distended with a focal gallstone within the gallbladder neck. It appears mobile on different positioning Common bile duct: Diameter: 2.7 mm.  Liver: No focal lesion identified. Within normal limits in parenchymal echogenicity. IMPRESSION: Cholelithiasis without complicating factors. Electronically Signed   By: Inez Catalina M.D.   On: 02/11/2015 17:29     EKG: Independently reviewed. Normal Sinus Rhythm Rate = 62 No Acute S-T changes        Assessment/Plan:     69 y.o. male with  Active Problems:   1.     Atypical chest pain   Telemetry Monitoring   Cycle Troponins   Nitropaste, O2, ASA   2D ECHO in AM   Check Fasting Lipds in AM      2.     Epigastric ABD Pain- Incidental Finding of Cholelithiasis-Non-Obstructive on CXR, and ABD [place]   Pain Control PRN   Monitor LFTs     3.     Hyperlipidemia   On Gemfibrozil Rx   Check Lipids in AM     4.     Essential hypertension   On Diltiazem, Losartan, and Furosemide Rx   Monitor BPs     5.     DVT Prophylaxis    Lovenox     Code Status:     FULL CODE        Family Communication:   Wife at Bedside Disposition Plan:   Observation Status        Time spent:  58 Minutes      Theressa Millard [facility] Pager [phone]   If [place] Please Contact the Day Rounding Team MD for [facility]  If 7PM-7AM, Please Contact Night-Floor Coverage  [email or web address] Password [email or web address] 02/11/2015, 9:02 PM     ADDENDUM:   Patient was seen and examined on 02/11/2015

## 2015-02-12 ENCOUNTER — Observation Stay (HOSPITAL_BASED_OUTPATIENT_CLINIC_OR_DEPARTMENT_OTHER): Payer: Commercial Managed Care - HMO

## 2015-02-12 DIAGNOSIS — Z79899 Other long term (current) drug therapy: Secondary | ICD-10-CM | POA: Diagnosis not present

## 2015-02-12 DIAGNOSIS — Z7982 Long term (current) use of aspirin: Secondary | ICD-10-CM | POA: Diagnosis not present

## 2015-02-12 DIAGNOSIS — J45909 Unspecified asthma, uncomplicated: Secondary | ICD-10-CM | POA: Diagnosis not present

## 2015-02-12 DIAGNOSIS — R079 Chest pain, unspecified: Secondary | ICD-10-CM

## 2015-02-12 DIAGNOSIS — E785 Hyperlipidemia, unspecified: Secondary | ICD-10-CM | POA: Diagnosis not present

## 2015-02-12 DIAGNOSIS — K8020 Calculus of gallbladder without cholecystitis without obstruction: Secondary | ICD-10-CM | POA: Diagnosis not present

## 2015-02-12 DIAGNOSIS — R1013 Epigastric pain: Secondary | ICD-10-CM | POA: Diagnosis not present

## 2015-02-12 DIAGNOSIS — N189 Chronic kidney disease, unspecified: Secondary | ICD-10-CM | POA: Diagnosis not present

## 2015-02-12 DIAGNOSIS — R0789 Other chest pain: Secondary | ICD-10-CM

## 2015-02-12 DIAGNOSIS — I129 Hypertensive chronic kidney disease with stage 1 through stage 4 chronic kidney disease, or unspecified chronic kidney disease: Secondary | ICD-10-CM | POA: Diagnosis not present

## 2015-02-12 LAB — CBC
HCT: 37 % — ABNORMAL LOW (ref 39.0–52.0)
HEMOGLOBIN: 12.9 g/dL — AB (ref 13.0–17.0)
MCH: 30.4 pg (ref 26.0–34.0)
MCHC: 34.9 g/dL (ref 30.0–36.0)
MCV: 87.3 fL (ref 78.0–100.0)
Platelets: 197 10*3/uL (ref 150–400)
RBC: 4.24 MIL/uL (ref 4.22–5.81)
RDW: 13.1 % (ref 11.5–15.5)
WBC: 11.8 10*3/uL — AB (ref 4.0–10.5)

## 2015-02-12 LAB — BASIC METABOLIC PANEL
ANION GAP: 9 (ref 5–15)
BUN: 32 mg/dL — ABNORMAL HIGH (ref 6–20)
CALCIUM: 8.5 mg/dL — AB (ref 8.9–10.3)
CHLORIDE: 103 mmol/L (ref 101–111)
CO2: 24 mmol/L (ref 22–32)
Creatinine, Ser: 1.71 mg/dL — ABNORMAL HIGH (ref 0.61–1.24)
GFR calc non Af Amer: 39 mL/min — ABNORMAL LOW (ref >60)
GFR, EST AFRICAN AMERICAN: 45 mL/min — AB (ref >60)
Glucose, Bld: 93 mg/dL (ref 65–99)
Potassium: 3.5 mmol/L (ref 3.5–5.1)
SODIUM: 136 mmol/L (ref 135–145)

## 2015-02-12 LAB — LIPID PANEL
CHOLESTEROL: 183 mg/dL (ref 0–200)
HDL: 31 mg/dL — ABNORMAL LOW (ref >40)
LDL Cholesterol: 126 mg/dL — ABNORMAL HIGH (ref 0–99)
Total CHOL/HDL Ratio: 5.9 RATIO
Triglycerides: 128 mg/dL (ref <150)
VLDL: 26 mg/dL (ref 0–40)

## 2015-02-12 LAB — TROPONIN I: Troponin I: <0.03 ng/mL (ref <0.031)

## 2015-02-12 MED ORDER — ALUM & MAG HYDROXIDE-SIMETH 200-200-20 MG/5ML PO SUSP: 30.0000 mL | Freq: Four times a day (QID) | ORAL | Status: DC | PRN

## 2015-02-12 MED ORDER — LOSARTAN POTASSIUM 100 MG PO TABS: 50.0000 mg | ORAL_TABLET | Freq: Every day | ORAL | Status: DC

## 2015-02-12 MED ORDER — OXYCODONE HCL 5 MG PO TABS: 5.0000 mg | ORAL_TABLET | ORAL | Status: DC | PRN

## 2015-02-12 MED ORDER — ONDANSETRON HCL 4 MG PO TABS: 4.0000 mg | ORAL_TABLET | Freq: Four times a day (QID) | ORAL | Status: DC | PRN

## 2015-02-12 MED ORDER — ASPIRIN 325 MG PO TBEC: 325.0000 mg | DELAYED_RELEASE_TABLET | Freq: Every day | ORAL | Status: DC

## 2015-02-12 NOTE — Progress Notes (Signed)
Patient A&Ox4, ambulatory was discharged to home. Questions, concerns r/t dc instruction were denied. Pt had all belongings. Wife at bedside and pt request to ambulate out.

## 2015-02-12 NOTE — Progress Notes (Signed)
Echocardiogram 2D Echocardiogram has been performed.  Jake Simon, Jake Simon 02/12/2015, 9:02 AM

## 2015-02-12 NOTE — Progress Notes (Deleted)
Physician Discharge Summary  Jake Simon EQA:[id number] DOB: May 09, 1945 DOA: 02/11/2015  PCP: Cathlean Cower, MD  Admit date: 02/11/2015 Discharge date: 02/12/2015  Time spent: 35 minutes  Recommendations for Outpatient Follow-up:  1. Needs OP re-eval of creatinine    Discharge Diagnoses:  Active Problems:   Hyperlipidemia   Essential hypertension   Atypical chest pain   Discharge Condition: good  Diet recommendation: hh low salt  Filed Weights   02/11/15 2112  Weight: 77.021 kg (169 lb 12.8 oz)    History of present illness:  69 y/o ? Known mild diverticulitis based on colonoscopy 06/2014 Mild OSA Admitted 02/11/15 for Substernal CP Noted patient had some spoilt milk the night before and had numerous epiosdes of n/v stopping prior to coming to ED Troponin x 3 neg Echo EF65-70% without WM anomally  Noted CKD and counselled to cut back losartan to 50 mg daily Stable for d/c tolerating diet and d/c home for 1-2 week f/u c PCP    Discharge Exam: Filed Vitals:   02/12/15 0653 02/12/15 1102  BP: 102/49 113/60  Pulse:  72  Temp:    Resp:     Well alert oriented and in nad  General: eomim ncat Cardiovascular: s1 s 2no m/r/g Respiratory: clear no added sound  Discharge Instructions   Discharge Instructions    Diet - low sodium heart healthy    Complete by:  As directed      Discharge instructions    Complete by:  As directed   Half the amount of losartan that you take You need follow up for kidney dysfucntion See your regular MD in 1-2 weeks     Increase activity slowly    Complete by:  As directed           Current Discharge Medication List    START taking these medications   Details  alum & mag hydroxide-simeth (MAALOX/MYLANTA) 200-200-20 MG/5ML suspension Take 30 mLs by mouth every 6 (six) hours as needed for indigestion or heartburn (dyspepsia). Qty: 355 mL, Refills: 0    !! aspirin EC 325 MG EC tablet Take 1 tablet (325 mg total) by mouth  daily. Qty: 30 tablet, Refills: 0    ondansetron (ZOFRAN) 4 MG tablet Take 1 tablet (4 mg total) by mouth every 6 (six) hours as needed for nausea. Qty: 20 tablet, Refills: 0    oxyCODONE (OXY IR/ROXICODONE) 5 MG immediate release tablet Take 1 tablet (5 mg total) by mouth every 4 (four) hours as needed for moderate pain. Qty: 30 tablet, Refills: 0     !! - Potential duplicate medications found. Please discuss with provider.    CONTINUE these medications which have CHANGED   Details  losartan (COZAAR) 100 MG tablet Take 0.5 tablets (50 mg total) by mouth daily. Qty: 90 tablet, Refills: 3      CONTINUE these medications which have NOT CHANGED   Details  !! aspirin 81 MG EC tablet Take 81 mg by mouth at bedtime. 30 min before niacin    diltiazem (CARDIZEM CD) 180 MG 24 hr capsule Take 1 capsule (180 mg total) by mouth daily. Qty: 90 capsule, Refills: 3    furosemide (LASIX) 20 MG tablet Take 1 tablet (20 mg total) by mouth daily. Qty: 90 tablet, Refills: 3    gemfibrozil (LOPID) 600 MG tablet Take 1 tablet (600 mg total) by mouth 2 (two) times daily before a meal. Qty: 180 tablet, Refills: 3    mometasone (ASMANEX 60 METERED  DOSES) 220 MCG/INH inhaler Inhale 2 puffs into the lungs daily. Qty: 1 Inhaler, Refills: 11    sildenafil (REVATIO) 20 MG tablet 2-5 tabs daily as needed Qty: 90 tablet, Refills: 11    sodium chloride (MURO 128) 5 % ophthalmic ointment Place 1 application into both eyes as needed for irritation.    levocetirizine (XYZAL) 5 MG tablet Take 1 tablet (5 mg total) by mouth daily as needed for allergies. Qty: 90 tablet, Refills: 3    Na Sulfate-K Sulfate-Mg Sulf (SUPREP BOWEL PREP) SOLN Take 1 kit by mouth once. suprep as directed. No substitutions Qty: 354 mL, Refills: 0   Associated Diagnoses: Hx of colonic polyps    triamcinolone (NASACORT AQ) 55 MCG/ACT AERO nasal inhaler Place 2 sprays into the nose daily. Qty: 1 Inhaler, Refills: 12     !! -  Potential duplicate medications found. Please discuss with provider.     No Known Allergies    The results of significant diagnostics from this hospitalization (including imaging, microbiology, ancillary and laboratory) are listed below for reference.    Significant Diagnostic Studies: Dg Chest 2 View  02/11/2015  CLINICAL DATA:  Chest pain for 1 day EXAM: CHEST  2 VIEW COMPARISON:  None. FINDINGS: Lungs are clear. Heart size and pulmonary vascularity are normal. No adenopathy. No bone lesions. Calcification in the right upper quadrant appears to represent laminated gallstone. IMPRESSION: No edema or consolidation. Apparent laminated gallstone right upper quadrant. Electronically Signed   By: Lowella Grip III M.D.   On: 02/11/2015 15:17   US Abdomen Limited Ruq  02/11/2015  CLINICAL DATA:  Right upper quadrant pain EXAM: US ABDOMEN LIMITED - RIGHT UPPER QUADRANT COMPARISON:  None. FINDINGS: Gallbladder: Well distended with a focal gallstone within the gallbladder neck. It appears mobile on different positioning Common bile duct: Diameter: 2.7 mm. Liver: No focal lesion identified. Within normal limits in parenchymal echogenicity. IMPRESSION: Cholelithiasis without complicating factors. Electronically Signed   By: Inez Catalina M.D.   On: 02/11/2015 17:29    Microbiology: No results found for this or any previous visit (from the past 240 hour(s)).   Labs: Basic Metabolic Panel:  Recent Labs Lab 02/11/15 1451 02/12/15 0356  NA 140 136  K 3.7 3.5  CL 100* 103  CO2 26 24  GLUCOSE 147* 93  BUN 30* 32*  CREATININE 1.80* 1.71*  CALCIUM 9.6 8.5*   Liver Function Tests:  Recent Labs Lab 02/11/15 1451  AST 23  ALT 17  ALKPHOS 54  BILITOT 1.3*  PROT 7.9  ALBUMIN 4.5    Recent Labs Lab 02/11/15 1451  LIPASE 43   No results for input(s): AMMONIA in the last 168 hours. CBC:  Recent Labs Lab 02/11/15 1451 02/12/15 0356  WBC 8.4 11.8*  HGB 15.3 12.9*  HCT 43.2  37.0*  MCV 88.9 87.3  PLT 228 197   Cardiac Enzymes:  Recent Labs Lab 02/11/15 1451 02/11/15 2220 02/12/15 0356 02/12/15 0850  TROPONINI <0.03 <0.03 <0.03 <0.03   BNP: BNP (last 3 results) No results for input(s): BNP in the last 8760 hours.  ProBNP (last 3 results) No results for input(s): PROBNP in the last 8760 hours.  CBG: No results for input(s): GLUCAP in the last 168 hours.     SignedNita Sells  [facility] 02/12/2015, 2:21 PM

## 2015-02-12 NOTE — Progress Notes (Addendum)
Patient assessed this am. BP 98/48 , 70, 97% ra.  Pt reports taking his home medication (Gemfibrozil) from pill box and reports having done so since admission. Discussed hospital policy/procedure regarding home medications. Pt made aware medications would need to be sent down to the pharmacy for administration. Pt reports not having Pill bottles only pill box from home. Pt encouraged to hold BP medications due to low BP until f/u with provider has been completed. Will contact provider.

## 2015-02-20 ENCOUNTER — Encounter: Payer: Self-pay | Admitting: Internal Medicine

## 2015-02-20 ENCOUNTER — Ambulatory Visit (INDEPENDENT_AMBULATORY_CARE_PROVIDER_SITE_OTHER): Payer: Commercial Managed Care - HMO | Admitting: Internal Medicine

## 2015-02-20 VITALS — BP 130/78 | HR 79 | Temp 98.1°F | Ht 66.0 in | Wt 172.0 lb

## 2015-02-20 DIAGNOSIS — I10 Essential (primary) hypertension: Secondary | ICD-10-CM | POA: Diagnosis not present

## 2015-02-20 DIAGNOSIS — E785 Hyperlipidemia, unspecified: Secondary | ICD-10-CM | POA: Diagnosis not present

## 2015-02-20 DIAGNOSIS — N183 Chronic kidney disease, stage 3 unspecified: Secondary | ICD-10-CM | POA: Insufficient documentation

## 2015-02-20 DIAGNOSIS — Z0000 Encounter for general adult medical examination without abnormal findings: Secondary | ICD-10-CM

## 2015-02-20 MED ORDER — FENOFIBRATE 160 MG PO TABS: 160.0000 mg | ORAL_TABLET | Freq: Every day | ORAL | Status: DC

## 2015-02-20 NOTE — Progress Notes (Signed)
Subjective:    Patient ID: Jake Simon, male    DOB: [birth year], 69 y.o.   MRN: [id number]  HPI  Here to f/u recent ED visit and overnight admit -  69 y/o ? Known mild diverticulitis based on colonoscopy 06/2014 Mild OSA Admitted 02/11/15 for Substernal CP; abd u/s and cxr neg for acute, did have noted gallstone Noted patient had some spilt milk the night before and had numerous epiosdes of n/v stopping prior to coming to ED, did also have high fat content to diet he admits for several days prior to onset of Gi symtpoms Troponin x 3 neg Echo EF65-70% without WM anomally  1. Noted CKD and counselled to cut back losartan to 50 mg daily Needs OP re-eval of creatinine;  Pt denies chest pain, increased sob or doe, wheezing, orthopnea, PND, increased LE swelling, palpitations, dizziness or syncope.  Pt denies new neurological symptoms such as new headache, or facial or extremity weakness or numbness   Pt denies polydipsia, polyuria. Denies worsening reflux, abd pain, dysphagia, n/v, bowel change or blood.   BP Readings from Last 3 Encounters:  02/20/15 130/78  02/12/15 113/60  10/12/14 116/74   Past Medical History  Diagnosis Date  . Allergic rhinitis   . HTN (hypertension)   . Sleep apnea     moderate  . Vision blurred     epithelial basement membrane dystrophy  . Hyperlipidemia     high triglycerides  . Asthma    Past Surgical History  Procedure Laterality Date  . Tonsillectomy    . Hernia repair    . Dental implants    . Colonoscopy    . Polypectomy      reports that he has never smoked. He has never used smokeless tobacco. He reports that he drinks alcohol. He reports that he does not use illicit drugs. family history includes Alzheimer's disease in his mother; Arthritis in his sister; Asthma in his father; Breast cancer in his mother; COPD in his father; Cancer in his sister; Mental illness in his mother; Stroke in his father. There is no history of Diabetes, Prostate  cancer, Colon cancer, Heart disease, Hyperlipidemia, Rectal cancer, or Stomach cancer. No Known Allergies Current Outpatient Prescriptions on File Prior to Visit  Medication Sig Dispense Refill  . alum & mag hydroxide-simeth (MAALOX/MYLANTA) 200-200-20 MG/5ML suspension Take 30 mLs by mouth every 6 (six) hours as needed for indigestion or heartburn (dyspepsia). 355 mL 0  . aspirin 81 MG EC tablet Take 81 mg by mouth at bedtime. 30 min before niacin    . diltiazem (CARDIZEM CD) 180 MG 24 hr capsule Take 1 capsule (180 mg total) by mouth daily. 90 capsule 3  . furosemide (LASIX) 20 MG tablet Take 1 tablet (20 mg total) by mouth daily. 90 tablet 3  . gemfibrozil (LOPID) 600 MG tablet Take 1 tablet (600 mg total) by mouth 2 (two) times daily before a meal. 180 tablet 3  . losartan (COZAAR) 100 MG tablet Take 0.5 tablets (50 mg total) by mouth daily. 90 tablet 3  . mometasone (ASMANEX 60 METERED DOSES) 220 MCG/INH inhaler Inhale 2 puffs into the lungs daily. (Patient taking differently: Inhale 2 puffs into the lungs daily as needed (for shortness of breath). ) 1 Inhaler 11  . sildenafil (REVATIO) 20 MG tablet 2-5 tabs daily as needed 90 tablet 11  . sodium chloride (MURO 128) 5 % ophthalmic ointment Place 1 application into both eyes as needed for irritation.    Marland Kitchen  levocetirizine (XYZAL) 5 MG tablet Take 1 tablet (5 mg total) by mouth daily as needed for allergies. (Patient not taking: Reported on 02/20/2015) 90 tablet 3  . Na Sulfate-K Sulfate-Mg Sulf (SUPREP BOWEL PREP) SOLN Take 1 kit by mouth once. suprep as directed. No substitutions (Patient not taking: Reported on 02/20/2015) 354 mL 0  . ondansetron (ZOFRAN) 4 MG tablet Take 1 tablet (4 mg total) by mouth every 6 (six) hours as needed for nausea. (Patient not taking: Reported on 02/20/2015) 20 tablet 0  . oxyCODONE (OXY IR/ROXICODONE) 5 MG immediate release tablet Take 1 tablet (5 mg total) by mouth every 4 (four) hours as needed for moderate pain.  (Patient not taking: Reported on 02/20/2015) 30 tablet 0  . triamcinolone (NASACORT AQ) 55 MCG/ACT AERO nasal inhaler Place 2 sprays into the nose daily. (Patient not taking: Reported on 02/20/2015) 1 Inhaler 12   No current facility-administered medications on file prior to visit.   Review of Systems  Constitutional: Negative for unusual diaphoresis or night sweats HENT: Negative for ringing in ear or discharge Eyes: Negative for double vision or worsening visual disturbance.  Respiratory: Negative for choking and stridor.   Gastrointestinal: Negative for vomiting or other signifcant bowel change Genitourinary: Negative for hematuria or change in urine volume.  Musculoskeletal: Negative for other MSK pain or swelling Skin: Negative for color change and worsening wound.  Neurological: Negative for tremors and numbness other than noted  Psychiatric/Behavioral: Negative for decreased concentration or agitation other than above       Objective:   Physical Exam BP 130/78 mmHg  Pulse 79  Temp(Src) 98.1 F (36.7 C) (Oral)  Ht '5\' 6"'  (1.676 m)  Wt 172 lb (78.019 kg)  BMI 27.77 kg/m2  SpO2 96% VS noted,  Constitutional: Pt appears in no significant distress HENT: Head: NCAT.  Right Ear: External ear normal.  Left Ear: External ear normal.  Eyes: . Pupils are equal, round, and reactive to light. Conjunctivae and EOM are normal Neck: Normal range of motion. Neck supple.  Cardiovascular: Normal rate and regular rhythm.   Pulmonary/Chest: Effort normal and breath sounds without rales or wheezing.  Abd:  Soft, NT, ND, + BS Neurological: Pt is alert. Not confused , motor grossly intact Skin: Skin is warm. No rash, no LE edema Psychiatric: Pt behavior is normal. No agitation.     Assessment & Plan:

## 2015-02-20 NOTE — Patient Instructions (Addendum)
OK to stop the generic Lopid  Please take all new medication as prescribed - the fenofibrate 160 mg per day  Please have the pharmacy call with recommendation for similar if this particular one is not approved by your insurance (It was sent to Mail order pharmacy)  Please continue all other medications as before, including the Losartan 50 mg for now  Please have the pharmacy call with any other refills you may need.  Please continue your efforts at being more active, low cholesterol diet, and weight control.  Please keep your appointments with your specialists as you may have planned  Please go to the LAB in the Basement (turn left off the elevator) for the tests to be done tomorrow or as you are able  You will be contacted by phone if any changes need to be made immediately.   If the kidney function does not improve, we should consider increasing the losartan back to the 100 mg, as your Blood Pressure would be improved.   Otherwise, you will receive a letter about your results with an explanation, but please check with MyChart first.  Please remember to sign up for MyChart if you have not done so, as this will be important to you in the future with finding out test results, communicating by private email, and scheduling acute appointments online when needed.  Please return in 3 months, or sooner if needed, with Lab testing done 3-5 days before

## 2015-02-20 NOTE — Progress Notes (Signed)
Pre visit review using our clinic review tool, if applicable. No additional management support is needed unless otherwise documented below in the visit note. 

## 2015-02-21 ENCOUNTER — Other Ambulatory Visit (INDEPENDENT_AMBULATORY_CARE_PROVIDER_SITE_OTHER): Payer: Commercial Managed Care - HMO

## 2015-02-21 ENCOUNTER — Other Ambulatory Visit: Payer: Self-pay | Admitting: Internal Medicine

## 2015-02-21 DIAGNOSIS — N183 Chronic kidney disease, stage 3 unspecified: Secondary | ICD-10-CM

## 2015-02-21 LAB — BASIC METABOLIC PANEL
BUN: 36 mg/dL — AB (ref 6–23)
CALCIUM: 9.6 mg/dL (ref 8.4–10.5)
CO2: 26 mEq/L (ref 19–32)
CREATININE: 1.79 mg/dL — AB (ref 0.40–1.50)
Chloride: 102 mEq/L (ref 96–112)
GFR: 40.09 mL/min — AB (ref >60.00)
Glucose, Bld: 105 mg/dL — ABNORMAL HIGH (ref 70–99)
Potassium: 4.8 mEq/L (ref 3.5–5.1)
Sodium: 137 mEq/L (ref 135–145)

## 2015-02-21 MED ORDER — LOSARTAN POTASSIUM 100 MG PO TABS: 50.0000 mg | ORAL_TABLET | Freq: Every day | ORAL | Status: DC

## 2015-02-26 NOTE — Assessment & Plan Note (Signed)
Ok to cont losartan at 50 qd for now, re-check bun/cr and consider increased back to 100 qd for renoprotection and BP if cr not appreciably changed, o/w stable overall by history and exam, recent data reviewed with pt, and pt to continue medical treatment as before,  to f/u any worsening symptoms or concerns BP Readings from Last 3 Encounters:  02/20/15 130/78  02/12/15 113/60  10/12/14 116/74

## 2015-02-26 NOTE — Assessment & Plan Note (Signed)
Ok to change to fenofibrate 160 as current med tx no longer covered by insurance

## 2015-02-26 NOTE — Assessment & Plan Note (Signed)
stable overall by history and exam, recent data reviewed with pt, and pt to continue medical treatment as before,  to f/u any worsening symptoms or concerns Lab Results  Component Value Date   CREATININE 1.79* 02/21/2015

## 2015-03-01 NOTE — Discharge Summary (Signed)
Physician Discharge Summary  Jake Simon DZH:[id number] DOB: 1946-01-02 DOA: 02/11/2015  PCP: Cathlean Cower, MD  Admit date: 02/11/2015 Discharge date: 03/01/2015  Time spent: 35 minutes  Recommendations for Outpatient Follow-up:  1. Needs OP re-eval of creatinine    Discharge Diagnoses:  Active Problems:   Hyperlipidemia   Essential hypertension   Atypical chest pain   Discharge Condition: good  Diet recommendation: hh low salt  Filed Weights   02/11/15 2112  Weight: 77.021 kg (169 lb 12.8 oz)    History of present illness:  69 y/o ? Known mild diverticulitis based on colonoscopy 06/2014 Mild OSA Admitted 02/11/15 for Substernal CP Noted patient had some spoilt milk the night before and had numerous epiosdes of n/v stopping prior to coming to ED Troponin x 3 neg Echo EF65-70% without WM anomally  Noted CKD and counselled to cut back losartan to 50 mg daily Stable for d/c tolerating diet and d/c home for 1-2 week f/u c PCP    Discharge Exam: Filed Vitals:   02/12/15 0653 02/12/15 1102  BP: 102/49 113/60  Pulse:  72  Temp:    Resp:     Well alert oriented and in nad  General: eomim ncat Cardiovascular: s1 s 2no m/r/g Respiratory: clear no added sound  Discharge Instructions   Discharge Instructions    Diet - low sodium heart healthy    Complete by:  As directed      Discharge instructions    Complete by:  As directed   Half the amount of losartan that you take You need follow up for kidney dysfucntion See your regular MD in 1-2 weeks     Increase activity slowly    Complete by:  As directed           Discharge Medication List as of 02/12/2015  2:33 PM    START taking these medications   Details  alum & mag hydroxide-simeth (MAALOX/MYLANTA) 200-200-20 MG/5ML suspension Take 30 mLs by mouth every 6 (six) hours as needed for indigestion or heartburn (dyspepsia)., Starting 02/12/2015, Until Discontinued, Normal    ondansetron (ZOFRAN) 4 MG  tablet Take 1 tablet (4 mg total) by mouth every 6 (six) hours as needed for nausea., Starting 02/12/2015, Until Discontinued, Normal    oxyCODONE (OXY IR/ROXICODONE) 5 MG immediate release tablet Take 1 tablet (5 mg total) by mouth every 4 (four) hours as needed for moderate pain., Starting 02/12/2015, Until Discontinued, Print    !! aspirin EC 325 MG EC tablet Take 1 tablet (325 mg total) by mouth daily., Starting 02/12/2015, Until Discontinued, Normal     !! - Potential duplicate medications found. Please discuss with provider.    CONTINUE these medications which have CHANGED   Details  losartan (COZAAR) 100 MG tablet Take 0.5 tablets (50 mg total) by mouth daily., Starting 02/12/2015, Until Discontinued, Fax      CONTINUE these medications which have NOT CHANGED   Details  !! aspirin 81 MG EC tablet Take 81 mg by mouth at bedtime. 30 min before niacin, Until Discontinued, Historical Med    diltiazem (CARDIZEM CD) 180 MG 24 hr capsule Take 1 capsule (180 mg total) by mouth daily., Starting 10/12/2014, Until Discontinued, Fax    furosemide (LASIX) 20 MG tablet Take 1 tablet (20 mg total) by mouth daily., Starting 10/12/2014, Until Discontinued, Fax    gemfibrozil (LOPID) 600 MG tablet Take 1 tablet (600 mg total) by mouth 2 (two) times daily before a meal., Starting 10/12/2014, Until  Discontinued, Fax    levocetirizine (XYZAL) 5 MG tablet Take 1 tablet (5 mg total) by mouth daily as needed for allergies., Starting 10/12/2014, Until Discontinued, Normal    mometasone (ASMANEX 60 METERED DOSES) 220 MCG/INH inhaler Inhale 2 puffs into the lungs daily., Starting 10/12/2014, Until Discontinued, Fax    Na Sulfate-K Sulfate-Mg Sulf (SUPREP BOWEL PREP) SOLN Take 1 kit by mouth once. suprep as directed. No substitutions, Starting 06/05/2014, Normal    sildenafil (REVATIO) 20 MG tablet 2-5 tabs daily as needed, Normal    sodium chloride (MURO 128) 5 % ophthalmic ointment Place 1 application into both  eyes as needed for irritation., Until Discontinued, Historical Med    triamcinolone (NASACORT AQ) 55 MCG/ACT AERO nasal inhaler Place 2 sprays into the nose daily., Starting 10/12/2014, Until Discontinued, Normal     !! - Potential duplicate medications found. Please discuss with provider.     No Known Allergies    The results of significant diagnostics from this hospitalization (including imaging, microbiology, ancillary and laboratory) are listed below for reference.    Significant Diagnostic Studies: Dg Chest 2 View  02/11/2015  CLINICAL DATA:  Chest pain for 1 day EXAM: CHEST  2 VIEW COMPARISON:  None. FINDINGS: Lungs are clear. Heart size and pulmonary vascularity are normal. No adenopathy. No bone lesions. Calcification in the right upper quadrant appears to represent laminated gallstone. IMPRESSION: No edema or consolidation. Apparent laminated gallstone right upper quadrant. Electronically Signed   By: Lowella Grip III M.D.   On: 02/11/2015 15:17   US Abdomen Limited Ruq  02/11/2015  CLINICAL DATA:  Right upper quadrant pain EXAM: US ABDOMEN LIMITED - RIGHT UPPER QUADRANT COMPARISON:  None. FINDINGS: Gallbladder: Well distended with a focal gallstone within the gallbladder neck. It appears mobile on different positioning Common bile duct: Diameter: 2.7 mm. Liver: No focal lesion identified. Within normal limits in parenchymal echogenicity. IMPRESSION: Cholelithiasis without complicating factors. Electronically Signed   By: Inez Catalina M.D.   On: 02/11/2015 17:29    Microbiology: No results found for this or any previous visit (from the past 240 hour(s)).   Labs: Basic Metabolic Panel: No results for input(s): NA, K, CL, CO2, GLUCOSE, BUN, CREATININE, CALCIUM, MG, PHOS in the last 168 hours. Liver Function Tests: No results for input(s): AST, ALT, ALKPHOS, BILITOT, PROT, ALBUMIN in the last 168 hours. No results for input(s): LIPASE, AMYLASE in the last 168 hours. No  results for input(s): AMMONIA in the last 168 hours. CBC: No results for input(s): WBC, NEUTROABS, HGB, HCT, MCV, PLT in the last 168 hours. Cardiac Enzymes: No results for input(s): CKTOTAL, CKMB, CKMBINDEX, TROPONINI in the last 168 hours. BNP: BNP (last 3 results) No results for input(s): BNP in the last 8760 hours.  ProBNP (last 3 results) No results for input(s): PROBNP in the last 8760 hours.  CBG: No results for input(s): GLUCAP in the last 168 hours.     SignedNita Sells  [facility] 03/01/2015, 1:44 PM

## 2015-03-09 ENCOUNTER — Encounter: Payer: Self-pay | Admitting: Internal Medicine

## 2015-03-09 ENCOUNTER — Other Ambulatory Visit: Payer: Self-pay

## 2015-03-09 MED ORDER — LOSARTAN POTASSIUM 100 MG PO TABS
100.0000 mg | ORAL_TABLET | Freq: Every day | ORAL | Status: DC
Start: 2015-03-09 — End: 2015-11-26

## 2015-04-30 ENCOUNTER — Ambulatory Visit: Payer: Commercial Managed Care - HMO

## 2015-05-02 ENCOUNTER — Ambulatory Visit (INDEPENDENT_AMBULATORY_CARE_PROVIDER_SITE_OTHER): Payer: Commercial Managed Care - HMO

## 2015-05-02 VITALS — Ht 66.0 in | Wt 169.1 lb

## 2015-05-02 DIAGNOSIS — Z1159 Encounter for screening for other viral diseases: Secondary | ICD-10-CM

## 2015-05-02 DIAGNOSIS — Z0000 Encounter for general adult medical examination without abnormal findings: Secondary | ICD-10-CM | POA: Diagnosis not present

## 2015-05-02 NOTE — Progress Notes (Signed)
Subjective:   Jake Simon is a 70 y.o. male who presents for Medicare Annual/Subsequent preventive examination.  Review of Systems:   Cardiac Risk Factors include: advanced age (>26mn, >>48women);family history of premature cardiovascular disease;hypertension;male gender;dyslipidemia HRA assessment completed during visit; Mr. BHayashiThe Patient was informed that this wellness visit is to identify risk and educate on how to reduce risk for increase disease through lifestyle changes.   ROS deferred to CPE exam with physician  Describes his health as good;   Medical and family hx Mother had alz; died 247  Father had copd; asthma, stroke  Sister had breast cancer  Medical issues  Chest pain in dec; Turned out to be one large gallstone  HTN; controlled per the patient  Hyperlipidemia; 02/2015; chol 183, Trig 128; HDL 31; LDL 126 / Glucose elevated 105;    BMI: 27.3;  Diet;  Breakfast; has a bowl of bran cereal with oat cereal; fruit Boil an egg; one strip of bacon; homemade biscuit   Lunch; make a sandwich; ham and swiss cheese with l/t  Yogurt; cole slaw; chips; cookies   Dinner; grilled hamburger; 1/3 pound; L and T pickle Pintos; had meat and vegetables;   Exercise; VPsychologist, occupationalfor WInternational Paper  August  Plays bridge;  [facility]every [facility]; walks 2 miles; 45 minute; tries to walk every day Works in the yard    SAFETY;  Currently lives alone; has fianance; march 25th  [place];  2 story home; home is accessible;  SEngineer, materialsreviewed for the home;  Removal of clutter clearing paths through the home,  Bathroom safety; free standing shower Community safety; yes Smoke detectors yes Firearms safety / keep is safe place  Driving accidents and seatbelt/ no Sun protection/ does wear it in the summer;   Stressors; little more stressed; 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 month IField seismologist   Medication review/ completed/   Fall assessment/ no  Mobilization  and Functional losses in the last year.no Sleep patterns; sleep well  Slight case of sleep apnea; not really a problem  Corticosteroid inhaler for asthma;   Urinary or fecal incontinence reviewed/ no  Lifeline: http://www.lifelinesys.com/content/home; [phone]   Counseling: hep C: at risk;  Colonoscopy; 06/2014 due 06/2024 EKG: 02/2015 Hearing: had hearing exam; 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 Never Smoker   Smokeless tobacco  . Never Used     Counseling given: Yes   Past Medical History  Diagnosis Date  . Allergic rhinitis   . HTN (hypertension)   . Sleep apnea     moderate  . Vision blurred     epithelial basement membrane dystrophy  . Hyperlipidemia     high triglycerides  . Asthma    Past Surgical History  Procedure Laterality Date  . Tonsillectomy    . Hernia repair    . Dental implants    . Colonoscopy    .  Polypectomy     Family History  Problem Relation Age of Onset  . Alzheimer's disease Mother   . Breast cancer Mother   . Mental illness Mother     alzheimers  . COPD Father   . Asthma Father   . Stroke Father   . Diabetes Neg Hx   . Prostate cancer Neg Hx   . Colon cancer Neg Hx   . Heart disease Neg Hx   . Hyperlipidemia Neg Hx   . Rectal cancer Neg Hx   . Stomach cancer Neg Hx   . Cancer Sister     brain cancer  . Arthritis Sister    History  Sexual Activity  . Sexual Activity: Not Currently    Outpatient Encounter Prescriptions as of  05/02/2015  Medication Sig  . aspirin 81 MG EC tablet Take 81 mg by mouth at bedtime. 30 min before niacin  . diltiazem (CARDIZEM CD) 180 MG 24 hr capsule Take 1 capsule (180 mg total) by mouth daily.  . fenofibrate 160 MG tablet Take 1 tablet (160 mg total) by mouth daily.  . furosemide (LASIX) 20 MG tablet Take 1 tablet (20 mg total) by mouth daily.  . mometasone (ASMANEX 60 METERED DOSES) 220 MCG/INH inhaler Inhale 2 puffs into the lungs daily. (Patient taking differently: Inhale 2 puffs into the lungs daily as needed (for shortness of breath). )  . sildenafil (REVATIO) 20 MG tablet 2-5 tabs daily as needed  . sodium chloride (MURO 128) 5 % ophthalmic ointment Place 1 application into both eyes as needed for irritation.  Marland Kitchen alum & mag hydroxide-simeth (MAALOX/MYLANTA) 200-200-20 MG/5ML suspension Take 30 mLs by mouth every 6 (six) hours as needed for indigestion or heartburn (dyspepsia). (Patient not taking: Reported on 05/02/2015)  . gemfibrozil (LOPID) 600 MG tablet Take 1 tablet (600 mg total) by mouth 2 (two) times daily before a meal. (Patient not taking: Reported on 05/02/2015)  . levocetirizine (XYZAL) 5 MG tablet Take 1 tablet (5 mg total) by mouth daily as needed for allergies. (Patient not taking: Reported on 02/20/2015)  . losartan (COZAAR) 100 MG tablet Take 1 tablet (100 mg total) by mouth daily.  . Na Sulfate-K Sulfate-Mg Sulf (SUPREP BOWEL PREP) SOLN Take 1 kit by mouth once. suprep as directed. No substitutions (Patient not taking: Reported on 02/20/2015)  . ondansetron (ZOFRAN) 4 MG tablet Take 1 tablet (4 mg total) by mouth every 6 (six) hours as needed for nausea. (Patient not taking: Reported on 02/20/2015)  . oxyCODONE (OXY IR/ROXICODONE) 5 MG immediate release tablet Take 1 tablet (5 mg total) by mouth every 4 (four) hours as needed for moderate pain. (Patient not taking: Reported on 02/20/2015)  . triamcinolone (NASACORT AQ) 55 MCG/ACT AERO nasal inhaler Place 2 sprays into the  nose daily. (Patient not taking: Reported on 02/20/2015)   No facility-administered encounter medications on file as of 05/02/2015.    Activities of Daily Living In your present state of health, do you have any difficulty performing the following activities: 05/02/2015 02/11/2015  Hearing? N N  Vision? N N  Difficulty concentrating or making decisions? N N  Walking or climbing stairs? N N  Dressing or bathing? N N  Doing errands, shopping? N N  Preparing Food and eating ? N -  Using the Toilet? N -  In the past six months, have you accidently leaked urine? N -  Do you have problems with loss of bowel control? N -  Managing your Medications? N -  Managing your Finances? N -  Housekeeping or managing your Housekeeping? N -    Patient Care Team: Biagio Borg, MD as PCP - General (Internal Medicine) Inda Castle, MD (Gastroenterology) Clent Jacks, MD (Ophthalmology) Carolan Clines, MD (Urology) Jarome Matin, MD as Consulting Physician (Dermatology)   Assessment:    Assessment    Medicare questionnaire screening were completed, i.e. Functional; fall risk; depression, memory loss and hearing; had hearing screen and minor hearing loss not an issue at present  All immunizations and health maintenance protocols were reviewed with the patient and the patient did have PSV the same year he turned 58; will decline repeat of PSV 23  Education provided for laboratory screens;  Did not note renal issues, but Dr. Jenny Reichmann did note that he will continue to follow.   Medication reconciliation, past medical history, social history, problem list and allergies were reviewed in detail with the patient  Goals were established with regard to increase in exercise to increase HDL.  End of life planning was discussed and has been completed. Will bring copy of HCPOA to the office;    Exercise Activities and Dietary recommendations Current Exercise Habits:: Home exercise routine, Type of exercise:  walking, Time (Minutes): 60 (already 30 minutes), Frequency (Times/Week): 3, Weekly Exercise (Minutes/Week): 180, Intensity: Moderate  Goals    . Exercise 150 minutes per week (moderate activity)     HDL 31 and goal at least 40; Will walk an add'l 30 minutes 3 days a week;       Fall Risk Fall Risk  05/02/2015 09/20/2013  Falls in the past year? No No   Depression Screen PHQ 2/9 Scores 05/02/2015 09/20/2013  PHQ - 2 Score 0 0    Cognitive Testing MMSE - Mini Mental State Exam 05/02/2015  Not completed: (No Data)    Ad9 score is 0; plays duplicate bridge for fun and mental stimulation  Immunization History  Administered Date(s) Administered  . H1N1 02/15/2008  . Influenza Split 12/10/2010, 11/11/2011  . Influenza Whole 01/05/2008, 12/12/2008, 12/11/2009  . Influenza, High Dose Seasonal PF 12/08/2013  . Influenza,inj,Quad PF,36+ Mos 11/23/2012, 11/16/2014  . Pneumococcal Conjugate-13 09/20/2013  . Pneumococcal Polysaccharide-23 05/22/2009  . Td 05/02/2008  . Zoster 08/20/2009   Screening Tests Health Maintenance  Topic Date Due  . Hepatitis C Screening  16-Oct-1945  . INFLUENZA VACCINE  10/02/2015  . TETANUS/TDAP  05/03/2018  . COLONOSCOPY  06/18/2024  . ZOSTAVAX  Completed      Plan:     Will take PSV off epic list as the patient declines repeat;  During the course of the visit the patient was educated and counseled about the following appropriate screening and preventive services:   Vaccines to include Pneumoccal, Influenza, Hepatitis B, Td, Zostavax, HCV/   Electrocardiogram / on losartan 100;   Cardiovascular Disease; ruled out in er; Gallstone found and is asymptomatic   Colorectal cancer screening/06/2024  Diabetes screening/ neg  Prostate Cancer Screening/ discussed; Dr Linda Hedges did not check but PSA down as a future lab.   Glaucoma screening/ Dr Katy Simon; has BMD; watching at present  Nutrition counseling / cooks and eats 3 meals a day; discussed sodium;  verbalizes awareness of sodium in foods; hamburger or biscuits he makes  Smoking cessation counseling/ n/a   Patient Instructions (the written plan) was given to the patient.    Wynetta Fines, RN  05/02/2015

## 2015-05-02 NOTE — Patient Instructions (Addendum)
Jake Simon , Thank you for taking time to come for your Medicare Wellness Visit. I appreciate your ongoing commitment to your health goals. Please review the following plan we discussed and let me know if I can assist you in the future.   Hep c at next blood draw  Not a candidate for PSV 23 repeat due to taking it the same year he turned 89;  Opted out at 2nd PSV vaccine    These are the goals we discussed: Goals    . Exercise 150 minutes per week (moderate activity)     HDL 31 and goal at least 40; Will walk an add'l 30 minutes 3 days a week;        This is a list of the screening recommended for you and due dates:  Health Maintenance  Topic Date Due  .  Hepatitis C: One time screening is recommended by Center for Disease Control  (CDC) for  adults born from [birth year] through 1966.   1945-07-17  . Flu Shot  10/02/2015  . Tetanus Vaccine  05/03/2018  . Colon Cancer Screening  06/18/2024  . Shingles Vaccine  Completed     Fall Prevention in the Home  Falls can cause injuries. They can happen to people of all ages. There are many things you can do to make your home safe and to help prevent falls.  WHAT CAN I DO ON THE OUTSIDE OF MY HOME?  Regularly fix the edges of walkways and driveways and fix any cracks.  Remove anything that might make you trip as you walk through a door, such as a raised step or threshold.  Trim any bushes or trees on the path to your home.  Use bright outdoor lighting.  Clear any walking paths of anything that might make someone trip, such as rocks or tools.  Regularly check to see if handrails are loose or broken. Make sure that both sides of any steps have handrails.  Any raised decks and porches should have guardrails on the edges.  Have any leaves, snow, or ice cleared regularly.  Use sand or salt on walking paths during winter.  Clean up any spills in your garage right away. This includes oil or grease spills. WHAT CAN I DO IN THE BATHROOM?    Use night lights.  Install grab bars by the toilet and in the tub and shower. Do not use towel bars as grab bars.  Use non-skid mats or decals in the tub or shower.  If you need to sit down in the shower, use a plastic, non-slip stool.  Keep the floor dry. Clean up any water that spills on the floor as soon as it happens.  Remove soap buildup in the tub or shower regularly.  Attach bath mats securely with double-sided non-slip rug tape.  Do not have throw rugs and other things on the floor that can make you trip. WHAT CAN I DO IN THE BEDROOM?  Use night lights.  Make sure that you have a light by your bed that is easy to reach.  Do not use any sheets or blankets that are too big for your bed. They should not hang down onto the floor.  Have a firm chair that has side arms. You can use this for support while you get dressed.  Do not have throw rugs and other things on the floor that can make you trip. WHAT CAN I DO IN THE KITCHEN?  Clean up any spills right away.  Avoid walking on wet floors.  Keep items that you use a lot in easy-to-reach places.  If you need to reach something above you, use a strong step stool that has a grab bar.  Keep electrical cords out of the way.  Do not use floor polish or wax that makes floors slippery. If you must use wax, use non-skid floor wax.  Do not have throw rugs and other things on the floor that can make you trip. WHAT CAN I DO WITH MY STAIRS?  Do not leave any items on the stairs.  Make sure that there are handrails on both sides of the stairs and use them. Fix handrails that are broken or loose. Make sure that handrails are as long as the stairways.  Check any carpeting to make sure that it is firmly attached to the stairs. Fix any carpet that is loose or worn.  Avoid having throw rugs at the top or bottom of the stairs. If you do have throw rugs, attach them to the floor with carpet tape.  Make sure that you have a light  switch at the top of the stairs and the bottom of the stairs. If you do not have them, ask someone to add them for you. WHAT ELSE CAN I DO TO HELP PREVENT FALLS?  Wear shoes that:  Do not have high heels.  Have rubber bottoms.  Are comfortable and fit you well.  Are closed at the toe. Do not wear sandals.  If you use a stepladder:  Make sure that it is fully opened. Do not climb a closed stepladder.  Make sure that both sides of the stepladder are locked into place.  Ask someone to hold it for you, if possible.  Clearly mark and make sure that you can see:  Any grab bars or handrails.  First and last steps.  Where the edge of each step is.  Use tools that help you move around (mobility aids) if they are needed. These include:  Canes.  Walkers.  Scooters.  Crutches.  Turn on the lights when you go into a dark area. Replace any light bulbs as soon as they burn out.  Set up your furniture so you have a clear path. Avoid moving your furniture around.  If any of your floors are uneven, fix them.  If there are any pets around you, be aware of where they are.  Review your medicines with your doctor. Some medicines can make you feel dizzy. This can increase your chance of falling. Ask your doctor what other things that you can do to help prevent falls.   This information is not intended to replace advice given to you by your health care provider. Make sure you discuss any questions you have with your health care provider.   Document Released: 12/14/2008 Document Revised: 07/04/2014 Document Reviewed: 03/24/2014 Elsevier Interactive Patient Education 2016 [place] Maintenance, Male A healthy lifestyle and preventative care can promote health and wellness.  Maintain regular health, dental, and eye exams.  Eat a healthy diet. Foods like vegetables, fruits, whole grains, low-fat dairy products, and lean protein foods contain the nutrients you need and are  low in calories. Decrease your intake of foods high in solid fats, added sugars, and salt. Get information about a proper diet from your health care provider, if necessary.  Regular physical exercise is one of the most important things you can do for your health. Most adults should get at least 150 minutes  of moderate-intensity exercise (any activity that increases your heart rate and causes you to sweat) each week. In addition, most adults need muscle-strengthening exercises on 2 or more days a week.   Maintain a healthy weight. The body mass index (BMI) is a screening tool to identify possible weight problems. It provides an estimate of body fat based on height and weight. Your health care provider can find your BMI and can help you achieve or maintain a healthy weight. For males 20 years and older:  A BMI below 18.5 is considered underweight.  A BMI of 18.5 to 24.9 is normal.  A BMI of 25 to 29.9 is considered overweight.  A BMI of 30 and above is considered obese.  Maintain normal blood lipids and cholesterol by exercising and minimizing your intake of saturated fat. Eat a balanced diet with plenty of fruits and vegetables. Blood tests for lipids and cholesterol should begin at age 25 and be repeated every 5 years. If your lipid or cholesterol levels are high, you are over age 49, or you are at high risk for heart disease, you may need your cholesterol levels checked more frequently.Ongoing high lipid and cholesterol levels should be treated with medicines if diet and exercise are not working.  If you smoke, find out from your health care provider how to quit. If you do not use tobacco, do not start.  Lung cancer screening is recommended for adults aged 63-80 years who are at high risk for developing lung cancer because of a history of smoking. A yearly low-dose CT scan of the lungs is recommended for people who have at least a 30-pack-year history of smoking and are current smokers or have  quit within the past 15 years. A pack year of smoking is smoking an average of 1 pack of cigarettes a day for 1 year (for example, a 30-pack-year history of smoking could mean smoking 1 pack a day for 30 years or 2 packs a day for 15 years). Yearly screening should continue until the smoker has stopped smoking for at least 15 years. Yearly screening should be stopped for people who develop a health problem that would prevent them from having lung cancer treatment.  If you choose to drink alcohol, do not have more than 2 drinks per day. One drink is considered to be 12 oz (360 mL) of beer, 5 oz (150 mL) of wine, or 1.5 oz (45 mL) of liquor.  Avoid the use of street drugs. Do not share needles with anyone. Ask for help if you need support or instructions about stopping the use of drugs.  High blood pressure causes heart disease and increases the risk of stroke. High blood pressure is more likely to develop in:  People who have blood pressure in the end of the normal range (100-139/85-89 mm Hg).  People who are overweight or obese.  People who are African American.  If you are 25-27 years of age, have your blood pressure checked every 3-5 years. If you are 71 years of age or older, have your blood pressure checked every year. You should have your blood pressure measured twice--once when you are at a hospital or clinic, and once when you are not at a hospital or clinic. Record the average of the two measurements. To check your blood pressure when you are not at a hospital or clinic, you can use:  An automated blood pressure machine at a pharmacy.  A home blood pressure monitor.  If you are  42-36 years old, ask your health care provider if you should take aspirin to prevent heart disease.  Diabetes screening involves taking a blood sample to check your fasting blood sugar level. This should be done once every 3 years after age 64 if you are at a normal weight and without risk factors for diabetes.  Testing should be considered at a younger age or be carried out more frequently if you are overweight and have at least 1 risk factor for diabetes.  Colorectal cancer can be detected and often prevented. Most routine colorectal cancer screening begins at the age of 18 and continues through age 85. However, your health care provider may recommend screening at an earlier age if you have risk factors for colon cancer. On a yearly basis, your health care provider may provide home test kits to check for hidden blood in the stool. A small camera at the end of a tube may be used to directly examine the colon (sigmoidoscopy or colonoscopy) to detect the earliest forms of colorectal cancer. Talk to your health care provider about this at age 57 when routine screening begins. A direct exam of the colon should be repeated every 5-10 years through age 86, unless early forms of precancerous polyps or small growths are found.  People who are at an increased risk for hepatitis B should be screened for this virus. You are considered at high risk for hepatitis B if:  You were born in a country where hepatitis B occurs often. Talk with your health care provider about which countries are considered high risk.  Your parents were born in a high-risk country and you have not received a shot to protect against hepatitis B (hepatitis B vaccine).  You have HIV or AIDS.  You use needles to inject street drugs.  You live with, or have sex with, someone who has hepatitis B.  You are a man who has sex with other men (MSM).  You get hemodialysis treatment.  You take certain medicines for conditions like cancer, organ transplantation, and autoimmune conditions.  Hepatitis C blood testing is recommended for all people born from [birth year] through 1965 and any individual with known risk factors for hepatitis C.  Healthy men should no longer receive prostate-specific antigen (PSA) blood tests as part of routine cancer screening.  Talk to your health care provider about prostate cancer screening.  Testicular cancer screening is not recommended for adolescents or adult males who have no symptoms. Screening includes self-exam, a health care provider exam, and other screening tests. Consult with your health care provider about any symptoms you have or any concerns you have about testicular cancer.  Practice safe sex. Use condoms and avoid high-risk sexual practices to reduce the spread of sexually transmitted infections (STIs).  You should be screened for STIs, including gonorrhea and chlamydia if:  You are sexually active and are younger than 24 years.  You are older than 24 years, and your health care provider tells you that you are at risk for this type of infection.  Your sexual activity has changed since you were last screened, and you are at an increased risk for chlamydia or gonorrhea. Ask your health care provider if you are at risk.  If you are at risk of being infected with HIV, it is recommended that you take a prescription medicine daily to prevent HIV infection. This is called pre-exposure prophylaxis (PrEP). You are considered at risk if:  You are a man who has sex with other  men (MSM).  You are a heterosexual man who is sexually active with multiple partners.  You take drugs by injection.  You are sexually active with a partner who has HIV.  Talk with your health care provider about whether you are at high risk of being infected with HIV. If you choose to begin PrEP, you should first be tested for HIV. You should then be tested every 3 months for as long as you are taking PrEP.  Use sunscreen. Apply sunscreen liberally and repeatedly throughout the day. You should seek shade when your shadow is shorter than you. Protect yourself by wearing long sleeves, pants, a wide-brimmed hat, and sunglasses year round whenever you are outdoors.  Tell your health care provider of new moles or changes in moles,  especially if there is a change in shape or color. Also, tell your health care provider if a mole is larger than the size of a pencil eraser.  A one-time screening for abdominal aortic aneurysm (AAA) and surgical repair of large AAAs by ultrasound is recommended for men aged 74-75 years who are current or former smokers.  Stay current with your vaccines (immunizations).   This information is not intended to replace advice given to you by your health care provider. Make sure you discuss any questions you have with your health care provider.   Document Released: 08/16/2007 Document Revised: 03/10/2014 Document Reviewed: 07/15/2010 Elsevier Interactive Patient Education [employer].

## 2015-05-03 NOTE — Progress Notes (Signed)
Medical screening examination/treatment/procedure(s) were performed by non-physician practitioner and as supervising physician I was immediately available for consultation/collaboration. I agree with above. Elizabeth A Crawford, MD 

## 2015-07-19 DIAGNOSIS — H2513 Age-related nuclear cataract, bilateral: Secondary | ICD-10-CM | POA: Diagnosis not present

## 2015-07-19 DIAGNOSIS — D3131 Benign neoplasm of right choroid: Secondary | ICD-10-CM | POA: Diagnosis not present

## 2015-07-19 DIAGNOSIS — H10413 Chronic giant papillary conjunctivitis, bilateral: Secondary | ICD-10-CM | POA: Diagnosis not present

## 2015-07-19 DIAGNOSIS — H04123 Dry eye syndrome of bilateral lacrimal glands: Secondary | ICD-10-CM | POA: Diagnosis not present

## 2015-07-19 DIAGNOSIS — H1859 Other hereditary corneal dystrophies: Secondary | ICD-10-CM | POA: Diagnosis not present

## 2015-08-14 ENCOUNTER — Encounter: Payer: Self-pay | Admitting: Internal Medicine

## 2015-08-14 MED ORDER — FLUTICASONE PROPIONATE HFA 110 MCG/ACT IN AERO: 2.0000 | INHALATION_SPRAY | Freq: Every day | RESPIRATORY_TRACT | Status: DC

## 2015-09-23 ENCOUNTER — Other Ambulatory Visit: Payer: Self-pay | Admitting: Internal Medicine

## 2015-11-22 ENCOUNTER — Ambulatory Visit (INDEPENDENT_AMBULATORY_CARE_PROVIDER_SITE_OTHER): Payer: Commercial Managed Care - HMO

## 2015-11-22 DIAGNOSIS — Z23 Encounter for immunization: Secondary | ICD-10-CM | POA: Diagnosis not present

## 2015-11-26 ENCOUNTER — Other Ambulatory Visit: Payer: Self-pay | Admitting: Internal Medicine

## 2015-12-11 ENCOUNTER — Other Ambulatory Visit: Payer: Self-pay | Admitting: Internal Medicine

## 2015-12-11 ENCOUNTER — Ambulatory Visit (INDEPENDENT_AMBULATORY_CARE_PROVIDER_SITE_OTHER): Payer: Commercial Managed Care - HMO | Admitting: Internal Medicine

## 2015-12-11 ENCOUNTER — Other Ambulatory Visit (INDEPENDENT_AMBULATORY_CARE_PROVIDER_SITE_OTHER): Payer: Commercial Managed Care - HMO

## 2015-12-11 ENCOUNTER — Encounter: Payer: Self-pay | Admitting: Internal Medicine

## 2015-12-11 VITALS — BP 118/74 | HR 86 | Temp 98.3°F | Resp 20 | Wt 163.0 lb

## 2015-12-11 DIAGNOSIS — N183 Chronic kidney disease, stage 3 unspecified: Secondary | ICD-10-CM

## 2015-12-11 DIAGNOSIS — E785 Hyperlipidemia, unspecified: Secondary | ICD-10-CM | POA: Diagnosis not present

## 2015-12-11 DIAGNOSIS — R739 Hyperglycemia, unspecified: Secondary | ICD-10-CM

## 2015-12-11 DIAGNOSIS — I10 Essential (primary) hypertension: Secondary | ICD-10-CM | POA: Diagnosis not present

## 2015-12-11 DIAGNOSIS — R7989 Other specified abnormal findings of blood chemistry: Secondary | ICD-10-CM

## 2015-12-11 DIAGNOSIS — Z1159 Encounter for screening for other viral diseases: Secondary | ICD-10-CM

## 2015-12-11 DIAGNOSIS — H9191 Unspecified hearing loss, right ear: Secondary | ICD-10-CM | POA: Diagnosis not present

## 2015-12-11 DIAGNOSIS — Z0001 Encounter for general adult medical examination with abnormal findings: Secondary | ICD-10-CM | POA: Diagnosis not present

## 2015-12-11 LAB — URINALYSIS, ROUTINE W REFLEX MICROSCOPIC
Bilirubin Urine: NEGATIVE
Hgb urine dipstick: NEGATIVE
KETONES UR: NEGATIVE
Leukocytes, UA: NEGATIVE
Nitrite: NEGATIVE
PH: 5.5 (ref 5.0–8.0)
RBC / HPF: NONE SEEN (ref 0–?)
SPECIFIC GRAVITY, URINE: 1.02 (ref 1.000–1.030)
TOTAL PROTEIN, URINE-UPE24: NEGATIVE
URINE GLUCOSE: NEGATIVE
UROBILINOGEN UA: 0.2 (ref 0.0–1.0)

## 2015-12-11 LAB — CBC WITH DIFFERENTIAL/PLATELET
BASOS ABS: 0.1 10*3/uL (ref 0.0–0.1)
Basophils Relative: 0.7 % (ref 0.0–3.0)
EOS ABS: 0 10*3/uL (ref 0.0–0.7)
EOS PCT: 0 % (ref 0.0–5.0)
HEMATOCRIT: 43 % (ref 39.0–52.0)
Hemoglobin: 15 g/dL (ref 13.0–17.0)
LYMPHS ABS: 1.8 10*3/uL (ref 0.7–4.0)
Lymphocytes Relative: 20.8 % (ref 12.0–46.0)
MCHC: 34.8 g/dL (ref 30.0–36.0)
MCV: 89.1 fl (ref 78.0–100.0)
MONOS PCT: 8.7 % (ref 3.0–12.0)
Monocytes Absolute: 0.8 10*3/uL (ref 0.1–1.0)
NEUTROS ABS: 6.1 10*3/uL (ref 1.4–7.7)
Neutrophils Relative %: 69.8 % (ref 43.0–77.0)
PLATELETS: 212 10*3/uL (ref 150.0–400.0)
RBC: 4.83 Mil/uL (ref 4.22–5.81)
RDW: 13.2 % (ref 11.5–15.5)
WBC: 8.7 10*3/uL (ref 4.0–10.5)

## 2015-12-11 LAB — BASIC METABOLIC PANEL
BUN: 37 mg/dL — AB (ref 6–23)
CALCIUM: 9.4 mg/dL (ref 8.4–10.5)
CHLORIDE: 103 meq/L (ref 96–112)
CO2: 28 meq/L (ref 19–32)
Creatinine, Ser: 2.18 mg/dL — ABNORMAL HIGH (ref 0.40–1.50)
GFR: 31.86 mL/min — AB (ref >60.00)
GLUCOSE: 112 mg/dL — AB (ref 70–99)
POTASSIUM: 4.3 meq/L (ref 3.5–5.1)
Sodium: 139 mEq/L (ref 135–145)

## 2015-12-11 LAB — HEPATIC FUNCTION PANEL
ALBUMIN: 4.2 g/dL (ref 3.5–5.2)
ALK PHOS: 35 U/L — AB (ref 39–117)
ALT: 14 U/L (ref 0–53)
AST: 15 U/L (ref 0–37)
BILIRUBIN DIRECT: 0.2 mg/dL (ref 0.0–0.3)
BILIRUBIN TOTAL: 0.8 mg/dL (ref 0.2–1.2)
Total Protein: 6.9 g/dL (ref 6.0–8.3)

## 2015-12-11 LAB — TSH: TSH: 1.37 u[IU]/mL (ref 0.35–4.50)

## 2015-12-11 LAB — LIPID PANEL
CHOLESTEROL: 247 mg/dL — AB (ref 0–200)
HDL: 37.5 mg/dL — ABNORMAL LOW (ref >39.00)
NonHDL: 209.3
TRIGLYCERIDES: 368 mg/dL — AB (ref 0.0–149.0)
Total CHOL/HDL Ratio: 7
VLDL: 73.6 mg/dL — ABNORMAL HIGH (ref 0.0–40.0)

## 2015-12-11 LAB — HEMOGLOBIN A1C: Hgb A1c MFr Bld: 5.5 % (ref 4.6–6.5)

## 2015-12-11 LAB — LDL CHOLESTEROL, DIRECT: Direct LDL: 149 mg/dL

## 2015-12-11 LAB — PSA: PSA: 0.71 ng/mL (ref 0.10–4.00)

## 2015-12-11 MED ORDER — ATORVASTATIN CALCIUM 20 MG PO TABS: 20.0000 mg | ORAL_TABLET | Freq: Every day | ORAL | 3 refills | Status: DC

## 2015-12-11 MED ORDER — AZITHROMYCIN 250 MG PO TABS: ORAL_TABLET | ORAL | 0 refills | Status: DC

## 2015-12-11 MED ORDER — LOSARTAN POTASSIUM 100 MG PO TABS: 100.0000 mg | ORAL_TABLET | Freq: Every day | ORAL | 3 refills | Status: DC

## 2015-12-11 NOTE — Patient Instructions (Addendum)
Please continue all other medications as before, and refills have been done if requested - the zpack  Please have the pharmacy call with any other refills you may need.  Please continue your efforts at being more active, low cholesterol diet, and weight control.  You are otherwise up to date with prevention measures today.  Please keep your appointments with your specialists as you may have planned  Your right ear was irrigated today of wax  Please go to the LAB in the Basement (turn left off the elevator) for the tests to be done today  You will be contacted by phone if any changes need to be made immediately.  Otherwise, you will receive a letter about your results with an explanation, but please check with MyChart first.  Please remember to sign up for MyChart if you have not done so, as this will be important to you in the future with finding out test results, communicating by private email, and scheduling acute appointments online when needed.  Please return in 1 year for your yearly visit, or sooner if needed, with Lab testing done 3-5 days before

## 2015-12-11 NOTE — Progress Notes (Signed)
Pre visit review using our clinic review tool, if applicable. No additional management support is needed unless otherwise documented below in the visit note. 

## 2015-12-11 NOTE — Progress Notes (Signed)
Subjective:    Patient ID: Jake Simon, male    DOB: [birth year], 70 y.o.   MRN: [id number]  HPI  Here for wellness and f/u;  Overall doing ok;  Pt denies Chest pain, worsening SOB, DOE, wheezing, orthopnea, PND, worsening LE edema, palpitations, dizziness or syncope.  Pt denies neurological change such as new headache, facial or extremity weakness.  Pt denies polydipsia, polyuria, or low sugar symptoms. Pt states overall good compliance with treatment and medications, good tolerability, and has been trying to follow appropriate diet.  Pt denies worsening depressive symptoms, suicidal ideation or panic. No fever, night sweats, wt loss, loss of appetite, or other constitutional symptoms.  Pt states good ability with ADL's, has low fall risk, home safety reviewed and adequate, no other significant changes in hearing or vision, and only occasionally active with exercise, wt stable Wt Readings from Last 3 Encounters:  12/11/15 163 lb (73.9 kg)  05/02/15 169 lb 2 oz (76.7 kg)  02/20/15 172 lb (78 kg)  Going on cruise in 45 days, asks for prophylactic antbx. States wife says he needs hearing checked as he has recurring wax buildup and has had reduced hearing for 2 wks, without pain, HA, sinus congestion, ST or cough or fever.  Past Medical History:  Diagnosis Date  . Allergic rhinitis   . Asthma   . HTN (hypertension)   . Hyperlipidemia    high triglycerides  . Sleep apnea    moderate  . Vision blurred    epithelial basement membrane dystrophy   Past Surgical History:  Procedure Laterality Date  . COLONOSCOPY    . dental implants    . HERNIA REPAIR    . POLYPECTOMY    . TONSILLECTOMY      reports that he has never smoked. He has never used smokeless tobacco. He reports that he drinks alcohol. He reports that he does not use drugs. family history includes Alzheimer's disease in his mother; Arthritis in his sister; Asthma in his father; Breast cancer in his mother; COPD in his father;  Cancer in his sister; Mental illness in his mother; Stroke in his father. No Known Allergies Current Outpatient Prescriptions on File Prior to Visit  Medication Sig Dispense Refill  . aspirin 81 MG EC tablet Take 81 mg by mouth at bedtime. 30 min before niacin    . CARTIA XT 180 MG 24 hr capsule TAKE 1 CAPSULE EVERY DAY 90 capsule 3  . fenofibrate 160 MG tablet TAKE 1 TABLET EVERY DAY 30 tablet 0  . fluticasone (FLOVENT HFA) 110 MCG/ACT inhaler Inhale 2 puffs into the lungs daily. 3 Inhaler 3  . furosemide (LASIX) 20 MG tablet TAKE 1 TABLET EVERY DAY 90 tablet 3  . levocetirizine (XYZAL) 5 MG tablet Take 1 tablet (5 mg total) by mouth daily as needed for allergies. 90 tablet 3  . Na Sulfate-K Sulfate-Mg Sulf (SUPREP BOWEL PREP) SOLN Take 1 kit by mouth once. suprep as directed. No substitutions 354 mL 0  . sildenafil (REVATIO) 20 MG tablet 2-5 tabs daily as needed 90 tablet 11  . sodium chloride (MURO 128) 5 % ophthalmic ointment Place 1 application into both eyes as needed for irritation.    Marland Kitchen alum & mag hydroxide-simeth (MAALOX/MYLANTA) 200-200-20 MG/5ML suspension Take 30 mLs by mouth every 6 (six) hours as needed for indigestion or heartburn (dyspepsia). (Patient not taking: Reported on 12/11/2015) 355 mL 0  . gemfibrozil (LOPID) 600 MG tablet Take 1 tablet (600 mg total)  by mouth 2 (two) times daily before a meal. (Patient not taking: Reported on 12/11/2015) 180 tablet 3  . ondansetron (ZOFRAN) 4 MG tablet Take 1 tablet (4 mg total) by mouth every 6 (six) hours as needed for nausea. (Patient not taking: Reported on 12/11/2015) 20 tablet 0  . oxyCODONE (OXY IR/ROXICODONE) 5 MG immediate release tablet Take 1 tablet (5 mg total) by mouth every 4 (four) hours as needed for moderate pain. (Patient not taking: Reported on 12/11/2015) 30 tablet 0  . triamcinolone (NASACORT AQ) 55 MCG/ACT AERO nasal inhaler Place 2 sprays into the nose daily. (Patient not taking: Reported on 12/11/2015) 1 Inhaler 12     No current facility-administered medications on file prior to visit.    Review of Systems  Constitutional: Negative for increased diaphoresis, or other activity, appetite or siginficant weight change other than noted HENT: Negative for worsening hearing loss, ear pain, facial swelling, mouth sores and neck stiffness.   Eyes: Negative for other worsening pain, redness or visual disturbance.  Respiratory: Negative for choking or stridor Cardiovascular: Negative for other chest pain and palpitations.  Gastrointestinal: Negative for worsening diarrhea, blood in stool, or abdominal distention Genitourinary: Negative for hematuria, flank pain or change in urine volume.  Musculoskeletal: Negative for myalgias or other joint complaints.  Skin: Negative for other color change and wound or drainage.  Neurological: Negative for syncope and numbness. other than noted Hematological: Negative for adenopathy. or other swelling Psychiatric/Behavioral: Negative for hallucinations, SI, self-injury, decreased concentration or other worsening agitation.       Objective:   Physical Exam BP 118/74   Pulse 86   Temp 98.3 F (36.8 C) (Oral)   Resp 20   Wt 163 lb (73.9 kg)   SpO2 98%   BMI 26.31 kg/m  VS noted,  Constitutional: Pt is oriented to person, place, and time. Appears well-developed and well-nourished, in no significant distress Head: Normocephalic and atraumatic  Eyes: Conjunctivae and EOM are normal. Pupils are equal, round, and reactive to light Right Ear: External ear normal. Right ear wax impaction resolved with irrigation  Left Ear: External ear normal Nose: Nose normal.  Mouth/Throat: Oropharynx is clear and moist  Neck: Normal range of motion. Neck supple. No JVD present. No tracheal deviation present or significant neck LA or mass Cardiovascular: Normal rate, regular rhythm, normal heart sounds and intact distal pulses.   Pulmonary/Chest: Effort normal and breath sounds without  rales or wheezing  Abdominal: Soft. Bowel sounds are normal. NT. No HSM  Musculoskeletal: Normal range of motion. Exhibits no edema Lymphadenopathy: Has no cervical adenopathy.  Neurological: Pt is alert and oriented to person, place, and time. Pt has normal reflexes. No cranial nerve deficit. Motor grossly intact Skin: Skin is warm and dry. No rash noted or new ulcers Psychiatric:  Has normal mood and affect. Behavior is normal.     Assessment & Plan:

## 2015-12-15 DIAGNOSIS — H9192 Unspecified hearing loss, left ear: Secondary | ICD-10-CM | POA: Insufficient documentation

## 2015-12-15 DIAGNOSIS — H9191 Unspecified hearing loss, right ear: Secondary | ICD-10-CM | POA: Insufficient documentation

## 2015-12-15 NOTE — Assessment & Plan Note (Signed)
stable overall by history and exam, recent data reviewed with pt, and pt to continue medical treatment as before,  to f/u any worsening symptoms or concerns BP Readings from Last 3 Encounters:  12/11/15 118/74  02/20/15 130/78  02/12/15 113/60

## 2015-12-15 NOTE — Assessment & Plan Note (Signed)
stable overall by history and exam, recent data reviewed with pt, and pt to continue medical treatment as before,  to f/u any worsening symptoms or concerns Lab Results  Component Value Date   CREATININE 2.18 (H) 12/11/2015

## 2015-12-15 NOTE — Assessment & Plan Note (Signed)
stable overall by history and exam, recent data reviewed with pt, and pt to continue medical treatment as before,  to f/u any worsening symptoms or concerns  Lab Results  Component Value Date   LDLCALC 126 (H) 02/12/2015

## 2015-12-15 NOTE — Assessment & Plan Note (Signed)
Mild to mod, resolved with irrigation,  to f/u any worsening symptoms or concerns  In addition to the time spent performing CPE, I spent an additional 15 minutes face to face,in which greater than 50% of this time was spent in counseling and coordination of care for patient's illness as documented.

## 2015-12-15 NOTE — Assessment & Plan Note (Signed)
Mild to mod, for a1c check,,  to f/u any worsening symptoms or concerns

## 2015-12-15 NOTE — Assessment & Plan Note (Signed)

## 2015-12-18 ENCOUNTER — Encounter: Payer: Self-pay | Admitting: Internal Medicine

## 2015-12-18 ENCOUNTER — Other Ambulatory Visit: Payer: Self-pay | Admitting: *Deleted

## 2015-12-18 MED ORDER — ATORVASTATIN CALCIUM 20 MG PO TABS: 20.0000 mg | ORAL_TABLET | Freq: Every day | ORAL | 3 refills | Status: DC

## 2015-12-20 ENCOUNTER — Telehealth: Payer: Self-pay | Admitting: *Deleted

## 2015-12-20 MED ORDER — ATORVASTATIN CALCIUM 20 MG PO TABS: 20.0000 mg | ORAL_TABLET | Freq: Every day | ORAL | 3 refills | Status: DC

## 2015-12-20 NOTE — Telephone Encounter (Signed)
Received fax from [facility] requesting refill on pt atorvastatin. Sent electronically...[name]

## 2016-01-02 DIAGNOSIS — J45909 Unspecified asthma, uncomplicated: Secondary | ICD-10-CM | POA: Diagnosis not present

## 2016-01-02 DIAGNOSIS — I129 Hypertensive chronic kidney disease with stage 1 through stage 4 chronic kidney disease, or unspecified chronic kidney disease: Secondary | ICD-10-CM | POA: Diagnosis not present

## 2016-01-02 DIAGNOSIS — E785 Hyperlipidemia, unspecified: Secondary | ICD-10-CM | POA: Diagnosis not present

## 2016-01-02 DIAGNOSIS — N183 Chronic kidney disease, stage 3 (moderate): Secondary | ICD-10-CM | POA: Diagnosis not present

## 2016-01-03 ENCOUNTER — Ambulatory Visit (INDEPENDENT_AMBULATORY_CARE_PROVIDER_SITE_OTHER): Payer: Commercial Managed Care - HMO | Admitting: Internal Medicine

## 2016-01-03 ENCOUNTER — Encounter: Payer: Self-pay | Admitting: Internal Medicine

## 2016-01-03 DIAGNOSIS — R739 Hyperglycemia, unspecified: Secondary | ICD-10-CM

## 2016-01-03 DIAGNOSIS — I10 Essential (primary) hypertension: Secondary | ICD-10-CM

## 2016-01-03 DIAGNOSIS — J4541 Moderate persistent asthma with (acute) exacerbation: Secondary | ICD-10-CM | POA: Diagnosis not present

## 2016-01-03 DIAGNOSIS — J45901 Unspecified asthma with (acute) exacerbation: Secondary | ICD-10-CM | POA: Insufficient documentation

## 2016-01-03 MED ORDER — PREDNISONE 10 MG PO TABS: ORAL_TABLET | ORAL | 0 refills | Status: DC

## 2016-01-03 MED ORDER — ALBUTEROL SULFATE HFA 108 (90 BASE) MCG/ACT IN AERS: 2.0000 | INHALATION_SPRAY | Freq: Four times a day (QID) | RESPIRATORY_TRACT | 3 refills | Status: DC | PRN

## 2016-01-03 NOTE — Assessment & Plan Note (Signed)
stable overall by history and exam, recent data reviewed with pt, and pt to continue medical treatment as before,  to f/u any worsening symptoms or concerns BP Readings from Last 3 Encounters:  01/03/16 122/80  12/11/15 118/74  02/20/15 130/78

## 2016-01-03 NOTE — Progress Notes (Signed)
Subjective:    Patient ID: Jake Simon, male    DOB: 1945/11/01, 70 y.o.   MRN: [id number]  HPI  Here with 3 days worsening mild to mod non prod cough, wheezing with sob/doe, without fever, HA, sinus congestion, CP or ST.  Symptoms worse with going up stairs and lying down at night.  Nohting else makes better or worse.  Not sure of any particular trigger, though has had similar episode a few yrs ago after raking leaves.  Normally takes the flovent once every other day as this seemed to be all he needed.  Does not have rescue inhaler to use  Pt denies orthopnea, PND, increased LE swelling, palpitations, dizziness or syncope.   Pt denies polydipsia, polyuria,  Past Medical History:  Diagnosis Date  . Allergic rhinitis   . Asthma   . HTN (hypertension)   . Hyperlipidemia    high triglycerides  . Sleep apnea    moderate  . Vision blurred    epithelial basement membrane dystrophy   Past Surgical History:  Procedure Laterality Date  . COLONOSCOPY    . dental implants    . HERNIA REPAIR    . POLYPECTOMY    . TONSILLECTOMY      reports that he has never smoked. He has never used smokeless tobacco. He reports that he drinks alcohol. He reports that he does not use drugs. family history includes Alzheimer's disease in his mother; Arthritis in his sister; Asthma in his father; Breast cancer in his mother; COPD in his father; Cancer in his sister; Mental illness in his mother; Stroke in his father. No Known Allergies Current Outpatient Prescriptions on File Prior to Visit  Medication Sig Dispense Refill  . aspirin 81 MG EC tablet Take 81 mg by mouth at bedtime. 30 min before niacin    . atorvastatin (LIPITOR) 20 MG tablet Take 1 tablet (20 mg total) by mouth daily. 90 tablet 3  . azithromycin (ZITHROMAX Z-PAK) 250 MG tablet 2 tab by mouth on day 1, then 1 per day 6 tablet 0  . CARTIA XT 180 MG 24 hr capsule TAKE 1 CAPSULE EVERY DAY 90 capsule 3  . fenofibrate 160 MG tablet TAKE 1  TABLET EVERY DAY 30 tablet 0  . fluticasone (FLOVENT HFA) 110 MCG/ACT inhaler Inhale 2 puffs into the lungs daily. 3 Inhaler 3  . furosemide (LASIX) 20 MG tablet TAKE 1 TABLET EVERY DAY 90 tablet 3  . gemfibrozil (LOPID) 600 MG tablet Take 1 tablet (600 mg total) by mouth 2 (two) times daily before a meal. 180 tablet 3  . levocetirizine (XYZAL) 5 MG tablet Take 1 tablet (5 mg total) by mouth daily as needed for allergies. 90 tablet 3  . losartan (COZAAR) 100 MG tablet Take 1 tablet (100 mg total) by mouth daily. 90 tablet 3  . Na Sulfate-K Sulfate-Mg Sulf (SUPREP BOWEL PREP) SOLN Take 1 kit by mouth once. suprep as directed. No substitutions 354 mL 0  . ondansetron (ZOFRAN) 4 MG tablet Take 1 tablet (4 mg total) by mouth every 6 (six) hours as needed for nausea. 20 tablet 0  . oxyCODONE (OXY IR/ROXICODONE) 5 MG immediate release tablet Take 1 tablet (5 mg total) by mouth every 4 (four) hours as needed for moderate pain. 30 tablet 0  . sildenafil (REVATIO) 20 MG tablet 2-5 tabs daily as needed 90 tablet 11  . sodium chloride (MURO 128) 5 % ophthalmic ointment Place 1 application into both eyes as  needed for irritation.    . triamcinolone (NASACORT AQ) 55 MCG/ACT AERO nasal inhaler Place 2 sprays into the nose daily. 1 Inhaler 12  . alum & mag hydroxide-simeth (MAALOX/MYLANTA) 200-200-20 MG/5ML suspension Take 30 mLs by mouth every 6 (six) hours as needed for indigestion or heartburn (dyspepsia). (Patient not taking: Reported on 01/03/2016) 355 mL 0   No current facility-administered medications on file prior to visit.    Review of Systems  Constitutional: Negative for unusual diaphoresis or night sweats HENT: Negative for ear swelling or discharge Eyes: Negative for worsening visual haziness  Respiratory: Negative for choking and stridor.   Gastrointestinal: Negative for distension or worsening eructation Genitourinary: Negative for retention or change in urine volume.  Musculoskeletal: Negative  for other MSK pain or swelling Skin: Negative for color change and worsening wound Neurological: Negative for tremors and numbness other than noted  Psychiatric/Behavioral: Negative for decreased concentration or agitation other than above   All other systems neg per pt.    Objective:   Physical Exam BP 122/80   Pulse (!) 104   Temp 98.8 F (37.1 C) (Oral)   Resp 20   Wt 158 lb (71.7 kg)   SpO2 96%   BMI 25.50 kg/m  VS noted,  Constitutional: Pt appears in no apparent distress HENT: Head: NCAT.  Right Ear: External ear normal.  Left Ear: External ear normal.  Eyes: . Pupils are equal, round, and reactive to light. Conjunctivae and EOM are normal Neck: Normal range of motion. Neck supple.  Cardiovascular: Normal rate and regular rhythm.   Pulmonary/Chest: Effort normal and breath sounds decreased without rales but with bilat wheezing.  Abd:  Soft, NT, ND, + BS Neurological: Pt is alert. Not confused , motor grossly intact Skin: Skin is warm. No rash, no LE edema Psychiatric: Pt behavior is normal. No agitation.  No other significant exam findings    Assessment & Plan:

## 2016-01-03 NOTE — Progress Notes (Signed)
Pre visit review using our clinic review tool, if applicable. No additional management support is needed unless otherwise documented below in the visit note. 

## 2016-01-03 NOTE — Assessment & Plan Note (Signed)
Mild to mod, for depomedrol IM, predpac asd, increase flovent to bid daily, and proair HFA prn rescue,  to f/u any worsening symptoms or concerns

## 2016-01-03 NOTE — Assessment & Plan Note (Signed)
stable overall by history and exam, recent data reviewed with pt, and pt to continue medical treatment as before,  to f/u any worsening symptoms or concerns Lab Results  Component Value Date   HGBA1C 5.5 12/11/2015   Pt to call for onset polys or cbg > 200 with tx

## 2016-01-03 NOTE — Patient Instructions (Signed)
You had the steroid shot today  OK to take the flovent twice daily for one wk, then less after that  Please take all new medication as prescribed - the prednisone, and the inhaler  Please continue all other medications as before, and refills have been done if requested.  Please have the pharmacy call with any other refills you may need.  Please keep your appointments with your specialists as you may have planned

## 2016-01-04 ENCOUNTER — Other Ambulatory Visit: Payer: Self-pay | Admitting: Nephrology

## 2016-01-04 DIAGNOSIS — I10 Essential (primary) hypertension: Secondary | ICD-10-CM

## 2016-01-04 DIAGNOSIS — N183 Chronic kidney disease, stage 3 unspecified: Secondary | ICD-10-CM

## 2016-01-10 ENCOUNTER — Other Ambulatory Visit: Payer: Commercial Managed Care - HMO

## 2016-01-15 ENCOUNTER — Encounter: Payer: Self-pay | Admitting: Internal Medicine

## 2016-01-16 ENCOUNTER — Ambulatory Visit
Admission: RE | Admit: 2016-01-16 | Discharge: 2016-01-16 | Disposition: A | Discharge status: Home / Self Care | Payer: Commercial Managed Care - HMO | Source: Ambulatory Visit | Attending: Nephrology | Admitting: Nephrology

## 2016-01-16 DIAGNOSIS — N183 Chronic kidney disease, stage 3 unspecified: Secondary | ICD-10-CM

## 2016-01-16 DIAGNOSIS — I10 Essential (primary) hypertension: Secondary | ICD-10-CM

## 2016-01-16 DIAGNOSIS — I129 Hypertensive chronic kidney disease with stage 1 through stage 4 chronic kidney disease, or unspecified chronic kidney disease: Secondary | ICD-10-CM | POA: Diagnosis not present

## 2016-02-08 DIAGNOSIS — E785 Hyperlipidemia, unspecified: Secondary | ICD-10-CM | POA: Diagnosis not present

## 2016-02-08 DIAGNOSIS — N183 Chronic kidney disease, stage 3 (moderate): Secondary | ICD-10-CM | POA: Diagnosis not present

## 2016-02-08 DIAGNOSIS — J45909 Unspecified asthma, uncomplicated: Secondary | ICD-10-CM | POA: Diagnosis not present

## 2016-02-08 DIAGNOSIS — I129 Hypertensive chronic kidney disease with stage 1 through stage 4 chronic kidney disease, or unspecified chronic kidney disease: Secondary | ICD-10-CM | POA: Diagnosis not present

## 2016-02-09 ENCOUNTER — Encounter: Payer: Self-pay | Admitting: Internal Medicine

## 2016-02-11 MED ORDER — FENOFIBRATE 160 MG PO TABS: 160.0000 mg | ORAL_TABLET | Freq: Every day | ORAL | 3 refills | Status: DC

## 2016-06-23 ENCOUNTER — Encounter: Payer: Self-pay | Admitting: Internal Medicine

## 2016-06-23 MED ORDER — AZITHROMYCIN 250 MG PO TABS: ORAL_TABLET | ORAL | 0 refills | Status: DC

## 2016-07-29 DIAGNOSIS — H1859 Other hereditary corneal dystrophies: Secondary | ICD-10-CM | POA: Diagnosis not present

## 2016-07-29 DIAGNOSIS — D3131 Benign neoplasm of right choroid: Secondary | ICD-10-CM | POA: Diagnosis not present

## 2016-07-29 DIAGNOSIS — H04123 Dry eye syndrome of bilateral lacrimal glands: Secondary | ICD-10-CM | POA: Diagnosis not present

## 2016-07-29 DIAGNOSIS — H2513 Age-related nuclear cataract, bilateral: Secondary | ICD-10-CM | POA: Diagnosis not present

## 2016-07-29 DIAGNOSIS — H10413 Chronic giant papillary conjunctivitis, bilateral: Secondary | ICD-10-CM | POA: Diagnosis not present

## 2016-09-08 ENCOUNTER — Ambulatory Visit: Payer: Commercial Managed Care - HMO

## 2016-09-29 NOTE — Progress Notes (Addendum)
Subjective:   Jake Simon is a 71 y.o. male who presents for Medicare Annual/Subsequent preventive examination.  Review of Systems:  No ROS.  Medicare Wellness Visit. Additional risk factors are reflected in the social history.    Sleep patterns: feels rested on waking, gets up 1 times nightly to void and sleeps 8-9 hours nightly.    Home Safety/Smoke Alarms: Feels safe in home. Smoke alarms in place.  Living environment; residence and Firearm Safety: 1-story house/ trailer, no firearms. Lives with wife, no needs for DME, good support system  Seat Belt Safety/Bike Helmet: Wears seat belt.   Counseling:   Eye Exam- appointment yearly, Dr.Grout [facility]- appointment every 6 months, Dr. Gilford Rile   Male:   [facility]S- Last 06/19/14, recall 10 years   PSA-  Lab Results  Component Value Date   PSA 0.71 12/11/2015   PSA 0.60 05/18/2009   PSA 0.34 04/25/2008       Objective:    Vitals: There were no vitals taken for this visit.  There is no height or weight on file to calculate BMI.  Tobacco History  Smoking Status  . Never Smoker  Smokeless Tobacco  . Never Used     Counseling given: Not Answered   Past Medical History:  Diagnosis Date  . Allergic rhinitis   . Asthma   . HTN (hypertension)   . Hyperlipidemia    high triglycerides  . Sleep apnea    moderate  . Vision blurred    epithelial basement membrane dystrophy   Past Surgical History:  Procedure Laterality Date  . COLONOSCOPY    . dental implants    . HERNIA REPAIR    . POLYPECTOMY    . TONSILLECTOMY     Family History  Problem Relation Age of Onset  . Alzheimer's disease Mother   . Breast cancer Mother   . Mental illness Mother        alzheimers  . COPD Father   . Asthma Father   . Stroke Father   . Diabetes Neg Hx   . Prostate cancer Neg Hx   . Colon cancer Neg Hx   . Heart disease Neg Hx   . Hyperlipidemia Neg Hx   . Rectal cancer Neg Hx   . Stomach cancer Neg Hx   . Cancer Sister    brain cancer  . Arthritis Sister    History  Sexual Activity  . Sexual activity: Not Currently    Outpatient Encounter Prescriptions as of 09/30/2016  Medication Sig  . albuterol (PROVENTIL HFA;VENTOLIN HFA) 108 (90 Base) MCG/ACT inhaler Inhale 2 puffs into the lungs every 6 (six) hours as needed for wheezing or shortness of breath.  Marland Kitchen alum & mag hydroxide-simeth (MAALOX/MYLANTA) 200-200-20 MG/5ML suspension Take 30 mLs by mouth every 6 (six) hours as needed for indigestion or heartburn (dyspepsia). (Patient not taking: Reported on 01/03/2016)  . aspirin 81 MG EC tablet Take 81 mg by mouth at bedtime. 30 min before niacin  . atorvastatin (LIPITOR) 20 MG tablet Take 1 tablet (20 mg total) by mouth daily.  Marland Kitchen azithromycin (ZITHROMAX Z-PAK) 250 MG tablet 2 tab by mouth on day 1, then 1 per day  . CARTIA XT 180 MG 24 hr capsule TAKE 1 CAPSULE EVERY DAY  . fenofibrate 160 MG tablet Take 1 tablet (160 mg total) by mouth daily.  . fluticasone (FLOVENT HFA) 110 MCG/ACT inhaler Inhale 2 puffs into the lungs daily.  . furosemide (LASIX) 20 MG tablet TAKE 1  TABLET EVERY DAY  . gemfibrozil (LOPID) 600 MG tablet Take 1 tablet (600 mg total) by mouth 2 (two) times daily before a meal.  . levocetirizine (XYZAL) 5 MG tablet Take 1 tablet (5 mg total) by mouth daily as needed for allergies.  Marland Kitchen losartan (COZAAR) 100 MG tablet Take 1 tablet (100 mg total) by mouth daily.  . Na Sulfate-K Sulfate-Mg Sulf (SUPREP BOWEL PREP) SOLN Take 1 kit by mouth once. suprep as directed. No substitutions  . ondansetron (ZOFRAN) 4 MG tablet Take 1 tablet (4 mg total) by mouth every 6 (six) hours as needed for nausea.  Marland Kitchen oxyCODONE (OXY IR/ROXICODONE) 5 MG immediate release tablet Take 1 tablet (5 mg total) by mouth every 4 (four) hours as needed for moderate pain.  . predniSONE (DELTASONE) 10 MG tablet 4 tab by mouth x3day,3tab x 3day,2tab x 3day, 1tab x 3 day  . sildenafil (REVATIO) 20 MG tablet 2-5 tabs daily as needed  .  sodium chloride (MURO 128) 5 % ophthalmic ointment Place 1 application into both eyes as needed for irritation.  . triamcinolone (NASACORT AQ) 55 MCG/ACT AERO nasal inhaler Place 2 sprays into the nose daily.   No facility-administered encounter medications on file as of 09/30/2016.     Activities of Daily Living No flowsheet data found.  Patient Care Team: Biagio Borg, MD as PCP - General (Internal Medicine) Inda Castle, MD (Gastroenterology) Clent Jacks, MD (Ophthalmology) Carolan Clines, MD (Urology) Jarome Matin, MD as Consulting Physician (Dermatology)   Assessment:    Physical assessment deferred to PCP.  Exercise Activities and Dietary recommendations   Diet (meal preparation, eat out, water intake, caffeinated beverages, dairy products, fruits and vegetables): in general, a "healthy" diet  , well balanced, low fat/ cholesterol, low salt  encouraged patient to increase daily water intake.   Goals    . Exercise 150 minutes per week (moderate activity)          HDL 31 and goal at least 40; Will walk an add'l 30 minutes 3 days a week;       Fall Risk Fall Risk  12/11/2015 05/02/2015 09/20/2013  Falls in the past year? No No No   Depression Screen PHQ 2/9 Scores 12/11/2015 05/02/2015 09/20/2013  PHQ - 2 Score 0 0 0    Cognitive Function MMSE - Mini Mental State Exam 05/02/2015  Not completed: (No Data)       Ad8 score reviewed for issues:  Issues making decisions: no  Less interest in hobbies / activities: no  Repeats questions, stories (family complaining): no  Trouble using ordinary gadgets (microwave, computer, phone):no  Forgets the month or year: no  Mismanaging finances: no  Remembering appts: no  Daily problems with thinking and/or memory: no Ad8 score is= 0  Immunization History  Administered Date(s) Administered  . H1N1 02/15/2008  . Influenza Split 12/10/2010, 11/11/2011  . Influenza Whole 01/05/2008, 12/12/2008, 12/11/2009  .  Influenza, High Dose Seasonal PF 12/08/2013, 11/22/2015  . Influenza,inj,Quad PF,36+ Mos 11/23/2012, 11/16/2014  . Pneumococcal Conjugate-13 09/20/2013  . Pneumococcal Polysaccharide-23 05/22/2009  . Td 05/02/2008  . Zoster 08/20/2009   Screening Tests Health Maintenance  Topic Date Due  . Hepatitis C Screening  22-Apr-1945  . INFLUENZA VACCINE  10/01/2016  . TETANUS/TDAP  05/03/2018  . COLONOSCOPY  06/18/2024      Plan:   Continue doing brain stimulating activities (puzzles, reading, adult coloring books, staying active) to keep memory sharp.   Continue to eat heart  healthy diet (full of fruits, vegetables, whole grains, lean protein, water--limit salt, fat, and sugar intake) and increase physical activity as tolerated.   I have personally reviewed and noted the following in the patient's chart:   . Medical and social history . Use of alcohol, tobacco or illicit drugs  . Current medications and supplements . Functional ability and status . Nutritional status . Physical activity . Advanced directives . List of other physicians . Vitals . Screenings to include cognitive, depression, and falls . Referrals and appointments  In addition, I have reviewed and discussed with patient certain preventive protocols, quality metrics, and best practice recommendations. A written personalized care plan for preventive services as well as general preventive health recommendations were provided to patient.     Michiel Cowboy, RN  09/29/2016  Medical screening examination/treatment/procedure(s) were performed by non-physician practitioner and as supervising physician I was immediately available for consultation/collaboration. I agree with above. Cathlean Cower, MD

## 2016-09-29 NOTE — Progress Notes (Signed)
Pre visit review using our clinic review tool, if applicable. No additional management support is needed unless otherwise documented below in the visit note. 

## 2016-09-30 ENCOUNTER — Ambulatory Visit (INDEPENDENT_AMBULATORY_CARE_PROVIDER_SITE_OTHER): Payer: Medicare HMO | Admitting: *Deleted

## 2016-09-30 ENCOUNTER — Other Ambulatory Visit: Payer: Medicare HMO

## 2016-09-30 ENCOUNTER — Telehealth: Payer: Self-pay | Admitting: *Deleted

## 2016-09-30 VITALS — BP 124/74 | HR 86 | Resp 20 | Ht 66.0 in | Wt 166.0 lb

## 2016-09-30 DIAGNOSIS — Z0000 Encounter for general adult medical examination without abnormal findings: Secondary | ICD-10-CM | POA: Diagnosis not present

## 2016-09-30 DIAGNOSIS — Z9189 Other specified personal risk factors, not elsewhere classified: Secondary | ICD-10-CM | POA: Diagnosis not present

## 2016-09-30 DIAGNOSIS — Z1159 Encounter for screening for other viral diseases: Secondary | ICD-10-CM

## 2016-09-30 LAB — HEPATITIS C ANTIBODY: HCV Ab: NONREACTIVE

## 2016-09-30 MED ORDER — ZOSTER VAC RECOMB ADJUVANTED 50 MCG/0.5ML IM SUSR: 0.5000 mL | Freq: Once | INTRAMUSCULAR | 1 refills | Status: AC

## 2016-09-30 MED ORDER — ATORVASTATIN CALCIUM 20 MG PO TABS: 20.0000 mg | ORAL_TABLET | Freq: Every day | ORAL | 3 refills | Status: DC

## 2016-09-30 MED ORDER — DILTIAZEM HCL ER COATED BEADS 180 MG PO CP24: 180.0000 mg | ORAL_CAPSULE | Freq: Every day | ORAL | 3 refills | Status: DC

## 2016-09-30 NOTE — Telephone Encounter (Signed)
During AWV, patient stated that he needed 90 day refills for Cartia XT and Atorvastatin. He would like for both of the prescriptions to be sent to [facility].

## 2016-09-30 NOTE — Patient Instructions (Signed)
Continue doing brain stimulating activities (puzzles, reading, adult coloring books, staying active) to keep memory sharp.   Continue to eat heart healthy diet (full of fruits, vegetables, whole grains, lean protein, water--limit salt, fat, and sugar intake) and increase physical activity as tolerated.   [name] , Thank you for taking time to come for your Medicare Wellness Visit. I appreciate your ongoing commitment to your health goals. Please review the following plan we discussed and let me know if I can assist you in the future.   These are the goals we discussed: Goals    . Exercise 150 minutes per week (moderate activity)          HDL 31 and goal at least 40; Will walk an add'l 30 minutes 3 days a week;     Marland Kitchen Maintain current health status          Continue to be healthy as possible by eating healthy, exercising, worshiping God, enjoy life and family       This is a list of the screening recommended for you and due dates:  Health Maintenance  Topic Date Due  .  Hepatitis C: One time screening is recommended by Center for Disease Control  (CDC) for  adults born from [birth year] through 1966.   04-22-1945  . Flu Shot  10/01/2016  . Tetanus Vaccine  05/03/2018  . Colon Cancer Screening  06/18/2024

## 2016-09-30 NOTE — Telephone Encounter (Signed)
Ok to forward to shirron for routine refills, thanks

## 2016-09-30 NOTE — Telephone Encounter (Signed)
Done

## 2016-10-06 ENCOUNTER — Other Ambulatory Visit: Payer: Self-pay | Admitting: Internal Medicine

## 2016-11-24 DIAGNOSIS — N183 Chronic kidney disease, stage 3 (moderate): Secondary | ICD-10-CM | POA: Diagnosis not present

## 2016-11-24 DIAGNOSIS — E785 Hyperlipidemia, unspecified: Secondary | ICD-10-CM | POA: Diagnosis not present

## 2016-11-24 DIAGNOSIS — I129 Hypertensive chronic kidney disease with stage 1 through stage 4 chronic kidney disease, or unspecified chronic kidney disease: Secondary | ICD-10-CM | POA: Diagnosis not present

## 2016-11-24 DIAGNOSIS — J45909 Unspecified asthma, uncomplicated: Secondary | ICD-10-CM | POA: Diagnosis not present

## 2016-11-25 ENCOUNTER — Ambulatory Visit (INDEPENDENT_AMBULATORY_CARE_PROVIDER_SITE_OTHER): Payer: Medicare HMO | Admitting: General Practice

## 2016-11-25 DIAGNOSIS — Z23 Encounter for immunization: Secondary | ICD-10-CM | POA: Diagnosis not present

## 2016-12-15 ENCOUNTER — Other Ambulatory Visit: Payer: Self-pay | Admitting: Internal Medicine

## 2017-02-17 ENCOUNTER — Ambulatory Visit (INDEPENDENT_AMBULATORY_CARE_PROVIDER_SITE_OTHER): Payer: Medicare HMO | Admitting: Internal Medicine

## 2017-02-17 ENCOUNTER — Encounter: Payer: Self-pay | Admitting: Internal Medicine

## 2017-02-17 VITALS — BP 122/80 | HR 89 | Temp 98.1°F | Ht 66.0 in | Wt 163.0 lb

## 2017-02-17 DIAGNOSIS — Z0000 Encounter for general adult medical examination without abnormal findings: Secondary | ICD-10-CM

## 2017-02-17 DIAGNOSIS — J4541 Moderate persistent asthma with (acute) exacerbation: Secondary | ICD-10-CM

## 2017-02-17 DIAGNOSIS — E785 Hyperlipidemia, unspecified: Secondary | ICD-10-CM | POA: Diagnosis not present

## 2017-02-17 DIAGNOSIS — I10 Essential (primary) hypertension: Secondary | ICD-10-CM

## 2017-02-17 DIAGNOSIS — H9191 Unspecified hearing loss, right ear: Secondary | ICD-10-CM

## 2017-02-17 DIAGNOSIS — R739 Hyperglycemia, unspecified: Secondary | ICD-10-CM

## 2017-02-17 DIAGNOSIS — Z0001 Encounter for general adult medical examination with abnormal findings: Secondary | ICD-10-CM

## 2017-02-17 MED ORDER — LOSARTAN POTASSIUM 50 MG PO TABS: 50.0000 mg | ORAL_TABLET | Freq: Every day | ORAL | 3 refills | Status: DC

## 2017-02-17 MED ORDER — FENOFIBRATE 160 MG PO TABS: 160.0000 mg | ORAL_TABLET | Freq: Every day | ORAL | 3 refills | Status: DC

## 2017-02-17 MED ORDER — ATORVASTATIN CALCIUM 20 MG PO TABS: 20.0000 mg | ORAL_TABLET | Freq: Every day | ORAL | 3 refills | Status: DC

## 2017-02-17 MED ORDER — FLUTICASONE PROPIONATE HFA 110 MCG/ACT IN AERO: INHALATION_SPRAY | RESPIRATORY_TRACT | 5 refills | Status: DC

## 2017-02-17 MED ORDER — ALBUTEROL SULFATE HFA 108 (90 BASE) MCG/ACT IN AERS: 2.0000 | INHALATION_SPRAY | Freq: Four times a day (QID) | RESPIRATORY_TRACT | 5 refills | Status: DC | PRN

## 2017-02-17 NOTE — Progress Notes (Signed)
Subjective:    Patient ID: Jake Simon, male    DOB: 06/30/1945, 72 y.o.   MRN: [id number]  HPI  Here for wellness and f/u;  Overall doing ok;  Pt denies Chest pain, worsening SOB, DOE, wheezing, orthopnea, PND, worsening LE edema, palpitations, dizziness or syncope.  Pt denies neurological change such as new headache, facial or extremity weakness.  Pt denies polydipsia, polyuria, or low sugar symptoms. Pt states overall good compliance with treatment and medications, good tolerability, and has been trying to follow appropriate diet.  Pt denies worsening depressive symptoms, suicidal ideation or panic. No fever, night sweats, wt loss, loss of appetite, or other constitutional symptoms.  Pt states good ability with ADL's, has low fall risk, home safety reviewed and adequate, no other significant changes in hearing or vision, and only occasionally active with exercise. Losartan decresaed from 100 to 50 per Dr Shan Levans recently. Did have recent fall asthma flare now resolved.  Having some decreased right hearing in the past wk - ? Wax again. Past Medical History:  Diagnosis Date  . Allergic rhinitis   . Asthma   . HTN (hypertension)   . Hyperlipidemia    high triglycerides  . Sleep apnea    moderate  . Vision blurred    epithelial basement membrane dystrophy   Past Surgical History:  Procedure Laterality Date  . COLONOSCOPY    . dental implants    . HERNIA REPAIR    . POLYPECTOMY    . TONSILLECTOMY      reports that  has never smoked. he has never used smokeless tobacco. He reports that he drinks alcohol. He reports that he does not use drugs. family history includes Alzheimer's disease in his mother; Arthritis in his sister; Asthma in his father; Breast cancer in his mother; COPD in his father; Cancer in his sister; Mental illness in his mother; Stroke in his father. No Known Allergies Current Outpatient Medications on File Prior to Visit  Medication Sig Dispense Refill  .  aspirin 81 MG EC tablet Take 81 mg by mouth at bedtime. 30 min before niacin    . azithromycin (ZITHROMAX Z-PAK) 250 MG tablet 2 tab by mouth on day 1, then 1 per day 6 tablet 0  . diltiazem (CARTIA XT) 180 MG 24 hr capsule Take 1 capsule (180 mg total) by mouth daily. 90 capsule 3  . furosemide (LASIX) 20 MG tablet TAKE 1 TABLET EVERY DAY 90 tablet 3  . sildenafil (REVATIO) 20 MG tablet 2-5 tabs daily as needed 90 tablet 11  . sodium chloride (MURO 128) 5 % ophthalmic ointment Place 1 application into both eyes as needed for irritation.     No current facility-administered medications on file prior to visit.    Review of Systems Constitutional: Negative for other unusual diaphoresis, sweats, appetite or weight changes HENT: Negative for other worsening hearing loss, ear pain, facial swelling, mouth sores or neck stiffness.   Eyes: Negative for other worsening pain, redness or other visual disturbance.  Respiratory: Negative for other stridor or swelling Cardiovascular: Negative for other palpitations or other chest pain  Gastrointestinal: Negative for worsening diarrhea or loose stools, blood in stool, distention or other pain Genitourinary: Negative for hematuria, flank pain or other change in urine volume.  Musculoskeletal: Negative for myalgias or other joint swelling.  Skin: Negative for other color change, or other wound or worsening drainage.  Neurological: Negative for other syncope or numbness. Hematological: Negative for other adenopathy or swelling  Psychiatric/Behavioral: Negative for hallucinations, other worsening agitation, SI, self-injury, or new decreased concentration \\All  other system neg per pt    Objective:   Physical Exam BP 122/80   Pulse 89   Temp 98.1 F (36.7 C) (Oral)   Ht 5\' 6"  (1.676 m)   Wt 163 lb (73.9 kg)   SpO2 98%   BMI 26.31 kg/m  VS noted,  Constitutional: Pt is oriented to person, place, and time. Appears well-developed and well-nourished, in no  significant distress and comfortable Head: Normocephalic and atraumatic  Eyes: Conjunctivae and EOM are normal. Pupils are equal, round, and reactive to light Right Ear: External ear normal without discharge Right canal cleared of wax impaction with irrigation and hearin gimproved Left Ear: External ear normal without discharge Nose: Nose without discharge or deformity Mouth/Throat: Oropharynx is without other ulcerations and moist  Neck: Normal range of motion. Neck supple. No JVD present. No tracheal deviation present or significant neck LA or mass Cardiovascular: Normal rate, regular rhythm, normal heart sounds and intact distal pulses.   Pulmonary/Chest: WOB normal and breath sounds without rales or wheezing  Abdominal: Soft. Bowel sounds are normal. NT. No HSM  Musculoskeletal: Normal range of motion. Exhibits no edema Lymphadenopathy: Has no other cervical adenopathy.  Neurological: Pt is alert and oriented to person, place, and time. Pt has normal reflexes. No cranial nerve deficit. Motor grossly intact, Gait intact Skin: Skin is warm and dry. No rash noted or new ulcerations Psychiatric:  Has normal mood and affect. Behavior is normal without agitation No other exam findings Lab Results  Component Value Date   WBC 8.9 02/18/2017   HGB 16.0 02/18/2017   HCT 46.9 02/18/2017   PLT 215.0 02/18/2017   GLUCOSE 90 02/18/2017   CHOL 162 02/18/2017   TRIG 131.0 02/18/2017   HDL 41.40 02/18/2017   LDLDIRECT 149.0 12/11/2015   LDLCALC 94 02/18/2017   ALT 19 02/18/2017   AST 17 02/18/2017   NA 140 02/18/2017   K 4.1 02/18/2017   CL 105 02/18/2017   CREATININE 1.83 (H) 02/18/2017   BUN 23 02/18/2017   CO2 26 02/18/2017   TSH 2.60 02/18/2017   PSA 0.63 02/18/2017   HGBA1C 5.9 02/18/2017         Assessment & Plan:

## 2017-02-17 NOTE — Patient Instructions (Signed)
Your right ear was cleared today  Please continue all other medications as before, and refills have been done if requested.  Please have the pharmacy call with any other refills you may need.  Please continue your efforts at being more active, low cholesterol diet, and weight control.  You are otherwise up to date with prevention measures today.  Please keep your appointments with your specialists as you may have planned  Please go to the LAB in the Basement (turn left off the elevator) for the tests to be done today  You will be contacted by phone if any changes need to be made immediately.  Otherwise, you will receive a letter about your results with an explanation, but please check with MyChart first.  Please remember to sign up for MyChart if you have not done so, as this will be important to you in the future with finding out test results, communicating by private email, and scheduling acute appointments online when needed.  Please return in 1 year for your yearly visit, or sooner if needed, with Lab testing done 3-5 days before

## 2017-02-18 ENCOUNTER — Other Ambulatory Visit (INDEPENDENT_AMBULATORY_CARE_PROVIDER_SITE_OTHER): Payer: Medicare HMO

## 2017-02-18 DIAGNOSIS — Z0000 Encounter for general adult medical examination without abnormal findings: Secondary | ICD-10-CM

## 2017-02-18 DIAGNOSIS — R739 Hyperglycemia, unspecified: Secondary | ICD-10-CM

## 2017-02-18 DIAGNOSIS — E785 Hyperlipidemia, unspecified: Secondary | ICD-10-CM | POA: Diagnosis not present

## 2017-02-18 LAB — LIPID PANEL
CHOLESTEROL: 162 mg/dL (ref 0–200)
HDL: 41.4 mg/dL (ref >39.00)
LDL Cholesterol: 94 mg/dL (ref 0–99)
NonHDL: 120.26
TRIGLYCERIDES: 131 mg/dL (ref 0.0–149.0)
Total CHOL/HDL Ratio: 4
VLDL: 26.2 mg/dL (ref 0.0–40.0)

## 2017-02-18 LAB — CBC WITH DIFFERENTIAL/PLATELET
BASOS ABS: 0.1 10*3/uL (ref 0.0–0.1)
Basophils Relative: 1.3 % (ref 0.0–3.0)
Eosinophils Absolute: 1.1 10*3/uL — ABNORMAL HIGH (ref 0.0–0.7)
Eosinophils Relative: 12 % — ABNORMAL HIGH (ref 0.0–5.0)
HCT: 46.9 % (ref 39.0–52.0)
Hemoglobin: 16 g/dL (ref 13.0–17.0)
LYMPHS ABS: 2.2 10*3/uL (ref 0.7–4.0)
Lymphocytes Relative: 25 % (ref 12.0–46.0)
MCHC: 34.1 g/dL (ref 30.0–36.0)
MCV: 90.1 fl (ref 78.0–100.0)
MONO ABS: 0.8 10*3/uL (ref 0.1–1.0)
MONOS PCT: 8.8 % (ref 3.0–12.0)
NEUTROS ABS: 4.7 10*3/uL (ref 1.4–7.7)
NEUTROS PCT: 52.9 % (ref 43.0–77.0)
PLATELETS: 215 10*3/uL (ref 150.0–400.0)
RBC: 5.21 Mil/uL (ref 4.22–5.81)
RDW: 13.4 % (ref 11.5–15.5)
WBC: 8.9 10*3/uL (ref 4.0–10.5)

## 2017-02-18 LAB — HEPATIC FUNCTION PANEL
ALBUMIN: 4.3 g/dL (ref 3.5–5.2)
ALK PHOS: 40 U/L (ref 39–117)
ALT: 19 U/L (ref 0–53)
AST: 17 U/L (ref 0–37)
BILIRUBIN DIRECT: 0.2 mg/dL (ref 0.0–0.3)
TOTAL PROTEIN: 6.9 g/dL (ref 6.0–8.3)
Total Bilirubin: 0.8 mg/dL (ref 0.2–1.2)

## 2017-02-18 LAB — URINALYSIS, ROUTINE W REFLEX MICROSCOPIC
BILIRUBIN URINE: NEGATIVE
Hgb urine dipstick: NEGATIVE
Ketones, ur: NEGATIVE
Leukocytes, UA: NEGATIVE
Nitrite: NEGATIVE
PH: 6 (ref 5.0–8.0)
SPECIFIC GRAVITY, URINE: 1.02 (ref 1.000–1.030)
TOTAL PROTEIN, URINE-UPE24: NEGATIVE
Urine Glucose: NEGATIVE
Urobilinogen, UA: 0.2 (ref 0.0–1.0)

## 2017-02-18 LAB — BASIC METABOLIC PANEL
BUN: 23 mg/dL (ref 6–23)
CALCIUM: 8.9 mg/dL (ref 8.4–10.5)
CHLORIDE: 105 meq/L (ref 96–112)
CO2: 26 mEq/L (ref 19–32)
CREATININE: 1.83 mg/dL — AB (ref 0.40–1.50)
GFR: 38.86 mL/min — ABNORMAL LOW (ref >60.00)
Glucose, Bld: 90 mg/dL (ref 70–99)
Potassium: 4.1 mEq/L (ref 3.5–5.1)
SODIUM: 140 meq/L (ref 135–145)

## 2017-02-18 LAB — PSA: PSA: 0.63 ng/mL (ref 0.10–4.00)

## 2017-02-18 LAB — HEMOGLOBIN A1C: Hgb A1c MFr Bld: 5.9 % (ref 4.6–6.5)

## 2017-02-18 LAB — TSH: TSH: 2.6 u[IU]/mL (ref 0.35–4.50)

## 2017-02-18 NOTE — Assessment & Plan Note (Signed)

## 2017-02-18 NOTE — Assessment & Plan Note (Signed)
Mild recent now resolved

## 2017-02-18 NOTE — Assessment & Plan Note (Signed)
BP Readings from Last 3 Encounters:  02/17/17 122/80  09/30/16 124/74  01/03/16 122/80  stable overall by history and exam, recent data reviewed with pt, and pt to continue medical treatment as before,  to f/u any worsening symptoms or concerns

## 2017-02-18 NOTE — Assessment & Plan Note (Signed)
Lab Results  Component Value Date   HGBA1C 5.9 02/18/2017  stable overall by history and exam, recent data reviewed with pt, and pt to continue medical treatment as before,  to f/u any worsening symptoms or concerns

## 2017-02-18 NOTE — Assessment & Plan Note (Signed)
Lab Results  Component Value Date   LDLCALC 94 02/18/2017  stable overall by history and exam, recent data reviewed with pt, and pt to continue medical treatment as before,  to f/u any worsening symptoms or concerns

## 2017-02-18 NOTE — Assessment & Plan Note (Addendum)
Resolved with irrigation,  to f/u any worsening symptoms or concerns  In addition to the time spent performing CPE, I spent an additional 15 minutes face to face,in which greater than 50% of this time was spent in counseling and coordination of care for patient's illness as documented, including the differential dx, treatment, further evaluation and other management of acute right hearing loss, asthma exacerbation, HTN, HLD, hyperglycemia

## 2017-04-08 ENCOUNTER — Encounter: Payer: Self-pay | Admitting: Internal Medicine

## 2017-04-08 ENCOUNTER — Ambulatory Visit (INDEPENDENT_AMBULATORY_CARE_PROVIDER_SITE_OTHER): Payer: Medicare HMO | Admitting: Internal Medicine

## 2017-04-08 VITALS — BP 134/86 | HR 95 | Temp 98.4°F | Ht 66.0 in | Wt 160.0 lb

## 2017-04-08 DIAGNOSIS — J069 Acute upper respiratory infection, unspecified: Secondary | ICD-10-CM | POA: Diagnosis not present

## 2017-04-08 DIAGNOSIS — J4541 Moderate persistent asthma with (acute) exacerbation: Secondary | ICD-10-CM

## 2017-04-08 DIAGNOSIS — L309 Dermatitis, unspecified: Secondary | ICD-10-CM | POA: Diagnosis not present

## 2017-04-08 MED ORDER — HYDROCODONE-HOMATROPINE 5-1.5 MG/5ML PO SYRP: 5.0000 mL | ORAL_SOLUTION | Freq: Four times a day (QID) | ORAL | 0 refills | Status: AC | PRN

## 2017-04-08 MED ORDER — PREDNISONE 10 MG PO TABS: ORAL_TABLET | ORAL | 0 refills | Status: DC

## 2017-04-08 MED ORDER — METHYLPREDNISOLONE ACETATE 80 MG/ML IJ SUSP
80.0000 mg | Freq: Once | INTRAMUSCULAR | Status: AC
  Administered 2017-04-08: 80 mg via INTRAMUSCULAR

## 2017-04-08 MED ORDER — AZITHROMYCIN 250 MG PO TABS: ORAL_TABLET | ORAL | 1 refills | Status: DC

## 2017-04-08 MED ORDER — TRIAMCINOLONE ACETONIDE 0.1 % EX CREA: 1.0000 "application " | TOPICAL_CREAM | Freq: Two times a day (BID) | CUTANEOUS | 0 refills | Status: AC

## 2017-04-08 NOTE — Progress Notes (Signed)
Subjective:    Patient ID: Jake Simon, male    DOB: [birth year], 72 y.o.   MRN: [id number]  HPI   Here with 2-3 days acute onset fever, facial pain, pressure, headache, general weakness and malaise, and greenish d/c, with mild ST and cough, but pt denies chest pain, wheezing, increased sob or doe, orthopnea, PND, increased LE swelling, palpitations, dizziness or syncope, except for onset mild wheezing and sob since last PM.  Also has right occipital area nontender but erythem scaly rash, now with some excoriations due to scratching but no worsening swelling or drainage.   Pt denies polydipsia, polyuria Past Medical History:  Diagnosis Date  . Allergic rhinitis   . Asthma   . HTN (hypertension)   . Hyperlipidemia    high triglycerides  . Sleep apnea    moderate  . Vision blurred    epithelial basement membrane dystrophy   Past Surgical History:  Procedure Laterality Date  . COLONOSCOPY    . dental implants    . HERNIA REPAIR    . POLYPECTOMY    . TONSILLECTOMY      reports that  has never smoked. he has never used smokeless tobacco. He reports that he drinks alcohol. He reports that he does not use drugs. family history includes Alzheimer's disease in his mother; Arthritis in his sister; Asthma in his father; Breast cancer in his mother; COPD in his father; Cancer in his sister; Mental illness in his mother; Stroke in his father. No Known Allergies Current Outpatient Medications on File Prior to Visit  Medication Sig Dispense Refill  . albuterol (PROVENTIL HFA;VENTOLIN HFA) 108 (90 Base) MCG/ACT inhaler Inhale 2 puffs into the lungs every 6 (six) hours as needed for wheezing or shortness of breath. 1 Inhaler 5  . aspirin 81 MG EC tablet Take 81 mg by mouth at bedtime. 30 min before niacin    . atorvastatin (LIPITOR) 20 MG tablet Take 1 tablet (20 mg total) by mouth daily. 90 tablet 3  . diltiazem (CARTIA XT) 180 MG 24 hr capsule Take 1 capsule (180 mg total) by mouth daily. 90  capsule 3  . fenofibrate 160 MG tablet Take 1 tablet (160 mg total) by mouth daily. 90 tablet 3  . fluticasone (FLOVENT HFA) 110 MCG/ACT inhaler USE 2 PUFFS DAILY 12 g 5  . furosemide (LASIX) 20 MG tablet TAKE 1 TABLET EVERY DAY 90 tablet 3  . losartan (COZAAR) 50 MG tablet Take 1 tablet (50 mg total) by mouth daily. 90 tablet 3  . sildenafil (REVATIO) 20 MG tablet 2-5 tabs daily as needed 90 tablet 11  . sodium chloride (MURO 128) 5 % ophthalmic ointment Place 1 application into both eyes as needed for irritation.     No current facility-administered medications on file prior to visit.    Review of Systems  Constitutional: Negative for other unusual diaphoresis or sweats HENT: Negative for ear discharge or swelling Eyes: Negative for other worsening visual disturbances Respiratory: Negative for stridor or other swelling  Gastrointestinal: Negative for worsening distension or other blood Genitourinary: Negative for retention or other urinary change Musculoskeletal: Negative for other MSK pain or swelling Skin: Negative for color change or other new lesions Neurological: Negative for worsening tremors and other numbness  Psychiatric/Behavioral: Negative for worsening agitation or other fatigue\ All other system neg per pt    Objective:   Physical Exam BP 134/86   Pulse 95   Temp 98.4 F (36.9 C) (Oral)  Ht 5\' 6"  (1.676 m)   Wt 160 lb (72.6 kg)   SpO2 96%   BMI 25.82 kg/m  VS noted, mild ill Constitutional: Pt appears in NAD HENT: Head: NCAT.  Right Ear: External ear normal.  Left Ear: External ear normal.  Eyes: . Pupils are equal, round, and reactive to light. Conjunctivae and EOM are normal Bilat tm's with mild erythema.  Max sinus areas mild tender.  Pharynx with mild erythema, no exudate Nose: without d/c or deformity Neck: Neck supple. Gross normal ROM Cardiovascular: Normal rate and regular rhythm.   Pulmonary/Chest: Effort normal and breath sounds decreased without  rales but with mild diffuse wheezing.  Neurological: Pt is alert. At baseline orientation, motor grossly intact Skin: Skin is warm. + nontender scaly right occipital rash approx 1.5 cm, no other new lesions, no LE edema Psychiatric: Pt behavior is normal without agitation  No other exam findings    Assessment & Plan:

## 2017-04-08 NOTE — Patient Instructions (Addendum)
You had the steroid shot today  Please take all new medication as prescribed - the antibiotic, cough medicine if needed, and the prednisone  Please take all new medication as prescribed - also the steroid cream for the dermatitis  Please continue all other medications as before, and refills have been done if requested.  Please have the pharmacy call with any other refills you may need.  Please keep your appointments with your specialists as you may have planned  Have a Good Time with your [place] trip!

## 2017-04-09 NOTE — Assessment & Plan Note (Signed)
Mild to mod, for steroid cream prn,  to f/u any worsening symptoms or concerns

## 2017-04-09 NOTE — Assessment & Plan Note (Signed)
Mild to mod, for depomedrol IM 80, predpac asd, to f/u any worsening symptoms or concerns 

## 2017-04-09 NOTE — Assessment & Plan Note (Signed)
Mild to mod, for antibx course, cough med prn, to f/u any worsening symptoms or concerns 

## 2017-05-11 ENCOUNTER — Encounter: Payer: Self-pay | Admitting: Internal Medicine

## 2017-06-13 ENCOUNTER — Encounter: Payer: Self-pay | Admitting: Internal Medicine

## 2017-06-15 ENCOUNTER — Ambulatory Visit (INDEPENDENT_AMBULATORY_CARE_PROVIDER_SITE_OTHER): Payer: Medicare HMO | Admitting: Family

## 2017-06-15 ENCOUNTER — Encounter: Payer: Self-pay | Admitting: Family

## 2017-06-15 VITALS — BP 116/72 | HR 84 | Temp 98.4°F | Ht 66.0 in | Wt 161.0 lb

## 2017-06-15 DIAGNOSIS — A084 Viral intestinal infection, unspecified: Secondary | ICD-10-CM

## 2017-06-15 MED ORDER — ONDANSETRON HCL 4 MG PO TABS: 4.0000 mg | ORAL_TABLET | Freq: Three times a day (TID) | ORAL | 0 refills | Status: DC | PRN

## 2017-06-15 NOTE — Progress Notes (Signed)
Jake Simon is a 72 y.o. male with the following history as recorded in [employer]:  Patient Active Problem List   Diagnosis Date Noted  . Dermatitis 04/08/2017  . Acute upper respiratory infection 04/08/2017  . Asthma with exacerbation 01/03/2016  . Acute hearing loss, right 12/15/2015  . Hyperglycemia 12/11/2015  . CKD (chronic kidney disease) stage 3, GFR 30-59 ml/min (HCC) 02/20/2015  . Chest pain 02/11/2015  . Atypical chest pain 02/11/2015  . Epigastric pain   . Allergic rhinitis 10/12/2014  . Hearing loss sensory, bilateral 10/12/2014  . Skin lesion 09/20/2013  . Erectile dysfunction 02/06/2013  . Asthma, chronic 08/04/2012  . Encounter for well adult exam with abnormal findings 06/11/2011  . Other specified anomalies of genital organs 05/03/2008  . Other corneal disorder 05/02/2008  . Hyperlipidemia 10/10/2006  . Essential hypertension 10/10/2006  . HERNIORRHAPHY, HX OF 10/10/2006    Current Outpatient Medications  Medication Sig Dispense Refill  . albuterol (PROVENTIL HFA;VENTOLIN HFA) 108 (90 Base) MCG/ACT inhaler Inhale 2 puffs into the lungs every 6 (six) hours as needed for wheezing or shortness of breath. 1 Inhaler 5  . aspirin 81 MG EC tablet Take 81 mg by mouth at bedtime. 30 min before niacin    . atorvastatin (LIPITOR) 20 MG tablet Take 1 tablet (20 mg total) by mouth daily. 90 tablet 3  . azithromycin (ZITHROMAX Z-PAK) 250 MG tablet 2 tab by mouth on day 1, then 1 per day 6 tablet 1  . diltiazem (CARTIA XT) 180 MG 24 hr capsule Take 1 capsule (180 mg total) by mouth daily. 90 capsule 3  . fenofibrate 160 MG tablet Take 1 tablet (160 mg total) by mouth daily. 90 tablet 3  . fluticasone (FLOVENT HFA) 110 MCG/ACT inhaler USE 2 PUFFS DAILY 12 g 5  . furosemide (LASIX) 20 MG tablet TAKE 1 TABLET EVERY DAY 90 tablet 3  . losartan (COZAAR) 50 MG tablet Take 1 tablet (50 mg total) by mouth daily. 90 tablet 3  . predniSONE (DELTASONE) 10 MG tablet 3 tabs by mouth per  day for 3 days,2tabs per day for 3 days,1tab per day for 3 days 18 tablet 0  . sildenafil (REVATIO) 20 MG tablet 2-5 tabs daily as needed 90 tablet 11  . sodium chloride (MURO 128) 5 % ophthalmic ointment Place 1 application into both eyes as needed for irritation.    . triamcinolone cream (KENALOG) 0.1 % Apply 1 application topically 2 (two) times daily. 30 g 0  . ondansetron (ZOFRAN) 4 MG tablet Take 1 tablet (4 mg total) by mouth every 8 (eight) hours as needed for nausea or vomiting. 20 tablet 0   No current facility-administered medications for this visit.     Allergies: Patient has no known allergies.  Past Medical History:  Diagnosis Date  . Allergic rhinitis   . Asthma   . HTN (hypertension)   . Hyperlipidemia    high triglycerides  . Sleep apnea    moderate  . Vision blurred    epithelial basement membrane dystrophy    Past Surgical History:  Procedure Laterality Date  . COLONOSCOPY    . dental implants    . HERNIA REPAIR    . POLYPECTOMY    . TONSILLECTOMY      Family History  Problem Relation Age of Onset  . Alzheimer's disease Mother   . Breast cancer Mother   . Mental illness Mother        alzheimers  . COPD  Father   . Asthma Father   . Stroke Father   . Cancer Sister        brain cancer  . Arthritis Sister   . Diabetes Neg Hx   . Prostate cancer Neg Hx   . Colon cancer Neg Hx   . Heart disease Neg Hx   . Hyperlipidemia Neg Hx   . Rectal cancer Neg Hx   . Stomach cancer Neg Hx     Social History   Tobacco Use  . Smoking status: Never Smoker  . Smokeless tobacco: Never Used  Substance Use Topics  . Alcohol use: Yes    Alcohol/week: 0.0 oz    Comment: occasional beer, wine    Subjective:  Started on Saturday night with sudden onset of nausea/ vomiting/ diarrhea; woke up around midnight with vomiting; vomited around 6 times over the night; had some diarrhea yesterday- seems to be better today; no known sick contacts; did not eat out prior to onset  of symptoms;  Able to eat toast, drink coffee this morning; no known fever; no abdominal pain; no coffee grounds emesis; Notes that feeling much better today but just can't shake the nausea;  Objective:  Vitals:   06/15/17 1358  BP: 116/72  Pulse: 84  Temp: 98.4 F (36.9 C)  TempSrc: Oral  SpO2: 97%  Weight: 161 lb 0.6 oz (73 kg)  Height: 5\' 6"  (1.676 m)    General: Well developed, well nourished, in no acute distress  Skin : Warm and dry.  Head: Normocephalic and atraumatic  Lungs: Respirations unlabored; clear to auscultation bilaterally without wheeze, rales, rhonchi  CVS exam: normal rate and regular rhythm.  Abdomen: Soft; nontender; nondistended; normoactive bowel sounds; no masses or hepatosplenomegaly  Neurologic: Alert and oriented; speech intact; face symmetrical; moves all extremities well; CNII-XII intact without focal deficit   Assessment:  1. Viral gastroenteritis     Plan:  Reassurance; BRAT diet discussed; Rx for Zofran 4 mg to use q 6-8 hours prn nausea; follow-up worse, no better.   No follow-ups on file.  No orders of the defined types were placed in this encounter.   Requested Prescriptions   Signed Prescriptions Disp Refills  . ondansetron (ZOFRAN) 4 MG tablet 20 tablet 0    Sig: Take 1 tablet (4 mg total) by mouth every 8 (eight) hours as needed for nausea or vomiting.

## 2017-06-15 NOTE — Patient Instructions (Signed)

## 2017-06-25 DIAGNOSIS — N183 Chronic kidney disease, stage 3 (moderate): Secondary | ICD-10-CM | POA: Diagnosis not present

## 2017-06-25 DIAGNOSIS — E785 Hyperlipidemia, unspecified: Secondary | ICD-10-CM | POA: Diagnosis not present

## 2017-06-25 DIAGNOSIS — I129 Hypertensive chronic kidney disease with stage 1 through stage 4 chronic kidney disease, or unspecified chronic kidney disease: Secondary | ICD-10-CM | POA: Diagnosis not present

## 2017-06-25 DIAGNOSIS — J45909 Unspecified asthma, uncomplicated: Secondary | ICD-10-CM | POA: Diagnosis not present

## 2017-07-14 DIAGNOSIS — N183 Chronic kidney disease, stage 3 (moderate): Secondary | ICD-10-CM | POA: Diagnosis not present

## 2017-07-30 DIAGNOSIS — H04123 Dry eye syndrome of bilateral lacrimal glands: Secondary | ICD-10-CM | POA: Diagnosis not present

## 2017-07-30 DIAGNOSIS — H2513 Age-related nuclear cataract, bilateral: Secondary | ICD-10-CM | POA: Diagnosis not present

## 2017-07-30 DIAGNOSIS — H1859 Other hereditary corneal dystrophies: Secondary | ICD-10-CM | POA: Diagnosis not present

## 2017-07-30 DIAGNOSIS — D3131 Benign neoplasm of right choroid: Secondary | ICD-10-CM | POA: Diagnosis not present

## 2017-09-08 ENCOUNTER — Ambulatory Visit (INDEPENDENT_AMBULATORY_CARE_PROVIDER_SITE_OTHER): Payer: Medicare HMO | Admitting: Internal Medicine

## 2017-09-08 ENCOUNTER — Ambulatory Visit (INDEPENDENT_AMBULATORY_CARE_PROVIDER_SITE_OTHER)
Admission: RE | Admit: 2017-09-08 | Discharge: 2017-09-08 | Disposition: A | Discharge status: Home / Self Care | Payer: Medicare HMO | Source: Ambulatory Visit | Attending: Internal Medicine | Admitting: Internal Medicine

## 2017-09-08 ENCOUNTER — Encounter: Payer: Self-pay | Admitting: Internal Medicine

## 2017-09-08 VITALS — BP 128/82 | HR 90 | Temp 98.4°F | Ht 66.0 in | Wt 161.0 lb

## 2017-09-08 DIAGNOSIS — J4521 Mild intermittent asthma with (acute) exacerbation: Secondary | ICD-10-CM

## 2017-09-08 DIAGNOSIS — R0789 Other chest pain: Secondary | ICD-10-CM | POA: Diagnosis not present

## 2017-09-08 DIAGNOSIS — R0600 Dyspnea, unspecified: Secondary | ICD-10-CM | POA: Diagnosis not present

## 2017-09-08 DIAGNOSIS — J309 Allergic rhinitis, unspecified: Secondary | ICD-10-CM

## 2017-09-08 DIAGNOSIS — J4541 Moderate persistent asthma with (acute) exacerbation: Secondary | ICD-10-CM

## 2017-09-08 MED ORDER — LORATADINE 10 MG PO TABS: 10.0000 mg | ORAL_TABLET | Freq: Every day | ORAL | 3 refills | Status: DC

## 2017-09-08 MED ORDER — PREDNISONE 10 MG PO TABS: ORAL_TABLET | ORAL | 0 refills | Status: DC

## 2017-09-08 MED ORDER — PROMETHAZINE-CODEINE 6.25-10 MG/5ML PO SYRP: 5.0000 mL | ORAL_SOLUTION | ORAL | 0 refills | Status: DC | PRN

## 2017-09-08 MED ORDER — AZITHROMYCIN 250 MG PO TABS: ORAL_TABLET | ORAL | 0 refills | Status: DC

## 2017-09-08 MED ORDER — METHYLPREDNISOLONE ACETATE 80 MG/ML IJ SUSP
80.0000 mg | Freq: Once | INTRAMUSCULAR | Status: AC
  Administered 2017-09-08: 80 mg via INTRAMUSCULAR

## 2017-09-08 NOTE — Assessment & Plan Note (Signed)
Added Claritin Depo-medrol IM

## 2017-09-08 NOTE — Progress Notes (Signed)
Subjective:  Patient ID: Jake Simon, male    DOB: 1945/03/19  Age: 72 y.o. MRN: [id number]  CC: No chief complaint on file.   HPI Jake Simon presents for "asthma acting up" x 10 days - worse. Wheezing in bed. C/o cough. Chest is tight  Outpatient Medications Prior to Visit  Medication Sig Dispense Refill  . albuterol (PROVENTIL HFA;VENTOLIN HFA) 108 (90 Base) MCG/ACT inhaler Inhale 2 puffs into the lungs every 6 (six) hours as needed for wheezing or shortness of breath. 1 Inhaler 5  . aspirin 81 MG EC tablet Take 81 mg by mouth at bedtime. 30 min before niacin    . atorvastatin (LIPITOR) 20 MG tablet Take 1 tablet (20 mg total) by mouth daily. 90 tablet 3  . azithromycin (ZITHROMAX Z-PAK) 250 MG tablet 2 tab by mouth on day 1, then 1 per day 6 tablet 1  . diltiazem (CARTIA XT) 180 MG 24 hr capsule Take 1 capsule (180 mg total) by mouth daily. 90 capsule 3  . fenofibrate 160 MG tablet Take 1 tablet (160 mg total) by mouth daily. 90 tablet 3  . fluticasone (FLOVENT HFA) 110 MCG/ACT inhaler USE 2 PUFFS DAILY 12 g 5  . furosemide (LASIX) 20 MG tablet TAKE 1 TABLET EVERY DAY 90 tablet 3  . ondansetron (ZOFRAN) 4 MG tablet Take 1 tablet (4 mg total) by mouth every 8 (eight) hours as needed for nausea or vomiting. 20 tablet 0  . predniSONE (DELTASONE) 10 MG tablet 3 tabs by mouth per day for 3 days,2tabs per day for 3 days,1tab per day for 3 days 18 tablet 0  . sodium chloride (MURO 128) 5 % ophthalmic ointment Place 1 application into both eyes as needed for irritation.    . triamcinolone cream (KENALOG) 0.1 % Apply 1 application topically 2 (two) times daily. 30 g 0  . losartan (COZAAR) 50 MG tablet Take 1 tablet (50 mg total) by mouth daily. (Patient not taking: Reported on 09/08/2017) 90 tablet 3  . sildenafil (REVATIO) 20 MG tablet 2-5 tabs daily as needed (Patient not taking: Reported on 09/08/2017) 90 tablet 11   No facility-administered medications prior to visit.      ROS: Review of Systems  Constitutional: Positive for fatigue. Negative for appetite change and unexpected weight change.  HENT: Positive for congestion. Negative for nosebleeds, sneezing, sore throat and trouble swallowing.   Eyes: Negative for itching and visual disturbance.  Respiratory: Positive for shortness of breath and wheezing. Negative for cough.   Cardiovascular: Negative for chest pain, palpitations and leg swelling.  Gastrointestinal: Negative for abdominal distention, blood in stool, diarrhea and nausea.  Genitourinary: Negative for frequency and hematuria.  Musculoskeletal: Negative for back pain, gait problem, joint swelling and neck pain.  Skin: Negative for rash.  Neurological: Negative for dizziness, tremors, speech difficulty and weakness.  Psychiatric/Behavioral: Negative for agitation, dysphoric mood and sleep disturbance. The patient is not nervous/anxious.     Objective:  BP 128/82 (BP Location: Left Arm, Patient Position: Sitting, Cuff Size: Normal)   Pulse 90   Temp 98.4 F (36.9 C) (Oral)   Ht 5\' 6"  (1.676 m)   Wt 161 lb (73 kg)   SpO2 98%   BMI 25.99 kg/m   BP Readings from Last 3 Encounters:  09/08/17 128/82  06/15/17 116/72  04/08/17 134/86    Wt Readings from Last 3 Encounters:  09/08/17 161 lb (73 kg)  06/15/17 161 lb 0.6 oz (73 kg)  04/08/17 160 lb (72.6 kg)    Physical Exam  Constitutional: He is oriented to person, place, and time. He appears well-developed and well-nourished. No distress.  HENT:  Head: Normocephalic and atraumatic.  Right Ear: External ear normal.  Left Ear: External ear normal.  Nose: Nose normal.  Mouth/Throat: Oropharynx is clear and moist. No oropharyngeal exudate.  Eyes: Pupils are equal, round, and reactive to light. Conjunctivae and EOM are normal. Right eye exhibits no discharge. Left eye exhibits no discharge. No scleral icterus.  Neck: Normal range of motion. Neck supple. No JVD present. No tracheal  deviation present. No thyromegaly present.  Cardiovascular: Normal rate, regular rhythm, normal heart sounds and intact distal pulses. Exam reveals no gallop and no friction rub.  No murmur heard. Pulmonary/Chest: Breath sounds normal. No stridor. He is in respiratory distress. He has no wheezes. He has no rales. He exhibits no tenderness.  Abdominal: Soft. Bowel sounds are normal. He exhibits no distension and no mass. There is no tenderness. There is no rebound and no guarding.  Musculoskeletal: Normal range of motion. He exhibits no edema or tenderness.  Lymphadenopathy:    He has no cervical adenopathy.  Neurological: He is alert and oriented to person, place, and time. He has normal reflexes. He displays normal reflexes. No cranial nerve deficit. He exhibits normal muscle tone. Coordination normal.  Skin: Skin is warm and dry. No rash noted. He is not diaphoretic. No erythema. No pallor.  Psychiatric: He has a normal mood and affect. His behavior is normal. Judgment and thought content normal.  tight air movement w/rhonchi  Lab Results  Component Value Date   WBC 8.9 02/18/2017   HGB 16.0 02/18/2017   HCT 46.9 02/18/2017   PLT 215.0 02/18/2017   GLUCOSE 90 02/18/2017   CHOL 162 02/18/2017   TRIG 131.0 02/18/2017   HDL 41.40 02/18/2017   LDLDIRECT 149.0 12/11/2015   LDLCALC 94 02/18/2017   ALT 19 02/18/2017   AST 17 02/18/2017   NA 140 02/18/2017   K 4.1 02/18/2017   CL 105 02/18/2017   CREATININE 1.83 (H) 02/18/2017   BUN 23 02/18/2017   CO2 26 02/18/2017   TSH 2.60 02/18/2017   PSA 0.63 02/18/2017   HGBA1C 5.9 02/18/2017    US Renal  Result Date: 01/16/2016 CLINICAL DATA:  Hypertension, chronic kidney disease stage III EXAM: RENAL/URINARY TRACT ULTRASOUND RENAL DUPLEX DOPPLER ULTRASOUND COMPARISON:  02/11/2015 FINDINGS: Right Kidney: Length: 10 cm. Echogenicity within normal limits. No mass or hydronephrosis visualized. Normal antegrade flow signal in the renal vein.  Left Kidney: Length: 10.7 cm. Echogenicity within normal limits. No mass or hydronephrosis visualized. Normal antegrade flow signal in the renal vein. Bladder:  Incompletely distended.  Prostate 4.9 x 4.7 x 5.7 cm. RENAL DUPLEX ULTRASOUND Right Renal Artery Velocities: Origin:  73 cm/sec Mid:  92 cm/sec Hilum:  80 cm/sec Interlobar:  73 cm/sec Arcuate:  19 Cm/sec Left Renal Artery Velocities: Origin:  78 cm/sec Mid:  90 cm/sec Hilum:  89 cm/sec Interlobar:  55 cm/sec Arcuate:  15 cm/sec Aortic Velocity:  96 Cm/sec Right Renal-Aortic Ratios: Origin: 0.8 Mid:  1.0 Hilum: 0.8 Interlobar: 0.8 Arcuate: 0.2 Left Renal-Aortic Ratios: Origin: 0.8 Mid: 0.9 Hilum: 0.9 Interlobar: 0.6 Arcuate: 0.2 IMPRESSION: 1. No ultrasound evidence of hemodynamically significant renal artery stenosis. If there is continued clinical concern, renal MRA (lower radiation risk, can be performed noncontrast in the setting of renal dysfunction) and CTA ( higher spatial resolution) represent more accurate studies, which  are additionally more sensitive to the detection of duplicated renal arteries. 2. Prostatic enlargement Electronically Signed   By: Lucrezia Europe M.D.   On: 01/16/2016 10:34   US Renal Artery Stenosis  Result Date: 01/16/2016 CLINICAL DATA:  Hypertension, chronic kidney disease stage III EXAM: RENAL/URINARY TRACT ULTRASOUND RENAL DUPLEX DOPPLER ULTRASOUND COMPARISON:  02/11/2015 FINDINGS: Right Kidney: Length: 10 cm. Echogenicity within normal limits. No mass or hydronephrosis visualized. Normal antegrade flow signal in the renal vein. Left Kidney: Length: 10.7 cm. Echogenicity within normal limits. No mass or hydronephrosis visualized. Normal antegrade flow signal in the renal vein. Bladder:  Incompletely distended.  Prostate 4.9 x 4.7 x 5.7 cm. RENAL DUPLEX ULTRASOUND Right Renal Artery Velocities: Origin:  73 cm/sec Mid:  92 cm/sec Hilum:  80 cm/sec Interlobar:  73 cm/sec Arcuate:  19 Cm/sec Left Renal Artery Velocities:  Origin:  78 cm/sec Mid:  90 cm/sec Hilum:  89 cm/sec Interlobar:  55 cm/sec Arcuate:  15 cm/sec Aortic Velocity:  96 Cm/sec Right Renal-Aortic Ratios: Origin: 0.8 Mid:  1.0 Hilum: 0.8 Interlobar: 0.8 Arcuate: 0.2 Left Renal-Aortic Ratios: Origin: 0.8 Mid: 0.9 Hilum: 0.9 Interlobar: 0.6 Arcuate: 0.2 IMPRESSION: 1. No ultrasound evidence of hemodynamically significant renal artery stenosis. If there is continued clinical concern, renal MRA (lower radiation risk, can be performed noncontrast in the setting of renal dysfunction) and CTA ( higher spatial resolution) represent more accurate studies, which are additionally more sensitive to the detection of duplicated renal arteries. 2. Prostatic enlargement Electronically Signed   By: Lucrezia Europe M.D.   On: 01/16/2016 10:34    Assessment & Plan:   There are no diagnoses linked to this encounter.   No orders of the defined types were placed in this encounter.    Follow-up: No follow-ups on file.  Walker Kehr, MD

## 2017-09-08 NOTE — Patient Instructions (Signed)
You can use over-the-counter  "cold" medicines  such as  "Afrin" nasal spray for nasal congestion as directed. Use " Delsym" or" Robitussin" cough syrup varietis for cough.  You can use plain "Tylenol" or "Advil" for fever, chills and achyness. Use Halls or Ricola cough drops.     Please, make an appointment if you are not better or if you're worse.  

## 2017-09-08 NOTE — Assessment & Plan Note (Signed)
Due to asthma - tightness CXR Treat asthma

## 2017-09-08 NOTE — Assessment & Plan Note (Signed)
Prednisone 10 mg: take 4 tabs a day x 3 days; then 3 tabs a day x 4 days; then 2 tabs a day x 4 days, then 1 tab a day x 6 days, then stop. Take pc. Cont MDIs CXR Claritin qd

## 2017-10-08 NOTE — Progress Notes (Addendum)
Subjective:   Jake Simon is a 72 y.o. male who presents for Medicare Annual/Subsequent preventive examination.  Review of Systems:  No ROS.  Medicare Wellness Visit. Additional risk factors are reflected in the social history.  Cardiac Risk Factors include: advanced age (>72men, >57 women);dyslipidemia;hypertension;male gender Sleep patterns: feels rested on waking, gets up 1 times nightly to void and sleeps 7-8 hours nightly.    Home Safety/Smoke Alarms: Feels safe in home. Smoke alarms in place.  Living environment; residence and Firearm Safety: 1-story house/ trailer, no firearms. Lives with wife, no needs for DME, good support system Seat Belt Safety/Bike Helmet: Wears seat belt.   PSA-  Lab Results  Component Value Date   PSA 0.63 02/18/2017   PSA 0.71 12/11/2015   PSA 0.60 05/18/2009       Objective:    Vitals: BP (!) 146/86   Pulse 87   Ht 5\' 6"  (1.676 m)   Wt 161 lb (73 kg)   BMI 25.99 kg/m   Body mass index is 25.99 kg/m.  Advanced Directives 10/09/2017 09/30/2016 05/02/2015 02/11/2015 06/19/2014 06/05/2014  Does Patient Have a Medical Advance Directive? Yes Yes Yes No Yes Yes  Type of Paramedic of [place];Living will [place];Living will - - - [place];Living will  Copy of [place] in Chart? Yes No - copy requested Yes - - -    Tobacco Social History   Tobacco Use  Smoking Status Never Smoker  Smokeless Tobacco Never Used     Counseling given: Not Answered   Past Medical History:  Diagnosis Date  . Allergic rhinitis   . Asthma   . HTN (hypertension)   . Hyperlipidemia    high triglycerides  . Sleep apnea    moderate  . Vision blurred    epithelial basement membrane dystrophy   Past Surgical History:  Procedure Laterality Date  . COLONOSCOPY    . dental implants    . HERNIA REPAIR    . POLYPECTOMY    . TONSILLECTOMY     Family History  Problem Relation  Age of Onset  . Alzheimer's disease Mother   . Breast cancer Mother   . Mental illness Mother        alzheimers  . COPD Father   . Asthma Father   . Stroke Father   . Cancer Sister        brain cancer  . Arthritis Sister   . Diabetes Neg Hx   . Prostate cancer Neg Hx   . Colon cancer Neg Hx   . Heart disease Neg Hx   . Hyperlipidemia Neg Hx   . Rectal cancer Neg Hx   . Stomach cancer Neg Hx    Social History   Socioeconomic History  . Marital status: Married    Spouse name: Not on file  . Number of children: 0  . Years of education: 7  . Highest education level: Not on file  Occupational History  . Occupation: Center for [employer]    Comment: retired  Scientific laboratory technician  . Financial resource strain: Not hard at all  . Food insecurity:    Worry: Never true    Inability: Never true  . Transportation needs:    Medical: No    Non-medical: No  Tobacco Use  . Smoking status: Never Smoker  . Smokeless tobacco: Never Used  Substance and Sexual Activity  . Alcohol use: Yes  Alcohol/week: 0.0 standard drinks    Comment: occasional beer, Elliott Quade  . Drug use: No  . Sexual activity: Not Currently  Lifestyle  . Physical activity:    Days per week: 5 days    Minutes per session: 50 min  . Stress: Not at all  Relationships  . Social connections:    Talks on phone: More than three times a week    Gets together: More than three times a week    Attends religious service: More than 4 times per year    Active member of club or organization: Yes    Attends meetings of clubs or organizations: More than 4 times per year    Relationship status: Married  Other Topics Concern  . Not on file  Social History Narrative     [place] [employer] Tech; [place] @ [facility] for [employer].  Now retired Nurse, adult for [employer].  Has pet. Playing contract bridge. ACP - raised the issues and encouraged the appointment of a HCPOA and to consider detailed directive re: care.               Outpatient Encounter Medications as of 10/09/2017  Medication Sig  . albuterol (PROVENTIL HFA;VENTOLIN HFA) 108 (90 Base) MCG/ACT inhaler Inhale 2 puffs into the lungs every 6 (six) hours as needed for wheezing or shortness of breath.  Marland Kitchen aspirin 81 MG EC tablet Take 81 mg by mouth at bedtime. 30 min before niacin  . atorvastatin (LIPITOR) 20 MG tablet Take 1 tablet (20 mg total) by mouth daily.  Marland Kitchen diltiazem (CARTIA XT) 180 MG 24 hr capsule Take 1 capsule (180 mg total) by mouth daily.  . fenofibrate 160 MG tablet Take 1 tablet (160 mg total) by mouth daily.  . fluticasone (FLOVENT HFA) 110 MCG/ACT inhaler USE 2 PUFFS DAILY  . furosemide (LASIX) 20 MG tablet TAKE 1 TABLET EVERY DAY  . sodium chloride (MURO 128) 5 % ophthalmic ointment Place 1 application into both eyes as needed for irritation.  . triamcinolone cream (KENALOG) 0.1 % Apply 1 application topically 2 (two) times daily.  Marland Kitchen azithromycin (ZITHROMAX Z-PAK) 250 MG tablet As directed  . [DISCONTINUED] azithromycin (ZITHROMAX Z-PAK) 250 MG tablet 2 tab by mouth on day 1, then 1 per day  . [DISCONTINUED] loratadine (CLARITIN) 10 MG tablet Take 1 tablet (10 mg total) by mouth daily.  . [DISCONTINUED] ondansetron (ZOFRAN) 4 MG tablet Take 1 tablet (4 mg total) by mouth every 8 (eight) hours as needed for nausea or vomiting.  . [DISCONTINUED] predniSONE (DELTASONE) 10 MG tablet 3 tabs by mouth per day for 3 days,2tabs per day for 3 days,1tab per day for 3 days (Patient not taking: Reported on 10/09/2017)  . [DISCONTINUED] predniSONE (DELTASONE) 10 MG tablet Prednisone 10 mg: take 4 tabs a day x 3 days; then 3 tabs a day x 4 days; then 2 tabs a day x 4 days, then 1 tab a day x 6 days, then stop. Take pc.  . [DISCONTINUED] promethazine-codeine (PHENERGAN WITH CODEINE) 6.25-10 MG/5ML syrup Take 5 mLs by mouth every 4 (four) hours as needed.   No facility-administered encounter medications on file as of 10/09/2017.     Activities of Daily  Living In your present state of health, do you have any difficulty performing the following activities: 10/09/2017  Hearing? N  Vision? N  Difficulty concentrating or making decisions? N  Walking or climbing stairs? N  Dressing or bathing? N  Doing errands, shopping? N  Preparing  Food and eating ? N  Using the Toilet? N  In the past six months, have you accidently leaked urine? N  Do you have problems with loss of bowel control? N  Managing your Medications? N  Managing your Finances? N  Housekeeping or managing your Housekeeping? N  Some recent data might be hidden    Patient Care Team: Biagio Borg, MD as PCP - General (Internal Medicine) Inda Castle, MD (Inactive) (Gastroenterology) Clent Jacks, MD (Ophthalmology) Carolan Clines, MD (Urology) Jarome Matin, MD as Consulting Physician (Dermatology)   Assessment:   This is a routine wellness examination for [place]. Physical assessment deferred to PCP.   Exercise Activities and Dietary recommendations Current Exercise Habits: Home exercise routine, Type of exercise: walking, Time (Minutes): 40, Frequency (Times/Week): 5, Weekly Exercise (Minutes/Week): 200, Intensity: Mild, Exercise limited by: None identified  Diet (meal preparation, eat out, water intake, caffeinated beverages, dairy products, fruits and vegetables): in general, a "healthy" diet  , well balanced. eats a variety of fruits and vegetables daily, limits salt, fat/cholesterol, sugar,carbohydrates,caffeine, drinks 6-8 glasses of water daily.  Goals    . Exercise 150 minutes per week (moderate activity)     HDL 31 and goal at least 40; Will walk an add'l 30 minutes 3 days a week;     Marland Kitchen Maintain current health status     Continue to be healthy as possible by eating healthy, exercising, worshiping God, enjoy life and family    . Patient Stated     I love to travel mainly by taking cruises. Enjoy life continue to play bridge.       Fall Risk Fall Risk   02/17/2017 09/30/2016 12/11/2015 05/02/2015 09/20/2013  Falls in the past year? No No No No No    Depression Screen PHQ 2/9 Scores 02/17/2017 09/30/2016 12/11/2015 05/02/2015  PHQ - 2 Score 0 0 0 0  PHQ- 9 Score 0 - - -    Cognitive Function MMSE - Mini Mental State Exam 05/02/2015  Not completed: (No Data)       Ad8 score reviewed for issues:  Issues making decisions: no  Less interest in hobbies / activities: no  Repeats questions, stories (family complaining): no  Trouble using ordinary gadgets (microwave, computer, phone):no  Forgets the month or year: no  Mismanaging finances: no  Remembering appts: no  Daily problems with thinking and/or memory: no Ad8 score is= 0  Immunization History  Administered Date(s) Administered  . H1N1 02/15/2008  . Influenza Split 12/10/2010, 11/11/2011  . Influenza Whole 01/05/2008, 12/12/2008, 12/11/2009  . Influenza, High Dose Seasonal PF 12/08/2013, 11/22/2015, 11/25/2016  . Influenza,inj,Quad PF,6+ Mos 11/23/2012, 11/16/2014  . Pneumococcal Conjugate-13 09/20/2013  . Pneumococcal Polysaccharide-23 05/22/2009  . Td 05/02/2008  . Zoster 08/20/2009  . Zoster Recombinat (Shingrix) 10/06/2016, 09/14/2017   Screening Tests Health Maintenance  Topic Date Due  . INFLUENZA VACCINE  10/01/2017  . TETANUS/TDAP  05/03/2018  . COLONOSCOPY  06/18/2024  . Hepatitis C Screening  Completed        Plan:     Continue doing brain stimulating activities (puzzles, reading, adult coloring books, staying active) to keep memory sharp.   Continue to eat heart healthy diet (full of fruits, vegetables, whole grains, lean protein, water--limit salt, fat, and sugar intake) and increase physical activity as tolerated.   I have personally reviewed and noted the following in the patient's chart:   . Medical and social history . Use of alcohol, tobacco or illicit drugs  .  Current medications and supplements . Functional ability and  status . Nutritional status . Physical activity . Advanced directives . List of other physicians . Vitals . Screenings to include cognitive, depression, and falls . Referrals and appointments  In addition, I have reviewed and discussed with patient certain preventive protocols, quality metrics, and best practice recommendations. A written personalized care plan for preventive services as well as general preventive health recommendations were provided to patient.     Michiel Cowboy, RN  10/09/2017  Medical screening examination/treatment/procedure(s) were performed by non-physician practitioner and as supervising physician I was immediately available for consultation/collaboration. I agree with above. Cathlean Cower, MD

## 2017-10-09 ENCOUNTER — Ambulatory Visit (INDEPENDENT_AMBULATORY_CARE_PROVIDER_SITE_OTHER): Payer: Medicare HMO | Admitting: *Deleted

## 2017-10-09 VITALS — BP 146/86 | HR 87 | Ht 66.0 in | Wt 161.0 lb

## 2017-10-09 DIAGNOSIS — Z0000 Encounter for general adult medical examination without abnormal findings: Secondary | ICD-10-CM

## 2017-10-09 NOTE — Patient Instructions (Addendum)
Continue doing brain stimulating activities (puzzles, reading, adult coloring books, staying active) to keep memory sharp.   Continue to eat heart healthy diet (full of fruits, vegetables, whole grains, lean protein, water--limit salt, fat, and sugar intake) and increase physical activity as tolerated.   Jake Simon , Thank you for taking time to come for your Medicare Wellness Visit. I appreciate your ongoing commitment to your health goals. Please review the following plan we discussed and let me know if I can assist you in the future.   These are the goals we discussed: Goals    . Exercise 150 minutes per week (moderate activity)     HDL 31 and goal at least 40; Will walk an add'l 30 minutes 3 days a week;     Marland Kitchen Maintain current health status     Continue to be healthy as possible by eating healthy, exercising, worshiping God, enjoy life and family    . Patient Stated     I love to travel mainly by taking cruises. Enjoy life continue to play bridge.       This is a list of the screening recommended for you and due dates:  Health Maintenance  Topic Date Due  . Flu Shot  10/01/2017  . Tetanus Vaccine  05/03/2018  . Colon Cancer Screening  06/18/2024  .  Hepatitis C: One time screening is recommended by Center for Disease Control  (CDC) for  adults born from [birth year] through 1966.   Completed

## 2017-10-13 ENCOUNTER — Other Ambulatory Visit: Payer: Self-pay | Admitting: Internal Medicine

## 2017-12-14 ENCOUNTER — Encounter: Payer: Self-pay | Admitting: Family

## 2017-12-14 ENCOUNTER — Other Ambulatory Visit (INDEPENDENT_AMBULATORY_CARE_PROVIDER_SITE_OTHER): Payer: Medicare HMO

## 2017-12-14 ENCOUNTER — Ambulatory Visit (INDEPENDENT_AMBULATORY_CARE_PROVIDER_SITE_OTHER): Payer: Medicare HMO | Admitting: Family

## 2017-12-14 VITALS — BP 138/82 | HR 100 | Temp 98.4°F | Ht 66.0 in | Wt 162.0 lb

## 2017-12-14 DIAGNOSIS — J4541 Moderate persistent asthma with (acute) exacerbation: Secondary | ICD-10-CM

## 2017-12-14 DIAGNOSIS — R0602 Shortness of breath: Secondary | ICD-10-CM

## 2017-12-14 DIAGNOSIS — J209 Acute bronchitis, unspecified: Secondary | ICD-10-CM | POA: Diagnosis not present

## 2017-12-14 LAB — CBC WITH DIFFERENTIAL/PLATELET
Basophils Absolute: 0.1 10*3/uL (ref 0.0–0.1)
Basophils Relative: 1 % (ref 0.0–3.0)
EOS PCT: 7.5 % — AB (ref 0.0–5.0)
Eosinophils Absolute: 0.8 10*3/uL — ABNORMAL HIGH (ref 0.0–0.7)
HEMATOCRIT: 45.6 % (ref 39.0–52.0)
HEMOGLOBIN: 15.9 g/dL (ref 13.0–17.0)
Lymphocytes Relative: 16 % (ref 12.0–46.0)
Lymphs Abs: 1.8 10*3/uL (ref 0.7–4.0)
MCHC: 34.9 g/dL (ref 30.0–36.0)
MCV: 88.7 fl (ref 78.0–100.0)
MONO ABS: 0.9 10*3/uL (ref 0.1–1.0)
MONOS PCT: 8.3 % (ref 3.0–12.0)
Neutro Abs: 7.4 10*3/uL (ref 1.4–7.7)
Neutrophils Relative %: 67.2 % (ref 43.0–77.0)
Platelets: 245 10*3/uL (ref 150.0–400.0)
RBC: 5.14 Mil/uL (ref 4.22–5.81)
RDW: 13.7 % (ref 11.5–15.5)
WBC: 11 10*3/uL — AB (ref 4.0–10.5)

## 2017-12-14 MED ORDER — METHYLPREDNISOLONE 4 MG PO TBPK: ORAL_TABLET | ORAL | 0 refills | Status: DC

## 2017-12-14 MED ORDER — AZITHROMYCIN 250 MG PO TABS: ORAL_TABLET | ORAL | 0 refills | Status: DC

## 2017-12-14 NOTE — Progress Notes (Signed)
Jake Simon is a 72 y.o. male with the following history as recorded in EpicCare:  Patient Active Problem List   Diagnosis Date Noted  . Dermatitis 04/08/2017  . Acute upper respiratory infection 04/08/2017  . Asthma with exacerbation 01/03/2016  . Acute hearing loss, right 12/15/2015  . Hyperglycemia 12/11/2015  . CKD (chronic kidney disease) stage 3, GFR 30-59 ml/min (HCC) 02/20/2015  . Chest pain 02/11/2015  . Atypical chest pain 02/11/2015  . Epigastric pain   . Allergic rhinitis 10/12/2014  . Hearing loss sensory, bilateral 10/12/2014  . Skin lesion 09/20/2013  . Erectile dysfunction 02/06/2013  . Asthma, chronic 08/04/2012  . Encounter for well adult exam with abnormal findings 06/11/2011  . Other specified anomalies of genital organs 05/03/2008  . Other corneal disorder 05/02/2008  . Hyperlipidemia 10/10/2006  . Essential hypertension 10/10/2006  . HERNIORRHAPHY, HX OF 10/10/2006    Current Outpatient Medications  Medication Sig Dispense Refill  . albuterol (PROVENTIL HFA;VENTOLIN HFA) 108 (90 Base) MCG/ACT inhaler Inhale 2 puffs into the lungs every 6 (six) hours as needed for wheezing or shortness of breath. 1 Inhaler 5  . aspirin 81 MG EC tablet Take 81 mg by mouth at bedtime. 30 min before niacin    . atorvastatin (LIPITOR) 20 MG tablet TAKE 1 TABLET (20 MG TOTAL) BY MOUTH DAILY. 90 tablet 1  . azithromycin (ZITHROMAX) 250 MG tablet 2 tabs po qd x 1 day; 1 tablet per day x 4 days; 6 tablet 0  . diltiazem (CARDIZEM CD) 180 MG 24 hr capsule TAKE 1 CAPSULE (180 MG TOTAL) BY MOUTH DAILY. 90 capsule 1  . fenofibrate 160 MG tablet Take 1 tablet (160 mg total) by mouth daily. 90 tablet 3  . fluticasone (FLOVENT HFA) 110 MCG/ACT inhaler USE 2 PUFFS DAILY 12 g 5  . furosemide (LASIX) 20 MG tablet TAKE 1 TABLET EVERY DAY 90 tablet 1  . methylPREDNISolone (MEDROL DOSEPAK) 4 MG TBPK tablet Taper as directed 21 tablet 0  . sodium chloride (MURO 128) 5 % ophthalmic ointment  Place 1 application into both eyes as needed for irritation.    . triamcinolone cream (KENALOG) 0.1 % Apply 1 application topically 2 (two) times daily. 30 g 0   No current facility-administered medications for this visit.     Allergies: Patient has no known allergies.  Past Medical History:  Diagnosis Date  . Allergic rhinitis   . Asthma   . HTN (hypertension)   . Hyperlipidemia    high triglycerides  . Sleep apnea    moderate  . Vision blurred    epithelial basement membrane dystrophy    Past Surgical History:  Procedure Laterality Date  . COLONOSCOPY    . dental implants    . HERNIA REPAIR    . POLYPECTOMY    . TONSILLECTOMY      Family History  Problem Relation Age of Onset  . Alzheimer's disease Mother   . Breast cancer Mother   . Mental illness Mother        alzheimers  . COPD Father   . Asthma Father   . Stroke Father   . Cancer Sister        brain cancer  . Arthritis Sister   . Diabetes Neg Hx   . Prostate cancer Neg Hx   . Colon cancer Neg Hx   . Heart disease Neg Hx   . Hyperlipidemia Neg Hx   . Rectal cancer Neg Hx   . Stomach cancer  Neg Hx     Social History   Tobacco Use  . Smoking status: Never Smoker  . Smokeless tobacco: Never Used  Substance Use Topics  . Alcohol use: Yes    Alcohol/week: 0.0 standard drinks    Comment: occasional beer, wine    Subjective:  Patient started with cold symptoms x 3 weeks; + productive cough; + wheezing; "just can't get the cough to go away." Has been using the Hycodan to help sleep at night; has needed to use rescue inhaler more frequently in the past few days; no fever; no chest pain/ no sense of air hunger; Symptoms started at end of trans-atlantic cruise; flew home from [place] to [place] prior to onset of symptoms. Has asthma- only taking his Flovent once per day; second episode of asthma exacerbation in 3 months; had CXR in July 2019;       Objective:  Vitals:   12/14/17 1103  BP: 138/82  Pulse: 100   Temp: 98.4 F (36.9 C)  TempSrc: Oral  SpO2: 97%  Weight: 162 lb (73.5 kg)  Height: 5\' 6"  (1.676 m)    General: Well developed, well nourished, in no acute distress  Skin : Warm and dry.  Head: Normocephalic and atraumatic  Eyes: Sclera and conjunctiva clear; pupils round and reactive to light; extraocular movements intact  Ears: External normal; canals clear; tympanic membranes normal  Oropharynx: Pink, supple. No suspicious lesions  Neck: Supple without thyromegaly, adenopathy  Lungs: Respirations unlabored; wheezing in all 4 lobes CVS exam: normal rate and regular rhythm.  Neurologic: Alert and oriented; speech intact; face symmetrical; moves all extremities well; CNII-XII intact without focal deficit   Assessment:   1. Acute bronchitis, unspecified organism   2. Moderate persistent asthma with exacerbation   3. Shortness of breath     Plan:  Update CBC, D-dimer due to travel on airplane prior to onset of symptoms; encouraged patient to start using the Flovent bid for maintenance; for acute symptoms, Rx for Z-pak and Medrol Dose pak; follow-up worse, no better.  Schedule 1 month follow-up with his PCP to evaluate his asthma on Flovent bid. May need to change to Symbicort or Advair for maintenance.  No follow-ups on file.  Orders Placed This Encounter  Procedures  . CBC w/Diff    Standing Status:   Future    Number of Occurrences:   1    Standing Expiration Date:   12/14/2018  . D-Dimer, Quantitative    Standing Status:   Future    Number of Occurrences:   1    Standing Expiration Date:   12/14/2018    Requested Prescriptions   Signed Prescriptions Disp Refills  . azithromycin (ZITHROMAX) 250 MG tablet 6 tablet 0    Sig: 2 tabs po qd x 1 day; 1 tablet per day x 4 days;  . methylPREDNISolone (MEDROL DOSEPAK) 4 MG TBPK tablet 21 tablet 0    Sig: Taper as directed

## 2017-12-15 ENCOUNTER — Other Ambulatory Visit (INDEPENDENT_AMBULATORY_CARE_PROVIDER_SITE_OTHER): Payer: Medicare HMO

## 2017-12-15 ENCOUNTER — Other Ambulatory Visit: Payer: Self-pay | Admitting: Family

## 2017-12-15 ENCOUNTER — Telehealth: Payer: Self-pay | Admitting: Family

## 2017-12-15 ENCOUNTER — Ambulatory Visit (INDEPENDENT_AMBULATORY_CARE_PROVIDER_SITE_OTHER)
Admission: RE | Admit: 2017-12-15 | Discharge: 2017-12-15 | Disposition: A | Discharge status: Home / Self Care | Payer: Medicare HMO | Source: Ambulatory Visit | Attending: Family | Admitting: Family

## 2017-12-15 DIAGNOSIS — R0602 Shortness of breath: Secondary | ICD-10-CM | POA: Diagnosis not present

## 2017-12-15 DIAGNOSIS — R7989 Other specified abnormal findings of blood chemistry: Secondary | ICD-10-CM | POA: Diagnosis not present

## 2017-12-15 LAB — BASIC METABOLIC PANEL
BUN: 34 mg/dL — AB (ref 6–23)
CALCIUM: 9.3 mg/dL (ref 8.4–10.5)
CO2: 27 mEq/L (ref 19–32)
Chloride: 101 mEq/L (ref 96–112)
Creatinine, Ser: 1.86 mg/dL — ABNORMAL HIGH (ref 0.40–1.50)
GFR: 38.05 mL/min — AB (ref >60.00)
GLUCOSE: 213 mg/dL — AB (ref 70–99)
Potassium: 4 mEq/L (ref 3.5–5.1)
Sodium: 137 mEq/L (ref 135–145)

## 2017-12-15 LAB — D-DIMER, QUANTITATIVE: D-Dimer, Quant: 1.68 mcg/mL FEU — ABNORMAL HIGH (ref <0.50)

## 2017-12-15 MED ORDER — IOPAMIDOL (ISOVUE-370) INJECTION 76%
65.0000 mL | Freq: Once | INTRAVENOUS | Status: AC | PRN
  Administered 2017-12-15: 65 mL via INTRAVENOUS

## 2017-12-15 NOTE — Telephone Encounter (Signed)
2nd note for him- his blood sugar was elevated on the labs we checked today; could be due to the oral steroids; make sure he gets Hgba1c done when he sees Dr. Jenny Reichmann at next OV; if he prefers to come before then, can come get at the lab.

## 2017-12-15 NOTE — Telephone Encounter (Signed)
Spoke with patient and info given 

## 2017-12-22 ENCOUNTER — Other Ambulatory Visit: Payer: Self-pay | Admitting: Family

## 2017-12-22 ENCOUNTER — Encounter: Payer: Self-pay | Admitting: Internal Medicine

## 2017-12-22 MED ORDER — FLUTICASONE PROPIONATE HFA 110 MCG/ACT IN AERO: 2.0000 | INHALATION_SPRAY | Freq: Two times a day (BID) | RESPIRATORY_TRACT | 5 refills | Status: DC

## 2017-12-25 ENCOUNTER — Telehealth: Payer: Self-pay

## 2017-12-25 NOTE — Telephone Encounter (Signed)
Copied from [place] [phone]. Topic: General - Other >> Dec 25, 2017 11:40 AM Yvette Rack wrote: Reason for CRM: Heather with [facility] request clarification on the Rx for fluticasone (FLOVENT HFA) 110 MCG/ACT inhaler. Requests call back. Cb# [phone]

## 2017-12-31 ENCOUNTER — Ambulatory Visit (INDEPENDENT_AMBULATORY_CARE_PROVIDER_SITE_OTHER): Payer: Medicare HMO

## 2017-12-31 DIAGNOSIS — Z23 Encounter for immunization: Secondary | ICD-10-CM | POA: Diagnosis not present

## 2018-01-14 ENCOUNTER — Encounter: Payer: Self-pay | Admitting: Internal Medicine

## 2018-01-14 ENCOUNTER — Other Ambulatory Visit (INDEPENDENT_AMBULATORY_CARE_PROVIDER_SITE_OTHER): Payer: Medicare HMO

## 2018-01-14 ENCOUNTER — Ambulatory Visit (INDEPENDENT_AMBULATORY_CARE_PROVIDER_SITE_OTHER): Payer: Medicare HMO | Admitting: Internal Medicine

## 2018-01-14 VITALS — BP 132/84 | HR 95 | Temp 98.7°F | Ht 66.0 in | Wt 167.0 lb

## 2018-01-14 DIAGNOSIS — Z0000 Encounter for general adult medical examination without abnormal findings: Secondary | ICD-10-CM

## 2018-01-14 DIAGNOSIS — R739 Hyperglycemia, unspecified: Secondary | ICD-10-CM | POA: Diagnosis not present

## 2018-01-14 DIAGNOSIS — E785 Hyperlipidemia, unspecified: Secondary | ICD-10-CM

## 2018-01-14 LAB — CBC WITH DIFFERENTIAL/PLATELET
Basophils Absolute: 0.1 10*3/uL (ref 0.0–0.1)
Basophils Relative: 1.3 % (ref 0.0–3.0)
EOS ABS: 0.5 10*3/uL (ref 0.0–0.7)
Eosinophils Relative: 7.1 % — ABNORMAL HIGH (ref 0.0–5.0)
HCT: 43.1 % (ref 39.0–52.0)
HEMOGLOBIN: 15.2 g/dL (ref 13.0–17.0)
LYMPHS ABS: 1.7 10*3/uL (ref 0.7–4.0)
Lymphocytes Relative: 22.7 % (ref 12.0–46.0)
MCHC: 35.3 g/dL (ref 30.0–36.0)
MCV: 89.6 fl (ref 78.0–100.0)
MONO ABS: 0.7 10*3/uL (ref 0.1–1.0)
Monocytes Relative: 9.6 % (ref 3.0–12.0)
NEUTROS PCT: 59.3 % (ref 43.0–77.0)
Neutro Abs: 4.5 10*3/uL (ref 1.4–7.7)
Platelets: 247 10*3/uL (ref 150.0–400.0)
RBC: 4.8 Mil/uL (ref 4.22–5.81)
RDW: 13.7 % (ref 11.5–15.5)
WBC: 7.5 10*3/uL (ref 4.0–10.5)

## 2018-01-14 LAB — BASIC METABOLIC PANEL
BUN: 27 mg/dL — ABNORMAL HIGH (ref 6–23)
CHLORIDE: 103 meq/L (ref 96–112)
CO2: 29 meq/L (ref 19–32)
Calcium: 9.4 mg/dL (ref 8.4–10.5)
Creatinine, Ser: 1.67 mg/dL — ABNORMAL HIGH (ref 0.40–1.50)
GFR: 43.08 mL/min — AB (ref >60.00)
GLUCOSE: 163 mg/dL — AB (ref 70–99)
POTASSIUM: 3.7 meq/L (ref 3.5–5.1)
Sodium: 140 mEq/L (ref 135–145)

## 2018-01-14 LAB — LIPID PANEL
CHOL/HDL RATIO: 4
Cholesterol: 179 mg/dL (ref 0–200)
HDL: 44.1 mg/dL (ref >39.00)
NONHDL: 135.2
Triglycerides: 216 mg/dL — ABNORMAL HIGH (ref 0.0–149.0)
VLDL: 43.2 mg/dL — ABNORMAL HIGH (ref 0.0–40.0)

## 2018-01-14 LAB — URINALYSIS, ROUTINE W REFLEX MICROSCOPIC
BILIRUBIN URINE: NEGATIVE
Hgb urine dipstick: NEGATIVE
KETONES UR: NEGATIVE
Leukocytes, UA: NEGATIVE
NITRITE: NEGATIVE
PH: 5.5 (ref 5.0–8.0)
RBC / HPF: NONE SEEN (ref 0–?)
Specific Gravity, Urine: 1.02 (ref 1.000–1.030)
Total Protein, Urine: NEGATIVE
UROBILINOGEN UA: 0.2 (ref 0.0–1.0)
Urine Glucose: 100 — AB

## 2018-01-14 LAB — HEPATIC FUNCTION PANEL
ALT: 17 U/L (ref 0–53)
AST: 16 U/L (ref 0–37)
Albumin: 4.4 g/dL (ref 3.5–5.2)
Alkaline Phosphatase: 39 U/L (ref 39–117)
BILIRUBIN DIRECT: 0.2 mg/dL (ref 0.0–0.3)
BILIRUBIN TOTAL: 0.8 mg/dL (ref 0.2–1.2)
Total Protein: 6.7 g/dL (ref 6.0–8.3)

## 2018-01-14 LAB — PSA: PSA: 0.58 ng/mL (ref 0.10–4.00)

## 2018-01-14 LAB — HEMOGLOBIN A1C: HEMOGLOBIN A1C: 5.8 % (ref 4.6–6.5)

## 2018-01-14 LAB — LDL CHOLESTEROL, DIRECT: LDL DIRECT: 115 mg/dL

## 2018-01-14 LAB — TSH: TSH: 1.28 u[IU]/mL (ref 0.35–4.50)

## 2018-01-14 MED ORDER — FLUTICASONE PROPIONATE HFA 110 MCG/ACT IN AERO: 2.0000 | INHALATION_SPRAY | Freq: Two times a day (BID) | RESPIRATORY_TRACT | 5 refills | Status: DC

## 2018-01-14 NOTE — Assessment & Plan Note (Signed)

## 2018-01-14 NOTE — Progress Notes (Signed)
Subjective:    Patient ID: Jake Simon, male    DOB: [birth year], 72 y.o.   MRN: [id number]  HPI  Here for wellness and f/u;  Overall doing ok;  Pt denies Chest pain, worsening SOB, DOE, wheezing, orthopnea, PND, worsening LE edema, palpitations, dizziness or syncope.  Pt denies neurological change such as new headache, facial or extremity weakness.  Pt denies polydipsia, polyuria, or low sugar symptoms. Pt states overall good compliance with treatment and medications, good tolerability, and has been trying to follow appropriate diet.  Pt denies worsening depressive symptoms, suicidal ideation or panic. No fever, night sweats, wt loss, loss of appetite, or other constitutional symptoms.  Pt states good ability with ADL's, has low fall risk, home safety reviewed and adequate, no other significant changes in hearing or vision, and only occasionally active with exercise.  Recent asthmatic bronchitis like wheezing is resolved.  No new complaints Wt Readings from Last 3 Encounters:  01/14/18 167 lb (75.8 kg)  12/14/17 162 lb (73.5 kg)  10/09/17 161 lb (73 kg)   Past Medical History:  Diagnosis Date  . Allergic rhinitis   . Asthma   . HTN (hypertension)   . Hyperlipidemia    high triglycerides  . Sleep apnea    moderate  . Vision blurred    epithelial basement membrane dystrophy   Past Surgical History:  Procedure Laterality Date  . COLONOSCOPY    . dental implants    . HERNIA REPAIR    . POLYPECTOMY    . TONSILLECTOMY      reports that he has never smoked. He has never used smokeless tobacco. He reports that he drinks alcohol. He reports that he does not use drugs. family history includes Alzheimer's disease in his mother; Arthritis in his sister; Asthma in his father; Breast cancer in his mother; COPD in his father; Cancer in his sister; Mental illness in his mother; Stroke in his father. No Known Allergies Current Outpatient Medications on File Prior to Visit  Medication Sig  Dispense Refill  . albuterol (PROVENTIL HFA;VENTOLIN HFA) 108 (90 Base) MCG/ACT inhaler Inhale 2 puffs into the lungs every 6 (six) hours as needed for wheezing or shortness of breath. 1 Inhaler 5  . aspirin 81 MG EC tablet Take 81 mg by mouth at bedtime. 30 min before niacin    . atorvastatin (LIPITOR) 20 MG tablet TAKE 1 TABLET (20 MG TOTAL) BY MOUTH DAILY. 90 tablet 1  . diltiazem (CARDIZEM CD) 180 MG 24 hr capsule TAKE 1 CAPSULE (180 MG TOTAL) BY MOUTH DAILY. 90 capsule 1  . fenofibrate 160 MG tablet Take 1 tablet (160 mg total) by mouth daily. 90 tablet 3  . furosemide (LASIX) 20 MG tablet TAKE 1 TABLET EVERY DAY 90 tablet 1  . methylPREDNISolone (MEDROL DOSEPAK) 4 MG TBPK tablet Taper as directed 21 tablet 0  . sodium chloride (MURO 128) 5 % ophthalmic ointment Place 1 application into both eyes as needed for irritation.    . triamcinolone cream (KENALOG) 0.1 % Apply 1 application topically 2 (two) times daily. 30 g 0   No current facility-administered medications on file prior to visit.    Review of Systems Constitutional: Negative for other unusual diaphoresis, sweats, appetite or weight changes HENT: Negative for other worsening hearing loss, ear pain, facial swelling, mouth sores or neck stiffness.   Eyes: Negative for other worsening pain, redness or other visual disturbance.  Respiratory: Negative for other stridor or swelling Cardiovascular: Negative for  other palpitations or other chest pain  Gastrointestinal: Negative for worsening diarrhea or loose stools, blood in stool, distention or other pain Genitourinary: Negative for hematuria, flank pain or other change in urine volume.  Musculoskeletal: Negative for myalgias or other joint swelling.  Skin: Negative for other color change, or other wound or worsening drainage.  Neurological: Negative for other syncope or numbness. Hematological: Negative for other adenopathy or swelling Psychiatric/Behavioral: Negative for  hallucinations, other worsening agitation, SI, self-injury, or new decreased concentration All other system neg per pt    Objective:   Physical Exam BP 132/84   Pulse 95   Temp 98.7 F (37.1 C) (Oral)   Ht 5\' 6"  (1.676 m)   Wt 167 lb (75.8 kg)   SpO2 97%   BMI 26.95 kg/m  VS noted,  Constitutional: Pt is oriented to person, place, and time. Appears well-developed and well-nourished, in no significant distress and comfortable Head: Normocephalic and atraumatic  Eyes: Conjunctivae and EOM are normal. Pupils are equal, round, and reactive to light Right Ear: External ear normal without discharge Left Ear: External ear normal without discharge Nose: Nose without discharge or deformity Mouth/Throat: Oropharynx is without other ulcerations and moist  Neck: Normal range of motion. Neck supple. No JVD present. No tracheal deviation present or significant neck LA or mass Cardiovascular: Normal rate, regular rhythm, normal heart sounds and intact distal pulses.   Pulmonary/Chest: WOB normal and breath sounds without rales or wheezing  Abdominal: Soft. Bowel sounds are normal. NT. No HSM  Musculoskeletal: Normal range of motion. Exhibits no edema Lymphadenopathy: Has no other cervical adenopathy.  Neurological: Pt is alert and oriented to person, place, and time. Pt has normal reflexes. No cranial nerve deficit. Motor grossly intact, Gait intact Skin: Skin is warm and dry. No rash noted or new ulcerations Psychiatric:  Has normal mood and affect. Behavior is normal without agitation No other exam findings Lab Results  Component Value Date   WBC 7.5 01/14/2018   HGB 15.2 01/14/2018   HCT 43.1 01/14/2018   PLT 247.0 01/14/2018   GLUCOSE 163 (H) 01/14/2018   CHOL 179 01/14/2018   TRIG 216.0 (H) 01/14/2018   HDL 44.10 01/14/2018   LDLDIRECT 115.0 01/14/2018   LDLCALC 94 02/18/2017   ALT 17 01/14/2018   AST 16 01/14/2018   NA 140 01/14/2018   K 3.7 01/14/2018   CL 103 01/14/2018    CREATININE 1.67 (H) 01/14/2018   BUN 27 (H) 01/14/2018   CO2 29 01/14/2018   TSH 1.28 01/14/2018   PSA 0.58 01/14/2018   HGBA1C 5.8 01/14/2018      Assessment & Plan:

## 2018-01-14 NOTE — Assessment & Plan Note (Signed)
Goal ldl < 70 , consider increased lipitor

## 2018-01-14 NOTE — Assessment & Plan Note (Signed)
stable overall by history and exam, recent data reviewed with pt, and pt to continue medical treatment as before,  to f/u any worsening symptoms or concerns  

## 2018-01-14 NOTE — Patient Instructions (Signed)
Your Flovent was refilled today  Please continue all other medications as before, and refills have been done if requested.  Please have the pharmacy call with any other refills you may need.  Please continue your efforts at being more active, low cholesterol diet, and weight control.  You are otherwise up to date with prevention measures today.  Please keep your appointments with your specialists as you may have planned  Please go to the LAB in the Basement (turn left off the elevator) for the tests to be done today  You will be contacted by phone if any changes need to be made immediately.  Otherwise, you will receive a letter about your results with an explanation, but please check with MyChart first.  Please remember to sign up for MyChart if you have not done so, as this will be important to you in the future with finding out test results, communicating by private email, and scheduling acute appointments online when needed.  Please return in 1 year for your yearly visit, or sooner if needed, with Lab testing done 3-5 days before

## 2018-01-15 ENCOUNTER — Other Ambulatory Visit: Payer: Self-pay | Admitting: Internal Medicine

## 2018-01-15 ENCOUNTER — Telehealth: Payer: Self-pay

## 2018-01-15 ENCOUNTER — Other Ambulatory Visit: Payer: Self-pay | Admitting: Family

## 2018-01-15 MED ORDER — ATORVASTATIN CALCIUM 40 MG PO TABS: 40.0000 mg | ORAL_TABLET | Freq: Every day | ORAL | 3 refills | Status: DC

## 2018-01-15 NOTE — Telephone Encounter (Signed)
-----   Message from Biagio Borg, MD sent at 01/15/2018 12:13 PM EST ----- Left message on MyChart, pt to cont same tx except  The test results show that your current treatment is OK, except the LDL cholesterol is still mild elevated.  The goal is to be less than 70.  We need to increase the lipitor to 40 mg per dya.  I will send the prescription and you should hear from the office as well.Redmond Baseman to please inform pt, I will do rx

## 2018-01-15 NOTE — Telephone Encounter (Signed)
Pt has viewed results via MyChart  

## 2018-02-07 ENCOUNTER — Other Ambulatory Visit: Payer: Self-pay | Admitting: Internal Medicine

## 2018-02-15 ENCOUNTER — Telehealth: Payer: Self-pay | Admitting: Internal Medicine

## 2018-02-15 NOTE — Telephone Encounter (Signed)
Please schedule appt for pt. Thanks!

## 2018-02-15 NOTE — Telephone Encounter (Signed)
Copied from [place] [phone]. Topic: Quick Communication - See Telephone Encounter >> Feb 15, 2018  8:07 AM [name], NT wrote: CRM for notification. See Telephone encounter for: 02/15/18.  Patient is calling and states he has bronchitis and has had it many times before. Patient is requesting Dr. Jenny Reichmann to send him in a Z pack without coming in and being seen. Informed patient he would probably need a appointment but I would certainly send a message. Please advise.  Jake Simon, Jake Simon [id number] Phone: [phone] Fax: [phone]

## 2018-02-15 NOTE — Telephone Encounter (Signed)
Appointment has been made for tomorrow 12/17 with Dr.John.

## 2018-02-16 ENCOUNTER — Encounter: Payer: Self-pay | Admitting: Internal Medicine

## 2018-02-16 ENCOUNTER — Ambulatory Visit (INDEPENDENT_AMBULATORY_CARE_PROVIDER_SITE_OTHER): Payer: Medicare HMO | Admitting: Internal Medicine

## 2018-02-16 VITALS — BP 144/86 | HR 103 | Temp 98.4°F | Ht 66.0 in | Wt 165.0 lb

## 2018-02-16 DIAGNOSIS — R05 Cough: Secondary | ICD-10-CM | POA: Insufficient documentation

## 2018-02-16 DIAGNOSIS — R739 Hyperglycemia, unspecified: Secondary | ICD-10-CM | POA: Diagnosis not present

## 2018-02-16 DIAGNOSIS — R059 Cough, unspecified: Secondary | ICD-10-CM

## 2018-02-16 DIAGNOSIS — J4521 Mild intermittent asthma with (acute) exacerbation: Secondary | ICD-10-CM | POA: Diagnosis not present

## 2018-02-16 MED ORDER — HYDROCODONE-HOMATROPINE 5-1.5 MG/5ML PO SYRP: 5.0000 mL | ORAL_SOLUTION | Freq: Four times a day (QID) | ORAL | 0 refills | Status: AC | PRN

## 2018-02-16 MED ORDER — AZITHROMYCIN 250 MG PO TABS: ORAL_TABLET | ORAL | 1 refills | Status: DC

## 2018-02-16 NOTE — Assessment & Plan Note (Signed)
Mild to mod, c/w bronchitis vs pna, declines cxr, for antibx course, cough med prn,  to f/u any worsening symptoms or concerns 

## 2018-02-16 NOTE — Patient Instructions (Signed)
Please take all new medication as prescribed - the antibiotic, and cough medicine as needed  Please continue all other medications as before, including the inhaler as needed  Please have the pharmacy call with any other refills you may need.  Please continue your efforts at being more active, low cholesterol diet, and weight control.  Please keep your appointments with your specialists as you may have planned

## 2018-02-16 NOTE — Assessment & Plan Note (Signed)
stable overall by history and exam, recent data reviewed with pt, and pt to continue medical treatment as before,  to f/u any worsening symptoms or concerns  

## 2018-02-16 NOTE — Progress Notes (Signed)
Subjective:    Patient ID: Jake Simon, male    DOB: [birth year], 72 y.o.   MRN: [id number]  HPI Here with acute onset mild to mod 2-3 days ST, HA, general weakness and malaise, with prod cough greenish sputum, but Pt denies chest pain, increased sob or doe, wheezing, orthopnea, PND, increased LE swelling, palpitations, dizziness or syncope.  Pt denies new neurological symptoms such as new headache, or facial or extremity weakness or numbness   Pt denies polydipsia, polyuria.   Pt denies wt loss, night sweats, loss of appetite, or other constitutional symptoms Past Medical History:  Diagnosis Date  . Allergic rhinitis   . Asthma   . HTN (hypertension)   . Hyperlipidemia    high triglycerides  . Sleep apnea    moderate  . Vision blurred    epithelial basement membrane dystrophy   Past Surgical History:  Procedure Laterality Date  . COLONOSCOPY    . dental implants    . HERNIA REPAIR    . POLYPECTOMY    . TONSILLECTOMY      reports that he has never smoked. He has never used smokeless tobacco. He reports current alcohol use. He reports that he does not use drugs. family history includes Alzheimer's disease in his mother; Arthritis in his sister; Asthma in his father; Breast cancer in his mother; COPD in his father; Cancer in his sister; Mental illness in his mother; Stroke in his father. No Known Allergies Current Outpatient Medications on File Prior to Visit  Medication Sig Dispense Refill  . albuterol (PROVENTIL HFA;VENTOLIN HFA) 108 (90 Base) MCG/ACT inhaler Inhale 2 puffs into the lungs every 6 (six) hours as needed for wheezing or shortness of breath. 1 Inhaler 5  . aspirin 81 MG EC tablet Take 81 mg by mouth at bedtime. 30 min before niacin    . atorvastatin (LIPITOR) 40 MG tablet Take 1 tablet (40 mg total) by mouth daily. 90 tablet 3  . diltiazem (CARDIZEM CD) 180 MG 24 hr capsule TAKE 1 CAPSULE (180 MG TOTAL) BY MOUTH DAILY. 90 capsule 1  . fenofibrate 160 MG tablet  Take 1 tablet (160 mg total) by mouth daily. 90 tablet 3  . fluticasone (FLOVENT HFA) 110 MCG/ACT inhaler Inhale 2 puffs into the lungs 2 (two) times daily. USE 2 PUFFS DAILY 24 g 5  . furosemide (LASIX) 20 MG tablet TAKE 1 TABLET EVERY DAY 90 tablet 1  . methylPREDNISolone (MEDROL DOSEPAK) 4 MG TBPK tablet Taper as directed 21 tablet 0  . sodium chloride (MURO 128) 5 % ophthalmic ointment Place 1 application into both eyes as needed for irritation.    . triamcinolone cream (KENALOG) 0.1 % Apply 1 application topically 2 (two) times daily. 30 g 0   No current facility-administered medications on file prior to visit.    Review of Systems  Constitutional: Negative for other unusual diaphoresis or sweats HENT: Negative for ear discharge or swelling Eyes: Negative for other worsening visual disturbances Respiratory: Negative for stridor or other swelling  Gastrointestinal: Negative for worsening distension or other blood Genitourinary: Negative for retention or other urinary change Musculoskeletal: Negative for other MSK pain or swelling Skin: Negative for color change or other new lesions Neurological: Negative for worsening tremors and other numbness  Psychiatric/Behavioral: Negative for worsening agitation or other fatigue All other system neg per pt    Objective:   Physical Exam BP (!) 144/86   Pulse (!) 103   Temp 98.4 F (36.9 C) (  Oral)   Ht 5\' 6"  (1.676 m)   Wt 165 lb (74.8 kg)   SpO2 96%   BMI 26.63 kg/m  VS noted, mild ill Constitutional: Pt appears in NAD HENT: Head: NCAT.  Right Ear: External ear normal.  Left Ear: External ear normal.  Eyes: . Pupils are equal, round, and reactive to light. Conjunctivae and EOM are normal Nose: without d/c or deformity Neck: Neck supple. Gross normal ROM Cardiovascular: Normal rate and regular rhythm.   Pulmonary/Chest: Effort normal and breath sounds decreased without rales or wheezing.  Neurological: Pt is alert. At baseline  orientation, motor grossly intact Skin: Skin is warm. No rashes, other new lesions, no LE edema Psychiatric: Pt behavior is normal without agitation  No other exam findings Lab Results  Component Value Date   WBC 7.5 01/14/2018   HGB 15.2 01/14/2018   HCT 43.1 01/14/2018   PLT 247.0 01/14/2018   GLUCOSE 163 (H) 01/14/2018   CHOL 179 01/14/2018   TRIG 216.0 (H) 01/14/2018   HDL 44.10 01/14/2018   LDLDIRECT 115.0 01/14/2018   LDLCALC 94 02/18/2017   ALT 17 01/14/2018   AST 16 01/14/2018   NA 140 01/14/2018   K 3.7 01/14/2018   CL 103 01/14/2018   CREATININE 1.67 (H) 01/14/2018   BUN 27 (H) 01/14/2018   CO2 29 01/14/2018   TSH 1.28 01/14/2018   PSA 0.58 01/14/2018   HGBA1C 5.8 01/14/2018       Assessment & Plan:

## 2018-03-31 ENCOUNTER — Encounter: Payer: Self-pay | Admitting: Internal Medicine

## 2018-03-31 MED ORDER — ATORVASTATIN CALCIUM 40 MG PO TABS: 40.0000 mg | ORAL_TABLET | Freq: Every day | ORAL | 3 refills | Status: DC

## 2018-04-19 ENCOUNTER — Other Ambulatory Visit: Payer: Self-pay | Admitting: Internal Medicine

## 2018-08-03 DIAGNOSIS — H1859 Other hereditary corneal dystrophies: Secondary | ICD-10-CM | POA: Diagnosis not present

## 2018-08-03 DIAGNOSIS — H2513 Age-related nuclear cataract, bilateral: Secondary | ICD-10-CM | POA: Diagnosis not present

## 2018-08-03 DIAGNOSIS — D3131 Benign neoplasm of right choroid: Secondary | ICD-10-CM | POA: Diagnosis not present

## 2018-08-03 DIAGNOSIS — H04123 Dry eye syndrome of bilateral lacrimal glands: Secondary | ICD-10-CM | POA: Diagnosis not present

## 2018-08-04 ENCOUNTER — Encounter: Payer: Self-pay | Admitting: Internal Medicine

## 2018-08-24 ENCOUNTER — Other Ambulatory Visit (INDEPENDENT_AMBULATORY_CARE_PROVIDER_SITE_OTHER): Payer: Medicare HMO

## 2018-08-24 ENCOUNTER — Other Ambulatory Visit: Payer: Self-pay

## 2018-08-24 ENCOUNTER — Ambulatory Visit (INDEPENDENT_AMBULATORY_CARE_PROVIDER_SITE_OTHER): Payer: Medicare HMO | Admitting: Internal Medicine

## 2018-08-24 ENCOUNTER — Encounter: Payer: Self-pay | Admitting: Internal Medicine

## 2018-08-24 VITALS — BP 126/84 | HR 97 | Temp 98.2°F | Ht 66.0 in | Wt 165.0 lb

## 2018-08-24 DIAGNOSIS — H903 Sensorineural hearing loss, bilateral: Secondary | ICD-10-CM

## 2018-08-24 DIAGNOSIS — Z125 Encounter for screening for malignant neoplasm of prostate: Secondary | ICD-10-CM

## 2018-08-24 DIAGNOSIS — E611 Iron deficiency: Secondary | ICD-10-CM | POA: Diagnosis not present

## 2018-08-24 DIAGNOSIS — G8929 Other chronic pain: Secondary | ICD-10-CM

## 2018-08-24 DIAGNOSIS — R739 Hyperglycemia, unspecified: Secondary | ICD-10-CM

## 2018-08-24 DIAGNOSIS — M25562 Pain in left knee: Secondary | ICD-10-CM | POA: Diagnosis not present

## 2018-08-24 DIAGNOSIS — N183 Chronic kidney disease, stage 3 unspecified: Secondary | ICD-10-CM

## 2018-08-24 DIAGNOSIS — E538 Deficiency of other specified B group vitamins: Secondary | ICD-10-CM

## 2018-08-24 DIAGNOSIS — Z23 Encounter for immunization: Secondary | ICD-10-CM

## 2018-08-24 DIAGNOSIS — E559 Vitamin D deficiency, unspecified: Secondary | ICD-10-CM

## 2018-08-24 DIAGNOSIS — Z0001 Encounter for general adult medical examination with abnormal findings: Secondary | ICD-10-CM | POA: Diagnosis not present

## 2018-08-24 LAB — URINALYSIS, ROUTINE W REFLEX MICROSCOPIC
Bilirubin Urine: NEGATIVE
Hgb urine dipstick: NEGATIVE
Ketones, ur: NEGATIVE
Leukocytes,Ua: NEGATIVE
Nitrite: NEGATIVE
RBC / HPF: NONE SEEN (ref 0–?)
Specific Gravity, Urine: 1.025 (ref 1.000–1.030)
Total Protein, Urine: NEGATIVE
Urine Glucose: NEGATIVE
Urobilinogen, UA: 0.2 (ref 0.0–1.0)
pH: 5.5 (ref 5.0–8.0)

## 2018-08-24 LAB — HEPATIC FUNCTION PANEL
ALT: 17 U/L (ref 0–53)
AST: 15 U/L (ref 0–37)
Albumin: 4.6 g/dL (ref 3.5–5.2)
Alkaline Phosphatase: 40 U/L (ref 39–117)
Bilirubin, Direct: 0.2 mg/dL (ref 0.0–0.3)
Total Bilirubin: 0.9 mg/dL (ref 0.2–1.2)
Total Protein: 6.8 g/dL (ref 6.0–8.3)

## 2018-08-24 LAB — CBC WITH DIFFERENTIAL/PLATELET
Basophils Absolute: 0.1 10*3/uL (ref 0.0–0.1)
Basophils Relative: 1.5 % (ref 0.0–3.0)
Eosinophils Absolute: 0.4 10*3/uL (ref 0.0–0.7)
Eosinophils Relative: 5 % (ref 0.0–5.0)
HCT: 46 % (ref 39.0–52.0)
Hemoglobin: 15.9 g/dL (ref 13.0–17.0)
Lymphocytes Relative: 23.7 % (ref 12.0–46.0)
Lymphs Abs: 2.1 10*3/uL (ref 0.7–4.0)
MCHC: 34.5 g/dL (ref 30.0–36.0)
MCV: 89.4 fl (ref 78.0–100.0)
Monocytes Absolute: 0.9 10*3/uL (ref 0.1–1.0)
Monocytes Relative: 10.4 % (ref 3.0–12.0)
Neutro Abs: 5.2 10*3/uL (ref 1.4–7.7)
Neutrophils Relative %: 59.4 % (ref 43.0–77.0)
Platelets: 205 10*3/uL (ref 150.0–400.0)
RBC: 5.15 Mil/uL (ref 4.22–5.81)
RDW: 13.2 % (ref 11.5–15.5)
WBC: 8.8 10*3/uL (ref 4.0–10.5)

## 2018-08-24 LAB — BASIC METABOLIC PANEL
BUN: 31 mg/dL — ABNORMAL HIGH (ref 6–23)
CO2: 28 mEq/L (ref 19–32)
Calcium: 9.3 mg/dL (ref 8.4–10.5)
Chloride: 102 mEq/L (ref 96–112)
Creatinine, Ser: 1.91 mg/dL — ABNORMAL HIGH (ref 0.40–1.50)
GFR: 34.65 mL/min — ABNORMAL LOW (ref >60.00)
Glucose, Bld: 111 mg/dL — ABNORMAL HIGH (ref 70–99)
Potassium: 3.8 mEq/L (ref 3.5–5.1)
Sodium: 139 mEq/L (ref 135–145)

## 2018-08-24 LAB — LIPID PANEL
Cholesterol: 181 mg/dL (ref 0–200)
HDL: 39.6 mg/dL (ref >39.00)
NonHDL: 141.13
Total CHOL/HDL Ratio: 5
Triglycerides: 238 mg/dL — ABNORMAL HIGH (ref 0.0–149.0)
VLDL: 47.6 mg/dL — ABNORMAL HIGH (ref 0.0–40.0)

## 2018-08-24 LAB — IBC PANEL
Iron: 75 ug/dL (ref 42–165)
Saturation Ratios: 19.6 % — ABNORMAL LOW (ref 20.0–50.0)
Transferrin: 274 mg/dL (ref 212.0–360.0)

## 2018-08-24 LAB — LDL CHOLESTEROL, DIRECT: Direct LDL: 112 mg/dL

## 2018-08-24 LAB — PSA: PSA: 0.62 ng/mL (ref 0.10–4.00)

## 2018-08-24 LAB — VITAMIN B12: Vitamin B-12: 509 pg/mL (ref 211–911)

## 2018-08-24 LAB — TSH: TSH: 1.62 u[IU]/mL (ref 0.35–4.50)

## 2018-08-24 LAB — HEMOGLOBIN A1C: Hgb A1c MFr Bld: 5.9 % (ref 4.6–6.5)

## 2018-08-24 LAB — VITAMIN D 25 HYDROXY (VIT D DEFICIENCY, FRACTURES): VITD: 38.25 ng/mL (ref 30.00–100.00)

## 2018-08-24 NOTE — Patient Instructions (Signed)
You had the Tdap tetanus shot today  Your ears were cleared of wax impactions by irrigation today  Please continue all other medications as before, and refills have been done if requested.  Please have the pharmacy call with any other refills you may need.  Please continue your efforts at being more active, low cholesterol diet, and weight control.  You are otherwise up to date with prevention measures today.  Please keep your appointments with your specialists as you may have planned  Please go to the LAB in the Basement (turn left off the elevator) for the tests to be done today  You will be contacted by phone if any changes need to be made immediately.  Otherwise, you will receive a letter about your results with an explanation, but please check with MyChart first.  Please remember to sign up for MyChart if you have not done so, as this will be important to you in the future with finding out test results, communicating by private email, and scheduling acute appointments online when needed.  Please return in 6 months, or sooner if needed, with Lab testing done 3-5 days before

## 2018-08-24 NOTE — Progress Notes (Signed)
Subjective:    Patient ID: Jake Simon, male    DOB: 10/01/1945, 73 y.o.   MRN: [id number]  HPI Here for wellness and f/u;  Overall doing ok;  Pt denies Chest pain, worsening SOB, DOE, wheezing, orthopnea, PND, worsening LE edema, palpitations, dizziness or syncope.  Pt denies neurological change such as new headache, facial or extremity weakness.  Pt denies polydipsia, polyuria, or low sugar symptoms. Pt states overall good compliance with treatment and medications, good tolerability, and has been trying to follow appropriate diet.  Pt denies worsening depressive symptoms, suicidal ideation or panic. No fever, night sweats, wt loss, loss of appetite, or other constitutional symptoms.  Pt states good ability with ADL's, has low fall risk, home safety reviewed and adequate, no other significant changes in hearing or vision, and only occasionally active with exercise. Also c/o bilat ? Swimmers ears, with fluid, muffled hearing but no fever, pain or d/c.  And fluids seems to "slosh " with turning over in bed, somewhat cyclical, this time lasted 2-3 wks particularly bad last 2-3 days, moderate to severe, intermittent.  Nothing else makes better or worse Also c/o left knee pain sharp, intermittent x 2-3 months, anterior patellar only, only occurs when sitting for more than a minute , with leg flexed to 90 degrees feet on the floor, better to extend the legs, overall feels is mild, no swelling or giveaways. Also c/o bilateral hearing loss for the past wk, ? Wax again, denies ST, fever, HA, sinus pain or ear pain or d/c, nothing makes better or worse, overall moderate Past Medical History:  Diagnosis Date  . Allergic rhinitis   . Asthma   . HTN (hypertension)   . Hyperlipidemia    high triglycerides  . Sleep apnea    moderate  . Vision blurred    epithelial basement membrane dystrophy   Past Surgical History:  Procedure Laterality Date  . COLONOSCOPY    . dental implants    . HERNIA REPAIR     . POLYPECTOMY    . TONSILLECTOMY      reports that he has never smoked. He has never used smokeless tobacco. He reports current alcohol use. He reports that he does not use drugs. family history includes Alzheimer's disease in his mother; Arthritis in his sister; Asthma in his father; Breast cancer in his mother; COPD in his father; Cancer in his sister; Mental illness in his mother; Stroke in his father. No Known Allergies Current Outpatient Medications on File Prior to Visit  Medication Sig Dispense Refill  . albuterol (PROVENTIL HFA;VENTOLIN HFA) 108 (90 Base) MCG/ACT inhaler Inhale 2 puffs into the lungs every 6 (six) hours as needed for wheezing or shortness of breath. 1 Inhaler 5  . aspirin 81 MG EC tablet Take 81 mg by mouth at bedtime. 30 min before niacin    . atorvastatin (LIPITOR) 40 MG tablet Take 1 tablet (40 mg total) by mouth daily. 90 tablet 3  . diltiazem (CARDIZEM CD) 180 MG 24 hr capsule TAKE 1 CAPSULE (180 MG TOTAL) BY MOUTH DAILY. 90 capsule 1  . fenofibrate 160 MG tablet Take 1 tablet (160 mg total) by mouth daily. 90 tablet 3  . fluticasone (FLOVENT HFA) 110 MCG/ACT inhaler Inhale 2 puffs into the lungs 2 (two) times daily. USE 2 PUFFS DAILY 24 g 5  . furosemide (LASIX) 20 MG tablet TAKE 1 TABLET EVERY DAY 90 tablet 1  . sodium chloride (MURO 128) 5 % ophthalmic ointment Place  1 application into both eyes as needed for irritation.     No current facility-administered medications on file prior to visit.    Review of Systems Constitutional: Negative for other unusual diaphoresis, sweats, appetite or weight changes HENT: Negative for other worsening hearing loss, ear pain, facial swelling, mouth sores or neck stiffness.   Eyes: Negative for other worsening pain, redness or other visual disturbance.  Respiratory: Negative for other stridor or swelling Cardiovascular: Negative for other palpitations or other chest pain  Gastrointestinal: Negative for worsening diarrhea or  loose stools, blood in stool, distention or other pain Genitourinary: Negative for hematuria, flank pain or other change in urine volume.  Musculoskeletal: Negative for myalgias or other joint swelling.  Skin: Negative for other color change, or other wound or worsening drainage.  Neurological: Negative for other syncope or numbness. Hematological: Negative for other adenopathy or swelling Psychiatric/Behavioral: Negative for hallucinations, other worsening agitation, SI, self-injury, or new decreased concentration All other system neg per pt    Objective:   Physical Exam BP 126/84   Pulse 97   Temp 98.2 F (36.8 C) (Oral)   Ht 5\' 6"  (1.676 m)   Wt 165 lb (74.8 kg)   SpO2 97%   BMI 26.63 kg/m  VS noted,  Constitutional: Pt is oriented to person, place, and time. Appears well-developed and well-nourished, in no significant distress and comfortable Head: Normocephalic and atraumatic  Eyes: Conjunctivae and EOM are normal. Pupils are equal, round, and reactive to light Right Ear: External ear normal without discharge Left Ear: External ear normal without discharge Bilateral canals with wax impactions resolved with irrigation Nose: Nose without discharge or deformity Mouth/Throat: Oropharynx is without other ulcerations and moist  Neck: Normal range of motion. Neck supple. No JVD present. No tracheal deviation present or significant neck LA or mass Cardiovascular: Normal rate, regular rhythm, normal heart sounds and intact distal pulses.   Pulmonary/Chest: WOB normal and breath sounds without rales or wheezing  Abdominal: Soft. Bowel sounds are normal. NT. No HSM  Musculoskeletal: Normal range of motion. Exhibits no edema Lymphadenopathy: Has no other cervical adenopathy.  Neurological: Pt is alert and oriented to person, place, and time. Pt has normal reflexes. No cranial nerve deficit. Motor grossly intact, Gait intact Skin: Skin is warm and dry. No rash noted or new ulcerations  Psychiatric:  Has normal mood and affect. Behavior is normal without agitation No other exam findings Lab Results  Component Value Date   WBC 8.8 08/24/2018   HGB 15.9 08/24/2018   HCT 46.0 08/24/2018   PLT 205.0 08/24/2018   GLUCOSE 111 (H) 08/24/2018   CHOL 181 08/24/2018   TRIG 238.0 (H) 08/24/2018   HDL 39.60 08/24/2018   LDLDIRECT 112.0 08/24/2018   LDLCALC 94 02/18/2017   ALT 17 08/24/2018   AST 15 08/24/2018   NA 139 08/24/2018   K 3.8 08/24/2018   CL 102 08/24/2018   CREATININE 1.91 (H) 08/24/2018   BUN 31 (H) 08/24/2018   CO2 28 08/24/2018   TSH 1.62 08/24/2018   PSA 0.62 08/24/2018   HGBA1C 5.9 08/24/2018      Assessment & Plan:

## 2018-08-26 ENCOUNTER — Encounter: Payer: Self-pay | Admitting: Internal Medicine

## 2018-08-26 NOTE — Assessment & Plan Note (Signed)
Improved with wax impaction irrigations,  to f/u any worsening symptoms or concerns

## 2018-08-26 NOTE — Assessment & Plan Note (Addendum)
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

## 2018-08-26 NOTE — Assessment & Plan Note (Signed)
Stable per pt after following with Dr Shan Levans,  to f/u any worsening symptoms or concerns

## 2018-08-26 NOTE — Assessment & Plan Note (Signed)
stable overall by history and exam, recent data reviewed with pt, and pt to continue medical treatment as before,  to f/u any worsening symptoms or concerns  

## 2018-10-10 ENCOUNTER — Other Ambulatory Visit: Payer: Self-pay | Admitting: Internal Medicine

## 2018-10-27 ENCOUNTER — Telehealth: Payer: Self-pay | Admitting: Internal Medicine

## 2018-10-27 NOTE — Telephone Encounter (Signed)
Called patient to schedule AWV, no answer. Will try to call patient back at a later time. SF

## 2018-12-04 ENCOUNTER — Ambulatory Visit (INDEPENDENT_AMBULATORY_CARE_PROVIDER_SITE_OTHER): Payer: Medicare HMO

## 2018-12-04 DIAGNOSIS — Z23 Encounter for immunization: Secondary | ICD-10-CM | POA: Diagnosis not present

## 2019-01-17 ENCOUNTER — Encounter: Payer: Medicare HMO | Admitting: Internal Medicine

## 2019-02-23 ENCOUNTER — Ambulatory Visit (INDEPENDENT_AMBULATORY_CARE_PROVIDER_SITE_OTHER): Payer: Medicare HMO | Admitting: Internal Medicine

## 2019-02-23 ENCOUNTER — Other Ambulatory Visit: Payer: Self-pay

## 2019-02-23 ENCOUNTER — Encounter: Payer: Self-pay | Admitting: Internal Medicine

## 2019-02-23 VITALS — BP 160/90 | HR 75 | Temp 98.4°F | Ht 66.0 in | Wt 164.0 lb

## 2019-02-23 DIAGNOSIS — R739 Hyperglycemia, unspecified: Secondary | ICD-10-CM | POA: Diagnosis not present

## 2019-02-23 DIAGNOSIS — L989 Disorder of the skin and subcutaneous tissue, unspecified: Secondary | ICD-10-CM

## 2019-02-23 DIAGNOSIS — N183 Chronic kidney disease, stage 3 unspecified: Secondary | ICD-10-CM

## 2019-02-23 DIAGNOSIS — E611 Iron deficiency: Secondary | ICD-10-CM

## 2019-02-23 DIAGNOSIS — E559 Vitamin D deficiency, unspecified: Secondary | ICD-10-CM | POA: Diagnosis not present

## 2019-02-23 DIAGNOSIS — E538 Deficiency of other specified B group vitamins: Secondary | ICD-10-CM | POA: Diagnosis not present

## 2019-02-23 DIAGNOSIS — E785 Hyperlipidemia, unspecified: Secondary | ICD-10-CM

## 2019-02-23 DIAGNOSIS — H9192 Unspecified hearing loss, left ear: Secondary | ICD-10-CM

## 2019-02-23 DIAGNOSIS — I10 Essential (primary) hypertension: Secondary | ICD-10-CM

## 2019-02-23 DIAGNOSIS — Z0000 Encounter for general adult medical examination without abnormal findings: Secondary | ICD-10-CM

## 2019-02-23 LAB — HEPATIC FUNCTION PANEL
ALT: 21 U/L (ref 0–53)
AST: 19 U/L (ref 0–37)
Albumin: 4.5 g/dL (ref 3.5–5.2)
Alkaline Phosphatase: 45 U/L (ref 39–117)
Bilirubin, Direct: 0.3 mg/dL (ref 0.0–0.3)
Total Bilirubin: 1.4 mg/dL — ABNORMAL HIGH (ref 0.2–1.2)
Total Protein: 7.4 g/dL (ref 6.0–8.3)

## 2019-02-23 LAB — BASIC METABOLIC PANEL
BUN: 26 mg/dL — ABNORMAL HIGH (ref 6–23)
CO2: 26 mEq/L (ref 19–32)
Calcium: 9.5 mg/dL (ref 8.4–10.5)
Chloride: 99 mEq/L (ref 96–112)
Creatinine, Ser: 1.92 mg/dL — ABNORMAL HIGH (ref 0.40–1.50)
GFR: 34.4 mL/min — ABNORMAL LOW (ref >60.00)
Glucose, Bld: 94 mg/dL (ref 70–99)
Potassium: 3.6 mEq/L (ref 3.5–5.1)
Sodium: 135 mEq/L (ref 135–145)

## 2019-02-23 LAB — LIPID PANEL
Cholesterol: 186 mg/dL (ref 0–200)
HDL: 43.2 mg/dL (ref >39.00)
LDL Cholesterol: 110 mg/dL — ABNORMAL HIGH (ref 0–99)
NonHDL: 142.99
Total CHOL/HDL Ratio: 4
Triglycerides: 163 mg/dL — ABNORMAL HIGH (ref 0.0–149.0)
VLDL: 32.6 mg/dL (ref 0.0–40.0)

## 2019-02-23 LAB — HEMOGLOBIN A1C: Hgb A1c MFr Bld: 5.8 % (ref 4.6–6.5)

## 2019-02-23 MED ORDER — ATORVASTATIN CALCIUM 40 MG PO TABS: 40.0000 mg | ORAL_TABLET | Freq: Every day | ORAL | 3 refills | Status: DC

## 2019-02-23 MED ORDER — FENOFIBRATE 160 MG PO TABS: 160.0000 mg | ORAL_TABLET | Freq: Every day | ORAL | 3 refills | Status: DC

## 2019-02-23 MED ORDER — FLOVENT HFA 110 MCG/ACT IN AERO: 2.0000 | INHALATION_SPRAY | Freq: Two times a day (BID) | RESPIRATORY_TRACT | 5 refills | Status: DC

## 2019-02-23 MED ORDER — FUROSEMIDE 20 MG PO TABS: 20.0000 mg | ORAL_TABLET | Freq: Every day | ORAL | 3 refills | Status: DC

## 2019-02-23 MED ORDER — DILTIAZEM HCL ER COATED BEADS 240 MG PO CP24: 240.0000 mg | ORAL_CAPSULE | Freq: Every day | ORAL | 3 refills | Status: DC

## 2019-02-23 NOTE — Patient Instructions (Addendum)
Your left ear was irrigated today  Ok to increase the diltiazem ER to 240 mg per day  Please continue all other medications as before, and refills have been done if requested.  Please have the pharmacy call with any other refills you may need.  Please continue your efforts at being more active, low cholesterol diet, and weight control.  Please keep your appointments with your specialists as you may have planned  You will be contacted regarding the referral for: Dermatology - Dr Ronnald Ramp  Please go to the LAB at the blood drawing area for the tests to be done  You will be contacted by phone if any changes need to be made immediately.  Otherwise, you will receive a letter about your results with an explanation, but please check with MyChart first.  Please remember to sign up for MyChart if you have not done so, as this will be important to you in the future with finding out test results, communicating by private email, and scheduling acute appointments online when needed.  Please return in 6 months, or sooner if needed, with Lab testing done 3-5 days before

## 2019-02-24 ENCOUNTER — Telehealth: Payer: Self-pay | Admitting: Internal Medicine

## 2019-02-24 NOTE — Telephone Encounter (Signed)
[facility] pharmacy calling.  States they need clarification on instructions for fluticasone (FLOVENT HFA) 110 MCG/ACT inhaler.

## 2019-02-24 NOTE — Progress Notes (Signed)
Patient consent obtained. Irrigation with water and peroxide performed. Full view of tympanic membranes after procedure.  Patient tolerated procedure well.   

## 2019-02-28 ENCOUNTER — Other Ambulatory Visit: Payer: Self-pay | Admitting: *Deleted

## 2019-02-28 ENCOUNTER — Encounter: Payer: Self-pay | Admitting: Internal Medicine

## 2019-02-28 MED ORDER — FLOVENT HFA 110 MCG/ACT IN AERO: 2.0000 | INHALATION_SPRAY | Freq: Two times a day (BID) | RESPIRATORY_TRACT | 5 refills | Status: DC

## 2019-02-28 NOTE — Telephone Encounter (Signed)
[address] verified instructions--Rx fluticasone 110 MCG/ACT inhaler--2 puffs into lungs BID.

## 2019-03-04 ENCOUNTER — Encounter: Payer: Self-pay | Admitting: Internal Medicine

## 2019-03-04 NOTE — Progress Notes (Signed)
Subjective:    Patient ID: Jake Simon, male    DOB: Sep 05, 1945, 74 y.o.   MRN: [id number]  HPI  Here to f/u; overall doing ok,  Pt denies chest pain, increasing sob or doe, wheezing, orthopnea, PND, increased LE swelling, palpitations, dizziness or syncope.  Pt denies new neurological symptoms such as new headache, or facial or extremity weakness or numbness.  Pt denies polydipsia, polyuria, or low sugar episode.  Pt states overall good compliance with meds, mostly trying to follow appropriate diet, with wt overall stable,  but little exercise however.  Has 1 wk onset left hearing loss - ? Wax, without pain, fever, swelling or drainage.  Also has 2 mo onset left forehead skin lesion, asks for referral dermatology Dr Ronnald Ramp Past Medical History:  Diagnosis Date  . Allergic rhinitis   . Asthma   . HTN (hypertension)   . Hyperlipidemia    high triglycerides  . Sleep apnea    moderate  . Vision blurred    epithelial basement membrane dystrophy   Past Surgical History:  Procedure Laterality Date  . COLONOSCOPY    . dental implants    . HERNIA REPAIR    . POLYPECTOMY    . TONSILLECTOMY      reports that he has never smoked. He has never used smokeless tobacco. He reports current alcohol use. He reports that he does not use drugs. family history includes Alzheimer's disease in his mother; Arthritis in his sister; Asthma in his father; Breast cancer in his mother; COPD in his father; Cancer in his sister; Mental illness in his mother; Stroke in his father. No Known Allergies Current Outpatient Medications on File Prior to Visit  Medication Sig Dispense Refill  . albuterol (PROVENTIL HFA;VENTOLIN HFA) 108 (90 Base) MCG/ACT inhaler Inhale 2 puffs into the lungs every 6 (six) hours as needed for wheezing or shortness of breath. 1 Inhaler 5  . sodium chloride (MURO 128) 5 % ophthalmic ointment Place 1 application into both eyes as needed for irritation.     No current  facility-administered medications on file prior to visit.   Review of Systems  Constitutional: Negative for other unusual diaphoresis or sweats HENT: Negative for ear discharge or swelling Eyes: Negative for other worsening visual disturbances Respiratory: Negative for stridor or other swelling  Gastrointestinal: Negative for worsening distension or other blood Genitourinary: Negative for retention or other urinary change Musculoskeletal: Negative for other MSK pain or swelling Skin: Negative for color change or other new lesions Neurological: Negative for worsening tremors and other numbness  Psychiatric/Behavioral: Negative for worsening agitation or other fatigue All otherwise neg per pt     Objective:   Physical Exam BP (!) 160/90 (BP Location: Left Arm, Patient Position: Sitting, Cuff Size: Normal)   Pulse 75   Temp 98.4 F (36.9 C) (Oral)   Ht 5\' 6"  (1.676 m)   Wt 164 lb (74.4 kg)   SpO2 97%   BMI 26.47 kg/m  VS noted,  Constitutional: Pt appears in NAD HENT: Head: NCAT.  Right Ear: External ear normal.  Left Ear: External ear normal. Left canal wax impaction resolved with irrigation and hearing improved Eyes: . Pupils are equal, round, and reactive to light. Conjunctivae and EOM are normal Nose: without d/c or deformity Neck: Neck supple. Gross normal ROM Cardiovascular: Normal rate and regular rhythm.   Pulmonary/Chest: Effort normal and breath sounds without rales or wheezing.  Abd:  Soft, NT, ND, + BS, no organomegaly Neurological:  Pt is alert. At baseline orientation, motor grossly intact Skin: Skin is warm. No rashes, other new lesions, no LE edema Psychiatric: Pt behavior is normal without agitation  All otherwise neg per pt  Lab Results  Component Value Date   WBC 8.8 08/24/2018   HGB 15.9 08/24/2018   HCT 46.0 08/24/2018   PLT 205.0 08/24/2018   GLUCOSE 94 02/23/2019   CHOL 186 02/23/2019   TRIG 163.0 (H) 02/23/2019   HDL 43.20 02/23/2019    LDLDIRECT 112.0 08/24/2018   LDLCALC 110 (H) 02/23/2019   ALT 21 02/23/2019   AST 19 02/23/2019   NA 135 02/23/2019   K 3.6 02/23/2019   CL 99 02/23/2019   CREATININE 1.92 (H) 02/23/2019   BUN 26 (H) 02/23/2019   CO2 26 02/23/2019   TSH 1.62 08/24/2018   PSA 0.62 08/24/2018   HGBA1C 5.8 02/23/2019        Assessment & Plan:

## 2019-03-04 NOTE — Assessment & Plan Note (Signed)
stable overall by history and exam, recent data reviewed with pt, and pt to continue medical treatment as before,  to f/u any worsening symptoms or concerns  

## 2019-03-04 NOTE — Assessment & Plan Note (Signed)
Resolved with irrigation,  to f/u any worsening symptoms or concerns 

## 2019-03-04 NOTE — Assessment & Plan Note (Signed)
[facility] referral dermatology

## 2019-03-04 NOTE — Assessment & Plan Note (Addendum)
Mild uncontroleld, ok for increased diltazem ER 240 qd, o/w stable overall by history and exam, recent data reviewed with pt, and pt to continue medical treatment as before,  to f/u any worsening symptoms or concerns

## 2019-03-09 ENCOUNTER — Encounter: Payer: Self-pay | Admitting: Internal Medicine

## 2019-03-09 DIAGNOSIS — D485 Neoplasm of uncertain behavior of skin: Secondary | ICD-10-CM | POA: Diagnosis not present

## 2019-03-09 DIAGNOSIS — C4441 Basal cell carcinoma of skin of scalp and neck: Secondary | ICD-10-CM | POA: Diagnosis not present

## 2019-03-28 DIAGNOSIS — C44319 Basal cell carcinoma of skin of other parts of face: Secondary | ICD-10-CM | POA: Diagnosis not present

## 2019-03-28 DIAGNOSIS — Z85828 Personal history of other malignant neoplasm of skin: Secondary | ICD-10-CM | POA: Diagnosis not present

## 2019-04-10 ENCOUNTER — Ambulatory Visit: Payer: Medicare HMO | Attending: Internal Medicine

## 2019-04-10 DIAGNOSIS — Z23 Encounter for immunization: Secondary | ICD-10-CM | POA: Insufficient documentation

## 2019-04-10 NOTE — Progress Notes (Signed)
   Covid-19 Vaccination Clinic  Name:  DEADRICK VERDUGO    MRN: [id number] DOB: August 14, 1945  04/10/2019  Mr. Tibor was observed post Covid-19 immunization for 15 minutes without incidence. He was provided with Vaccine Information Sheet and instruction to access the V-Safe system.   Mr. Latz was instructed to call 911 with any severe reactions post vaccine: Marland Kitchen Difficulty breathing  . Swelling of your face and throat  . A fast heartbeat  . A bad rash all over your body  . Dizziness and weakness    Immunizations Administered    Name Date Dose VIS Date Route   Pfizer COVID-19 Vaccine 04/10/2019  3:50 PM 0.3 mL 02/11/2019 Intramuscular   Manufacturer: [place]   Lot: [id number]   [place]: [id number]

## 2019-04-26 ENCOUNTER — Ambulatory Visit: Payer: Medicare HMO

## 2019-05-05 ENCOUNTER — Ambulatory Visit: Payer: Medicare HMO | Attending: Internal Medicine

## 2019-05-05 DIAGNOSIS — Z23 Encounter for immunization: Secondary | ICD-10-CM | POA: Insufficient documentation

## 2019-05-05 NOTE — Progress Notes (Signed)
   Covid-19 Vaccination [facility]  Name:  Jake Simon    MRN: [id number] DOB: [birth year]  05/05/2019  [name] was observed post Covid-19 immunization for 15 minutes without incident. He was provided with Vaccine Information Sheet and instruction to access the V-Safe system.   [name] was instructed to call 911 with any severe reactions post vaccine: Marland Kitchen Difficulty breathing  . Swelling of face and throat  . A fast heartbeat  . A bad rash all over body  . Dizziness and weakness   Immunizations Administered    Name Date Dose VIS Date Route   Pfizer COVID-19 Vaccine 05/05/2019 12:53 PM 0.3 mL 02/11/2019 Intramuscular   Manufacturer: [place]   Lot: [id number]   [place]: [id number]

## 2019-08-09 DIAGNOSIS — H43813 Vitreous degeneration, bilateral: Secondary | ICD-10-CM | POA: Diagnosis not present

## 2019-08-09 DIAGNOSIS — H04123 Dry eye syndrome of bilateral lacrimal glands: Secondary | ICD-10-CM | POA: Diagnosis not present

## 2019-08-09 DIAGNOSIS — D3131 Benign neoplasm of right choroid: Secondary | ICD-10-CM | POA: Diagnosis not present

## 2019-08-09 DIAGNOSIS — H2513 Age-related nuclear cataract, bilateral: Secondary | ICD-10-CM | POA: Diagnosis not present

## 2019-08-09 DIAGNOSIS — H18593 Other hereditary corneal dystrophies, bilateral: Secondary | ICD-10-CM | POA: Diagnosis not present

## 2019-08-19 DIAGNOSIS — Z0100 Encounter for examination of eyes and vision without abnormal findings: Secondary | ICD-10-CM | POA: Diagnosis not present

## 2019-08-26 ENCOUNTER — Other Ambulatory Visit: Payer: Self-pay

## 2019-08-26 ENCOUNTER — Ambulatory Visit (INDEPENDENT_AMBULATORY_CARE_PROVIDER_SITE_OTHER): Payer: Medicare HMO | Admitting: Internal Medicine

## 2019-08-26 ENCOUNTER — Ambulatory Visit (INDEPENDENT_AMBULATORY_CARE_PROVIDER_SITE_OTHER): Payer: Medicare HMO

## 2019-08-26 ENCOUNTER — Encounter: Payer: Self-pay | Admitting: Internal Medicine

## 2019-08-26 VITALS — BP 138/80 | HR 76 | Temp 98.2°F | Ht 66.0 in | Wt 166.0 lb

## 2019-08-26 VITALS — BP 138/80 | HR 76 | Temp 98.2°F | Resp 16 | Ht 66.0 in | Wt 166.8 lb

## 2019-08-26 DIAGNOSIS — E538 Deficiency of other specified B group vitamins: Secondary | ICD-10-CM

## 2019-08-26 DIAGNOSIS — E785 Hyperlipidemia, unspecified: Secondary | ICD-10-CM

## 2019-08-26 DIAGNOSIS — Z125 Encounter for screening for malignant neoplasm of prostate: Secondary | ICD-10-CM

## 2019-08-26 DIAGNOSIS — N183 Chronic kidney disease, stage 3 unspecified: Secondary | ICD-10-CM | POA: Diagnosis not present

## 2019-08-26 DIAGNOSIS — R739 Hyperglycemia, unspecified: Secondary | ICD-10-CM

## 2019-08-26 DIAGNOSIS — E611 Iron deficiency: Secondary | ICD-10-CM | POA: Diagnosis not present

## 2019-08-26 DIAGNOSIS — J4521 Mild intermittent asthma with (acute) exacerbation: Secondary | ICD-10-CM

## 2019-08-26 DIAGNOSIS — Z0000 Encounter for general adult medical examination without abnormal findings: Secondary | ICD-10-CM

## 2019-08-26 DIAGNOSIS — I10 Essential (primary) hypertension: Secondary | ICD-10-CM | POA: Diagnosis not present

## 2019-08-26 DIAGNOSIS — E559 Vitamin D deficiency, unspecified: Secondary | ICD-10-CM

## 2019-08-26 DIAGNOSIS — H9191 Unspecified hearing loss, right ear: Secondary | ICD-10-CM

## 2019-08-26 DIAGNOSIS — Z0001 Encounter for general adult medical examination with abnormal findings: Secondary | ICD-10-CM | POA: Diagnosis not present

## 2019-08-26 LAB — CBC WITH DIFFERENTIAL/PLATELET
Basophils Absolute: 0.1 10*3/uL (ref 0.0–0.1)
Basophils Relative: 1.1 % (ref 0.0–3.0)
Eosinophils Absolute: 0.2 10*3/uL (ref 0.0–0.7)
Eosinophils Relative: 2.1 % (ref 0.0–5.0)
HCT: 46 % (ref 39.0–52.0)
Hemoglobin: 16 g/dL (ref 13.0–17.0)
Lymphocytes Relative: 18.9 % (ref 12.0–46.0)
Lymphs Abs: 1.6 10*3/uL (ref 0.7–4.0)
MCHC: 34.8 g/dL (ref 30.0–36.0)
MCV: 88.8 fl (ref 78.0–100.0)
Monocytes Absolute: 0.8 10*3/uL (ref 0.1–1.0)
Monocytes Relative: 9.4 % (ref 3.0–12.0)
Neutro Abs: 6 10*3/uL (ref 1.4–7.7)
Neutrophils Relative %: 68.5 % (ref 43.0–77.0)
Platelets: 182 10*3/uL (ref 150.0–400.0)
RBC: 5.19 Mil/uL (ref 4.22–5.81)
RDW: 13.8 % (ref 11.5–15.5)
WBC: 8.7 10*3/uL (ref 4.0–10.5)

## 2019-08-26 LAB — HEMOGLOBIN A1C: Hgb A1c MFr Bld: 5.9 % (ref 4.6–6.5)

## 2019-08-26 LAB — URINALYSIS, ROUTINE W REFLEX MICROSCOPIC
Bilirubin Urine: NEGATIVE
Hgb urine dipstick: NEGATIVE
Ketones, ur: NEGATIVE
Leukocytes,Ua: NEGATIVE
Nitrite: NEGATIVE
Specific Gravity, Urine: 1.02 (ref 1.000–1.030)
Total Protein, Urine: NEGATIVE
Urine Glucose: NEGATIVE
Urobilinogen, UA: 0.2 (ref 0.0–1.0)
pH: 5.5 (ref 5.0–8.0)

## 2019-08-26 LAB — LIPID PANEL
Cholesterol: 172 mg/dL (ref 0–200)
HDL: 40.8 mg/dL (ref >39.00)
LDL Cholesterol: 100 mg/dL — ABNORMAL HIGH (ref 0–99)
NonHDL: 130.86
Total CHOL/HDL Ratio: 4
Triglycerides: 152 mg/dL — ABNORMAL HIGH (ref 0.0–149.0)
VLDL: 30.4 mg/dL (ref 0.0–40.0)

## 2019-08-26 LAB — BASIC METABOLIC PANEL
BUN: 26 mg/dL — ABNORMAL HIGH (ref 6–23)
CO2: 27 mEq/L (ref 19–32)
Calcium: 9.4 mg/dL (ref 8.4–10.5)
Chloride: 102 mEq/L (ref 96–112)
Creatinine, Ser: 1.81 mg/dL — ABNORMAL HIGH (ref 0.40–1.50)
GFR: 36.77 mL/min — ABNORMAL LOW (ref >60.00)
Glucose, Bld: 94 mg/dL (ref 70–99)
Potassium: 3.8 mEq/L (ref 3.5–5.1)
Sodium: 139 mEq/L (ref 135–145)

## 2019-08-26 LAB — HEPATIC FUNCTION PANEL
ALT: 16 U/L (ref 0–53)
AST: 17 U/L (ref 0–37)
Albumin: 4.7 g/dL (ref 3.5–5.2)
Alkaline Phosphatase: 36 U/L — ABNORMAL LOW (ref 39–117)
Bilirubin, Direct: 0.2 mg/dL (ref 0.0–0.3)
Total Bilirubin: 1.1 mg/dL (ref 0.2–1.2)
Total Protein: 7.2 g/dL (ref 6.0–8.3)

## 2019-08-26 LAB — VITAMIN D 25 HYDROXY (VIT D DEFICIENCY, FRACTURES): VITD: 39.42 ng/mL (ref 30.00–100.00)

## 2019-08-26 LAB — PSA: PSA: 0.67 ng/mL (ref 0.10–4.00)

## 2019-08-26 LAB — IBC PANEL
Iron: 87 ug/dL (ref 42–165)
Saturation Ratios: 21.4 % (ref 20.0–50.0)
Transferrin: 290 mg/dL (ref 212.0–360.0)

## 2019-08-26 LAB — TSH: TSH: 1.64 u[IU]/mL (ref 0.35–4.50)

## 2019-08-26 LAB — VITAMIN B12: Vitamin B-12: 412 pg/mL (ref 211–911)

## 2019-08-26 NOTE — Assessment & Plan Note (Signed)
stable overall by history and exam, recent data reviewed with pt, and pt to continue medical treatment as before,  to f/u any worsening symptoms or concerns  

## 2019-08-26 NOTE — Assessment & Plan Note (Addendum)
Improved after wax impaction irrigation,  to f/u any worsening symptoms or concerns  I spent 31 minutes in preparing to see the patient by review of recent labs, imaging and procedures, obtaining and reviewing separately obtained history, communicating with the patient and family or caregiver, ordering medications, tests or procedures, and documenting clinical information in the EHR including the differential Dx, treatment, and any further evaluation and other management of right hearing loss, htn, ckd, hld, ckd, asthma, hyperglycemia

## 2019-08-26 NOTE — Patient Instructions (Signed)
Jake Simon , Thank you for taking time to come for your Medicare Wellness Visit. I appreciate your ongoing commitment to your health goals. Please review the following plan we discussed and let me know if I can assist you in the future.   Screening recommendations/referrals: Colonoscopy: last done 06/19/2014; due every 10 years; may not repeat due to age  Recommended yearly ophthalmology/optometry visit for glaucoma screening and checkup Recommended yearly dental visit for hygiene and checkup  Vaccinations: Influenza vaccine: 12/04/2018 Pneumococcal vaccine: completed Tdap vaccine: 08/24/2018 Shingles vaccine: completed   Covid-19: completed  Advanced directives: Please bring a copy of your health care power of attorney and living will to the office at your convenience.   Conditions/risks identified: Please continue to do your personal lifestyle choices by: daily care of teeth and gums, regular physical activity (goal should be 5 days a week for 30 minutes), eat a healthy diet, avoid tobacco and drug use, limiting any alcohol intake, taking a low-dose aspirin (if not allergic or have been advised by your provider otherwise) and taking vitamins and minerals as recommended by your provider. Continue doing brain stimulating activities (puzzles, reading, adult coloring books, staying active) to keep memory sharp. Continue to eat heart healthy diet (full of fruits, vegetables, whole grains, lean protein, water--limit salt, fat, and sugar intake) and increase physical activity as tolerated.  Next appointment: Please schedule your next Medicare Wellness Visit with your Nurse Health Advisor in 1 year.  Preventive Care 75 Years and Older, Male Preventive care refers to lifestyle choices and visits with your health care provider that can promote health and wellness. What does preventive care include?  A yearly physical exam. This is also called an annual well check.  Dental exams once or twice a  year.  Routine eye exams. Ask your health care provider how often you should have your eyes checked.  Personal lifestyle choices, including:  Daily care of your teeth and gums.  Regular physical activity.  Eating a healthy diet.  Avoiding tobacco and drug use.  Limiting alcohol use.  Practicing safe sex.  Taking low doses of aspirin every day.  Taking vitamin and mineral supplements as recommended by your health care provider. What happens during an annual well check? The services and screenings done by your health care provider during your annual well check will depend on your age, overall health, lifestyle risk factors, and family history of disease. Counseling  Your health care provider may ask you questions about your:  Alcohol use.  Tobacco use.  Drug use.  Emotional well-being.  Home and relationship well-being.  Sexual activity.  Eating habits.  History of falls.  Memory and ability to understand (cognition).  Work and work Statistician. Screening  You may have the following tests or measurements:  Height, weight, and BMI.  Blood pressure.  Lipid and cholesterol levels. These may be checked every 5 years, or more frequently if you are over 75 years old.  Skin check.  Lung cancer screening. You may have this screening every year starting at age 60 if you have a 30-pack-year history of smoking and currently smoke or have quit within the past 15 years.  Fecal occult blood test (FOBT) of the stool. You may have this test every year starting at age 50.  Flexible sigmoidoscopy or colonoscopy. You may have a sigmoidoscopy every 5 years or a colonoscopy every 10 years starting at age 75.  Prostate cancer screening. Recommendations will vary depending on your family history and other  risks.  Hepatitis C blood test.  Hepatitis B blood test.  Sexually transmitted disease (STD) testing.  Diabetes screening. This is done by checking your blood sugar  (glucose) after you have not eaten for a while (fasting). You may have this done every 1-3 years.  Abdominal aortic aneurysm (AAA) screening. You may need this if you are a current or former smoker.  Osteoporosis. You may be screened starting at age 39 if you are at high risk. Talk with your health care provider about your test results, treatment options, and if necessary, the need for more tests. Vaccines  Your health care provider may recommend certain vaccines, such as:  Influenza vaccine. This is recommended every year.  Tetanus, diphtheria, and acellular pertussis (Tdap, Td) vaccine. You may need a Td booster every 10 years.  Zoster vaccine. You may need this after age 65.  Pneumococcal 13-valent conjugate (PCV13) vaccine. One dose is recommended after age 64.  Pneumococcal polysaccharide (PPSV23) vaccine. One dose is recommended after age 22. Talk to your health care provider about which screenings and vaccines you need and how often you need them. This information is not intended to replace advice given to you by your health care provider. Make sure you discuss any questions you have with your health care provider. Document Released: 03/16/2015 Document Revised: 11/07/2015 Document Reviewed: 12/19/2014 Elsevier Interactive Patient Education  2017 [place] Prevention in the Home Falls can cause injuries. They can happen to people of all ages. There are many things you can do to make your home safe and to help prevent falls. What can I do on the outside of my home?  Regularly fix the edges of walkways and driveways and fix any cracks.  Remove anything that might make you trip as you walk through a door, such as a raised step or threshold.  Trim any bushes or trees on the path to your home.  Use bright outdoor lighting.  Clear any walking paths of anything that might make someone trip, such as rocks or tools.  Regularly check to see if handrails are loose or  broken. Make sure that both sides of any steps have handrails.  Any raised decks and porches should have guardrails on the edges.  Have any leaves, snow, or ice cleared regularly.  Use sand or salt on walking paths during winter.  Clean up any spills in your garage right away. This includes oil or grease spills. What can I do in the bathroom?  Use night lights.  Install grab bars by the toilet and in the tub and shower. Do not use towel bars as grab bars.  Use non-skid mats or decals in the tub or shower.  If you need to sit down in the shower, use a plastic, non-slip stool.  Keep the floor dry. Clean up any water that spills on the floor as soon as it happens.  Remove soap buildup in the tub or shower regularly.  Attach bath mats securely with double-sided non-slip rug tape.  Do not have throw rugs and other things on the floor that can make you trip. What can I do in the bedroom?  Use night lights.  Make sure that you have a light by your bed that is easy to reach.  Do not use any sheets or blankets that are too big for your bed. They should not hang down onto the floor.  Have a firm chair that has side arms. You can use this for support  while you get dressed.  Do not have throw rugs and other things on the floor that can make you trip. What can I do in the kitchen?  Clean up any spills right away.  Avoid walking on wet floors.  Keep items that you use a lot in easy-to-reach places.  If you need to reach something above you, use a strong step stool that has a grab bar.  Keep electrical cords out of the way.  Do not use floor polish or wax that makes floors slippery. If you must use wax, use non-skid floor wax.  Do not have throw rugs and other things on the floor that can make you trip. What can I do with my stairs?  Do not leave any items on the stairs.  Make sure that there are handrails on both sides of the stairs and use them. Fix handrails that are  broken or loose. Make sure that handrails are as long as the stairways.  Check any carpeting to make sure that it is firmly attached to the stairs. Fix any carpet that is loose or worn.  Avoid having throw rugs at the top or bottom of the stairs. If you do have throw rugs, attach them to the floor with carpet tape.  Make sure that you have a light switch at the top of the stairs and the bottom of the stairs. If you do not have them, ask someone to add them for you. What else can I do to help prevent falls?  Wear shoes that:  Do not have high heels.  Have rubber bottoms.  Are comfortable and fit you well.  Are closed at the toe. Do not wear sandals.  If you use a stepladder:  Make sure that it is fully opened. Do not climb a closed stepladder.  Make sure that both sides of the stepladder are locked into place.  Ask someone to hold it for you, if possible.  Clearly mark and make sure that you can see:  Any grab bars or handrails.  First and last steps.  Where the edge of each step is.  Use tools that help you move around (mobility aids) if they are needed. These include:  Canes.  Walkers.  Scooters.  Crutches.  Turn on the lights when you go into a dark area. Replace any light bulbs as soon as they burn out.  Set up your furniture so you have a clear path. Avoid moving your furniture around.  If any of your floors are uneven, fix them.  If there are any pets around you, be aware of where they are.  Review your medicines with your doctor. Some medicines can make you feel dizzy. This can increase your chance of falling. Ask your doctor what other things that you can do to help prevent falls. This information is not intended to replace advice given to you by your health care provider. Make sure you discuss any questions you have with your health care provider. Document Released: 12/14/2008 Document Revised: 07/26/2015 Document Reviewed: 03/24/2014 Elsevier  Interactive Patient Education  2017 [employer].

## 2019-08-26 NOTE — Assessment & Plan Note (Signed)

## 2019-08-26 NOTE — Progress Notes (Signed)
Subjective:    Patient ID: Jake Simon, male    DOB: Jun 25, 1945, 74 y.o.   MRN: [id number]  HPI  Here for wellness and f/u;  Overall doing ok;  Pt denies Chest pain, worsening SOB, DOE, wheezing, orthopnea, PND, worsening LE edema, palpitations, dizziness or syncope.  Pt denies neurological change such as new headache, facial or extremity weakness.  Pt denies polydipsia, polyuria, or low sugar symptoms. Pt states overall good compliance with treatment and medications, good tolerability, and has been trying to follow appropriate diet.  Pt denies worsening depressive symptoms, suicidal ideation or panic. No fever, night sweats, wt loss, loss of appetite, or other constitutional symptoms.  Pt states good ability with ADL's, has low fall risk, home safety reviewed and adequate, no other significant changes in vision, and only occasionally active with exercise.  Has been seen per renal w/ f/u at 1 yr Also with 1 wk worsening right hearing loss without fever, d/c HA or other pain.   Past Medical History:  Diagnosis Date  . Allergic rhinitis   . Asthma   . HTN (hypertension)   . Hyperlipidemia    high triglycerides  . Sleep apnea    moderate  . Vision blurred    epithelial basement membrane dystrophy   Past Surgical History:  Procedure Laterality Date  . COLONOSCOPY    . dental implants    . HERNIA REPAIR    . POLYPECTOMY    . TONSILLECTOMY      reports that he has never smoked. He has never used smokeless tobacco. He reports current alcohol use. He reports that he does not use drugs. family history includes Alzheimer's disease in his mother; Arthritis in his sister; Asthma in his father; Breast cancer in his mother; COPD in his father; Cancer in his sister; Mental illness in his mother; Stroke in his father. No Known Allergies Current Outpatient Medications on File Prior to Visit  Medication Sig Dispense Refill  . albuterol (PROVENTIL HFA;VENTOLIN HFA) 108 (90 Base) MCG/ACT inhaler  Inhale 2 puffs into the lungs every 6 (six) hours as needed for wheezing or shortness of breath. 1 Inhaler 5  . atorvastatin (LIPITOR) 40 MG tablet Take 1 tablet (40 mg total) by mouth daily. 90 tablet 3  . diltiazem (CARDIZEM CD) 240 MG 24 hr capsule Take 1 capsule (240 mg total) by mouth daily. 90 capsule 3  . fenofibrate 160 MG tablet Take 1 tablet (160 mg total) by mouth daily. 90 tablet 3  . fluticasone (FLOVENT HFA) 110 MCG/ACT inhaler Inhale 2 puffs into the lungs 2 (two) times daily. USE 2 PUFFS DAILY 12 g 5  . furosemide (LASIX) 20 MG tablet Take 1 tablet (20 mg total) by mouth daily. 90 tablet 3  . sodium chloride (MURO 128) 5 % ophthalmic ointment Place 1 application into both eyes as needed for irritation.     No current facility-administered medications on file prior to visit.   Review of Systems All otherwise neg per pt    Objective:   Physical Exam BP 138/80 (BP Location: Left Arm, Patient Position: Sitting, Cuff Size: Normal)   Pulse 76   Temp 98.2 F (36.8 C) (Oral)   Ht 5\' 6"  (1.676 m)   Wt 166 lb (75.3 kg)   SpO2 97%   BMI 26.79 kg/m  VS noted,  Constitutional: Pt appears in NAD HENT: Head: NCAT.  Right Ear: External ear normal.  Left Ear: External ear normal.  Eyes: . Pupils are  equal, round, and reactive to light. Conjunctivae and EOM are normal Nose: without d/c or deformity Neck: Neck supple. Gross normal ROM Cardiovascular: Normal rate and regular rhythm.   Pulmonary/Chest: Effort normal and breath sounds without rales or wheezing.  Abd:  Soft, NT, ND, + BS, no organomegaly Neurological: Pt is alert. At baseline orientation, motor grossly intact Skin: Skin is warm. No rashes, other new lesions, no LE edema Psychiatric: Pt behavior is normal without agitation  All otherwise neg per pt Lab Results  Component Value Date   WBC 8.8 08/24/2018   HGB 15.9 08/24/2018   HCT 46.0 08/24/2018   PLT 205.0 08/24/2018   GLUCOSE 94 02/23/2019   CHOL 186  02/23/2019   TRIG 163.0 (H) 02/23/2019   HDL 43.20 02/23/2019   LDLDIRECT 112.0 08/24/2018   LDLCALC 110 (H) 02/23/2019   ALT 21 02/23/2019   AST 19 02/23/2019   NA 135 02/23/2019   K 3.6 02/23/2019   CL 99 02/23/2019   CREATININE 1.92 (H) 02/23/2019   BUN 26 (H) 02/23/2019   CO2 26 02/23/2019   TSH 1.62 08/24/2018   PSA 0.62 08/24/2018   HGBA1C 5.8 02/23/2019      Assessment & Plan:

## 2019-08-26 NOTE — Progress Notes (Signed)
Patient consent obtained. Irrigation with water and peroxide performed. Full view of right tympanic membranes after procedure.  Patient tolerated procedure well.   

## 2019-08-26 NOTE — Progress Notes (Signed)
Subjective:   Jake Simon is a 74 y.o. male who presents for Medicare Annual/Subsequent preventive examination.  Review of Systems    No ROS. Medicare Wellness Visit. Additional risk factors are reflected in social history. Cardiac Risk Factors include: advanced age (>35men, >58 women);dyslipidemia;hypertension;male gender;family history of premature cardiovascular disease     Objective:    Today's Vitals   08/26/19 1012  BP: 138/80  Pulse: 76  Resp: 16  Temp: 98.2 F (36.8 C)  SpO2: 97%  Weight: 166 lb 12.8 oz (75.7 kg)  Height: 5\' 6"  (1.676 m)  PainSc: 0-No pain   Body mass index is 26.92 kg/m.  Advanced Directives 08/26/2019 10/09/2017 09/30/2016 05/02/2015 02/11/2015 06/19/2014 06/05/2014  Does Patient Have a Medical Advance Directive? Yes Yes Yes Yes No Yes Yes  Type of Advance Directive - [place];Living will [place];Living will - - - [place];Living will  Does patient want to make changes to medical advance directive? No - Patient declined - - - - - -  Copy of [place] in Chart? - Yes No - copy requested Yes - - -    Current Medications (verified) Outpatient Encounter Medications as of 08/26/2019  Medication Sig  . albuterol (PROVENTIL HFA;VENTOLIN HFA) 108 (90 Base) MCG/ACT inhaler Inhale 2 puffs into the lungs every 6 (six) hours as needed for wheezing or shortness of breath.  Marland Kitchen atorvastatin (LIPITOR) 40 MG tablet Take 1 tablet (40 mg total) by mouth daily.  Marland Kitchen diltiazem (CARDIZEM CD) 240 MG 24 hr capsule Take 1 capsule (240 mg total) by mouth daily.  . fenofibrate 160 MG tablet Take 1 tablet (160 mg total) by mouth daily.  . fluticasone (FLOVENT HFA) 110 MCG/ACT inhaler Inhale 2 puffs into the lungs 2 (two) times daily. USE 2 PUFFS DAILY  . furosemide (LASIX) 20 MG tablet Take 1 tablet (20 mg total) by mouth daily.  . sodium chloride (MURO 128) 5 % ophthalmic ointment Place 1  application into both eyes as needed for irritation.   No facility-administered encounter medications on file as of 08/26/2019.    Allergies (verified) Patient has no known allergies.   History: Past Medical History:  Diagnosis Date  . Allergic rhinitis   . Asthma   . HTN (hypertension)   . Hyperlipidemia    high triglycerides  . Sleep apnea    moderate  . Vision blurred    epithelial basement membrane dystrophy   Past Surgical History:  Procedure Laterality Date  . COLONOSCOPY    . dental implants    . HERNIA REPAIR    . POLYPECTOMY    . TONSILLECTOMY     Family History  Problem Relation Age of Onset  . Alzheimer's disease Mother   . Breast cancer Mother   . Mental illness Mother        alzheimers  . COPD Father   . Asthma Father   . Stroke Father   . Cancer Sister        brain cancer  . Arthritis Sister   . Diabetes Neg Hx   . Prostate cancer Neg Hx   . Colon cancer Neg Hx   . Heart disease Neg Hx   . Hyperlipidemia Neg Hx   . Rectal cancer Neg Hx   . Stomach cancer Neg Hx    Social History   Socioeconomic History  . Marital status: Married    Spouse name: Not on file  .  Number of children: 0  . Years of education: 59  . Highest education level: Not on file  Occupational History  . Occupation: Center for [employer]    Comment: retired  Tobacco Use  . Smoking status: Never Smoker  . Smokeless tobacco: Never Used  Vaping Use  . Vaping Use: Never used  Substance and Sexual Activity  . Alcohol use: Yes    Alcohol/week: 0.0 standard drinks    Comment: occasional beer, wine  . Drug use: No  . Sexual activity: Not Currently  Other Topics Concern  . Not on file  Social History Narrative     [place] [employer]; [place] @ [facility] for [employer].  Now retired Nurse, adult for [employer].  Has pet. Playing contract bridge. ACP - raised the issues and encouraged the appointment of a HCPOA and to consider detailed directive re: care.              Social Determinants of Health   Financial Resource Strain:   . Difficulty of Paying Living Expenses:   Food Insecurity:   . Worried About Charity fundraiser in the Last Year:   . Arboriculturist in the Last Year:   Transportation Needs:   . Film/video editor (Medical):   Marland Kitchen Lack of Transportation (Non-Medical):   Physical Activity:   . Days of Exercise per Week:   . Minutes of Exercise per Session:   Stress:   . Feeling of Stress :   Social Connections:   . Frequency of Communication with Friends and Family:   . Frequency of Social Gatherings with Friends and Family:   . Attends Religious Services:   . Active Member of Clubs or Organizations:   . Attends Archivist Meetings:   Marland Kitchen Marital Status:     Tobacco Counseling Counseling given: No   Clinical Intake:  Pre-visit preparation completed: Yes  Pain : No/denies pain Pain Score: 0-No pain     BMI - recorded: 26.92 Nutritional Status: BMI 25 -29 Overweight Nutritional Risks: None Diabetes: No  How often do you need to have someone help you when you read instructions, pamphlets, or other written materials from your doctor or pharmacy?: 1 - Never What is the last grade level you completed in school?: Graduate School  Diabetic? no  Interpreter Needed?: No  Information entered by :: Lisette Abu, LPN   Activities of Daily Living In your present state of health, do you have any difficulty performing the following activities: 08/26/2019  Hearing? N  Vision? N  Difficulty concentrating or making decisions? N  Walking or climbing stairs? N  Dressing or bathing? N  Doing errands, shopping? N  Preparing Food and eating ? N  Using the Toilet? N  In the past six months, have you accidently leaked urine? N  Do you have problems with loss of bowel control? N  Managing your Medications? N  Managing your Finances? N  Housekeeping or managing your Housekeeping? N  Some recent data might be hidden      Patient Care Team: Biagio Borg, MD as PCP - General (Internal Medicine) Inda Castle, MD (Inactive) (Gastroenterology) Clent Jacks, MD (Ophthalmology) Carolan Clines, MD (Inactive) (Urology) Jarome Matin, MD as Consulting Physician (Dermatology)  Indicate any recent Medical Services you may have received from other than [facility] providers in the past year (date may be approximate).     Assessment:   This is a routine wellness examination for Jake Simon.  Hearing/Vision  screen No exam data present  Dietary issues and exercise activities discussed: Current Exercise Habits: Home exercise routine (walks dog every other day), Type of exercise: walking, Time (Minutes): 45, Frequency (Times/Week): 5, Weekly Exercise (Minutes/Week): 225, Intensity: Moderate, Exercise limited by: None identified  Goals    .  Client understands the importance of follow-up with providers by attending scheduled visits (pt-stated)      Stay healthy, maintain weight and feel good.    .  Exercise 150 minutes per week (moderate activity)      HDL 31 and goal at least 40; Will walk an add'l 30 minutes 3 days a week;     Marland Kitchen  Maintain current health status      Continue to be healthy as possible by eating healthy, exercising, worshiping God, enjoy life and family    .  Patient Stated      I love to travel mainly by taking cruises. Enjoy life continue to play bridge.      Depression Screen PHQ 2/9 Scores 08/26/2019 02/23/2019 01/14/2018 10/09/2017 02/17/2017 09/30/2016 12/11/2015  PHQ - 2 Score 0 0 0 0 0 0 0  PHQ- 9 Score - - - - 0 - -    Fall Risk Fall Risk  08/26/2019 02/23/2019 01/14/2018 10/09/2017 02/17/2017  Falls in the past year? 0 0 0 No No  Number falls in past yr: 0 0 - - -  Injury with Fall? 0 0 - - -  Risk for fall due to : No Fall Risks - - - -  Follow up Falls evaluation completed - - - -    Any stairs in or around the home? Yes  If so, are there any without handrails? No  Home free of  loose throw rugs in walkways, pet beds, electrical cords, etc? Yes  Adequate lighting in your home to reduce risk of falls? Yes   ASSISTIVE DEVICES UTILIZED TO PREVENT FALLS:  Life alert? No  Use of a cane, walker or w/c? No  Grab bars in the bathroom? Yes  Shower chair or bench in shower? No  Elevated toilet seat or a handicapped toilet? No   TIMED UP AND GO:  Was the test performed? No .  Length of time to ambulate 10 feet: 0 sec.   Gait steady and fast without use of assistive device  Cognitive Function: MMSE - Mini Mental State Exam 05/02/2015  Not completed: (No Data)     6CIT Screen 08/26/2019  What Year? 0 points  What month? 0 points  What time? 0 points  Count back from 20 0 points  Months in reverse 0 points  Repeat phrase 0 points  Total Score 0    Immunizations Immunization History  Administered Date(s) Administered  . Fluad Quad(high Dose 65+) 12/04/2018  . H1N1 02/15/2008  . Influenza Split 12/10/2010, 11/11/2011  . Influenza Whole 01/05/2008, 12/12/2008, 12/11/2009  . Influenza, High Dose Seasonal PF 12/08/2013, 11/22/2015, 11/25/2016, 12/31/2017  . Influenza,inj,Quad PF,6+ Mos 11/23/2012, 11/16/2014  . PFIZER SARS-COV-2 Vaccination 04/10/2019, 05/05/2019  . Pneumococcal Conjugate-13 09/20/2013  . Pneumococcal Polysaccharide-23 05/22/2009  . Td 05/02/2008  . Tdap 08/24/2018  . Zoster 08/20/2009  . Zoster Recombinat (Shingrix) 10/06/2016, 09/10/2017    TDAP status: Up to date Flu Vaccine status: Up to date Pneumococcal vaccine status: Up to date Covid-19 vaccine status: Completed vaccines  Qualifies for Shingles Vaccine? Yes   Zostavax completed Yes   Shingrix Completed?: Yes  Screening Tests Health Maintenance  Topic Date Due  .  INFLUENZA VACCINE  10/02/2019  . COLONOSCOPY  06/18/2024  . TETANUS/TDAP  08/23/2028  . COVID-19 Vaccine  Completed  . Hepatitis C Screening  Completed    Health Maintenance  There are no preventive care  reminders to display for this patient.  Colorectal cancer screening: Completed 06/19/2014. Repeat every 10 years  Lung Cancer Screening: (Low Dose CT Chest recommended if Age 31-80 years, 30 pack-year currently smoking OR have quit w/in 15years.) does not qualify.   Lung Cancer Screening Referral: no  Additional Screening:  Hepatitis C Screening: does not qualify; Completed: yes  Vision Screening: Recommended annual ophthalmology exams for early detection of glaucoma and other disorders of the eye. Is the patient up to date with their annual eye exam?  Yes  Who is the provider or what is the name of the office in which the patient attends annual eye exams? Clent Jacks, MD If pt is not established with a provider, would they like to be referred to a provider to establish care? No .   Dental Screening: Recommended annual dental exams for proper oral hygiene  Community Resource Referral / Chronic Care Management: CRR required this visit?  No   CCM required this visit?  No      Plan:     I have personally reviewed and noted the following in the patient's chart:   . Medical and social history . Use of alcohol, tobacco or illicit drugs  . Current medications and supplements . Functional ability and status . Nutritional status . Physical activity . Advanced directives . List of other physicians . Hospitalizations, surgeries, and ER visits in previous 12 months . Vitals . Screenings to include cognitive, depression, and falls . Referrals and appointments  In addition, I have reviewed and discussed with patient certain preventive protocols, quality metrics, and best practice recommendations. A written personalized care plan for preventive services as well as general preventive health recommendations were provided to patient.     Sheral Flow, LPN   0/12/710   Nurse Notes: Patient is cogitatively intact.

## 2019-08-26 NOTE — Patient Instructions (Signed)
Your right ear was cleared of wax today  Please continue all other medications as before, and refills have been done if requested.  Please have the pharmacy call with any other refills you may need.  Please continue your efforts at being more active, low cholesterol diet, and weight control.  You are otherwise up to date with prevention measures today.  Please keep your appointments with your specialists as you may have planned  Please go to the LAB at the blood drawing area for the tests to be done  You will be contacted by phone if any changes need to be made immediately.  Otherwise, you will receive a letter about your results with an explanation, but please check with MyChart first.  Please remember to sign up for MyChart if you have not done so, as this will be important to you in the future with finding out test results, communicating by private email, and scheduling acute appointments online when needed.  Please make an Appointment to return in 6 months, or sooner if needed

## 2019-10-21 ENCOUNTER — Encounter: Payer: Self-pay | Admitting: Internal Medicine

## 2019-12-29 ENCOUNTER — Ambulatory Visit (INDEPENDENT_AMBULATORY_CARE_PROVIDER_SITE_OTHER): Payer: Medicare HMO

## 2019-12-29 ENCOUNTER — Other Ambulatory Visit: Payer: Self-pay

## 2019-12-29 DIAGNOSIS — Z23 Encounter for immunization: Secondary | ICD-10-CM | POA: Diagnosis not present

## 2020-01-16 ENCOUNTER — Other Ambulatory Visit (HOSPITAL_BASED_OUTPATIENT_CLINIC_OR_DEPARTMENT_OTHER): Payer: Self-pay | Admitting: Internal Medicine

## 2020-01-16 ENCOUNTER — Ambulatory Visit: Payer: Medicare HMO | Attending: Internal Medicine

## 2020-01-16 DIAGNOSIS — Z23 Encounter for immunization: Secondary | ICD-10-CM

## 2020-01-16 MED FILL — PFIZER-BIONTECH COVID-19 VA: 30 | 1 days supply | Qty: 0 | Fill #0

## 2020-01-16 NOTE — Progress Notes (Signed)
   Covid-19 Vaccination Clinic  Name:  Jake Simon    MRN: [id number] DOB: 07-Nov-1945  01/16/2020  Jake Simon was observed post Covid-19 immunization for 15 minutes without incident. He was provided with Vaccine Information Sheet and instruction to access the V-Safe system.   Jake Simon was instructed to call 911 with any severe reactions post vaccine: Marland Kitchen Difficulty breathing  . Swelling of face and throat  . A fast heartbeat  . A bad rash all over body  . Dizziness and weakness   Immunizations Administered    Name Date Dose VIS Date Route   Pfizer COVID-19 Vaccine 01/16/2020 11:31 AM 0.3 mL 12/21/2019 Intramuscular   Manufacturer: [place]   Lot: [id number]   NDC: [address]-7

## 2020-02-01 ENCOUNTER — Other Ambulatory Visit: Payer: Self-pay | Admitting: Internal Medicine

## 2020-02-01 NOTE — Telephone Encounter (Signed)
Please refill as per office routine med refill policy (all routine meds refilled for 3 mo or monthly per pt preference up to one year from last visit, then month to month grace period for 3 mo, then further med refills will have to be denied)  

## 2020-02-27 ENCOUNTER — Ambulatory Visit: Payer: Medicare HMO | Admitting: Internal Medicine

## 2020-03-05 ENCOUNTER — Ambulatory Visit: Payer: Medicare HMO | Admitting: Internal Medicine

## 2020-03-06 ENCOUNTER — Ambulatory Visit (INDEPENDENT_AMBULATORY_CARE_PROVIDER_SITE_OTHER): Payer: Medicare HMO | Admitting: Internal Medicine

## 2020-03-06 ENCOUNTER — Encounter: Payer: Self-pay | Admitting: Internal Medicine

## 2020-03-06 ENCOUNTER — Other Ambulatory Visit: Payer: Self-pay

## 2020-03-06 VITALS — BP 160/90 | HR 85 | Temp 99.0°F | Ht 66.0 in | Wt 165.0 lb

## 2020-03-06 DIAGNOSIS — E559 Vitamin D deficiency, unspecified: Secondary | ICD-10-CM

## 2020-03-06 DIAGNOSIS — R739 Hyperglycemia, unspecified: Secondary | ICD-10-CM

## 2020-03-06 DIAGNOSIS — I10 Essential (primary) hypertension: Secondary | ICD-10-CM

## 2020-03-06 DIAGNOSIS — E538 Deficiency of other specified B group vitamins: Secondary | ICD-10-CM | POA: Diagnosis not present

## 2020-03-06 DIAGNOSIS — N183 Chronic kidney disease, stage 3 unspecified: Secondary | ICD-10-CM | POA: Diagnosis not present

## 2020-03-06 DIAGNOSIS — Z0000 Encounter for general adult medical examination without abnormal findings: Secondary | ICD-10-CM | POA: Diagnosis not present

## 2020-03-06 DIAGNOSIS — E7849 Other hyperlipidemia: Secondary | ICD-10-CM

## 2020-03-06 DIAGNOSIS — Z0001 Encounter for general adult medical examination with abnormal findings: Secondary | ICD-10-CM

## 2020-03-06 LAB — CBC WITH DIFFERENTIAL/PLATELET
Basophils Absolute: 0.1 10*3/uL (ref 0.0–0.1)
Basophils Relative: 1 % (ref 0.0–3.0)
Eosinophils Absolute: 0.3 10*3/uL (ref 0.0–0.7)
Eosinophils Relative: 3.2 % (ref 0.0–5.0)
HCT: 46.3 % (ref 39.0–52.0)
Hemoglobin: 16.3 g/dL (ref 13.0–17.0)
Lymphocytes Relative: 18.5 % (ref 12.0–46.0)
Lymphs Abs: 1.6 10*3/uL (ref 0.7–4.0)
MCHC: 35.2 g/dL (ref 30.0–36.0)
MCV: 87.1 fl (ref 78.0–100.0)
Monocytes Absolute: 0.9 10*3/uL (ref 0.1–1.0)
Monocytes Relative: 10.6 % (ref 3.0–12.0)
Neutro Abs: 5.6 10*3/uL (ref 1.4–7.7)
Neutrophils Relative %: 66.7 % (ref 43.0–77.0)
Platelets: 201 10*3/uL (ref 150.0–400.0)
RBC: 5.32 Mil/uL (ref 4.22–5.81)
RDW: 13.9 % (ref 11.5–15.5)
WBC: 8.4 10*3/uL (ref 4.0–10.5)

## 2020-03-06 LAB — URINALYSIS, ROUTINE W REFLEX MICROSCOPIC
Bilirubin Urine: NEGATIVE
Hgb urine dipstick: NEGATIVE
Ketones, ur: NEGATIVE
Leukocytes,Ua: NEGATIVE
Nitrite: NEGATIVE
Specific Gravity, Urine: 1.015 (ref 1.000–1.030)
Total Protein, Urine: NEGATIVE
Urine Glucose: NEGATIVE
Urobilinogen, UA: 1 (ref 0.0–1.0)
pH: 6 (ref 5.0–8.0)

## 2020-03-06 LAB — LIPID PANEL
Cholesterol: 193 mg/dL (ref 0–200)
HDL: 40.4 mg/dL (ref >39.00)
NonHDL: 153.01
Total CHOL/HDL Ratio: 5
Triglycerides: 221 mg/dL — ABNORMAL HIGH (ref 0.0–149.0)
VLDL: 44.2 mg/dL — ABNORMAL HIGH (ref 0.0–40.0)

## 2020-03-06 LAB — HEMOGLOBIN A1C: Hgb A1c MFr Bld: 5.9 % (ref 4.6–6.5)

## 2020-03-06 LAB — HEPATIC FUNCTION PANEL
ALT: 17 U/L (ref 0–53)
AST: 17 U/L (ref 0–37)
Albumin: 4.7 g/dL (ref 3.5–5.2)
Alkaline Phosphatase: 36 U/L — ABNORMAL LOW (ref 39–117)
Bilirubin, Direct: 0.2 mg/dL (ref 0.0–0.3)
Total Bilirubin: 1.3 mg/dL — ABNORMAL HIGH (ref 0.2–1.2)
Total Protein: 7.4 g/dL (ref 6.0–8.3)

## 2020-03-06 LAB — VITAMIN D 25 HYDROXY (VIT D DEFICIENCY, FRACTURES): VITD: 36.85 ng/mL (ref 30.00–100.00)

## 2020-03-06 LAB — BASIC METABOLIC PANEL
BUN: 27 mg/dL — ABNORMAL HIGH (ref 6–23)
CO2: 30 mEq/L (ref 19–32)
Calcium: 9.6 mg/dL (ref 8.4–10.5)
Chloride: 101 mEq/L (ref 96–112)
Creatinine, Ser: 1.9 mg/dL — ABNORMAL HIGH (ref 0.40–1.50)
GFR: 34.17 mL/min — ABNORMAL LOW (ref >60.00)
Glucose, Bld: 91 mg/dL (ref 70–99)
Potassium: 4.1 mEq/L (ref 3.5–5.1)
Sodium: 137 mEq/L (ref 135–145)

## 2020-03-06 LAB — LDL CHOLESTEROL, DIRECT: Direct LDL: 119 mg/dL

## 2020-03-06 LAB — TSH: TSH: 1.66 u[IU]/mL (ref 0.35–4.50)

## 2020-03-06 LAB — VITAMIN B12: Vitamin B-12: 471 pg/mL (ref 211–911)

## 2020-03-06 LAB — PSA: PSA: 0.59 ng/mL (ref 0.10–4.00)

## 2020-03-06 MED ORDER — ATORVASTATIN CALCIUM 80 MG PO TABS: 80.0000 mg | ORAL_TABLET | Freq: Every day | ORAL | 3 refills | Status: DC

## 2020-03-06 NOTE — Patient Instructions (Signed)
Please check your BP at home once daily for 10 days and send the average to [place] on Mychart  We could consider increased cardizem if needed  Ok to increase the lipitor to 80 mg per day  Please consider at least yearly follow up with Dermatology  Please continue all other medications as before, and refills have been done if requested.  Please have the pharmacy call with any other refills you may need.  Please continue your efforts at being more active, low cholesterol diet, and weight control.  You are otherwise up to date with prevention measures today.  Please keep your appointments with your specialists as you may have planned  Please go to the LAB at the blood drawing area for the tests to be done  You will be contacted by phone if any changes need to be made immediately.  Otherwise, you will receive a letter about your results with an explanation, but please check with MyChart first.  Please remember to sign up for MyChart if you have not done so, as this will be important to you in the future with finding out test results, communicating by private email, and scheduling acute appointments online when needed.  Please make an Appointment to return in 6 months, or sooner if needed  Good Luck on your Cruise in April!

## 2020-03-06 NOTE — Progress Notes (Signed)
Established Patient Office Visit  Subjective:  Patient ID: Jake Simon, male    DOB: 1945/08/28  Age: 75 y.o. MRN: [id number]       Chief Complaint:  wellness and elev BP, hld, ckd, melanoma       HPI:  Jake Simon is a 75 y.o. male here for wellness overall doing ok;  BP at home have been < 140/0 though has not check recently.  Tyring to follow lower chol diet.  Has hx of melanoma - due for dermatology f/u soon.   Pt denies Chest pain, worsening SOB, DOE, wheezing, orthopnea, PND, worsening LE edema, palpitations, dizziness or syncope.  Pt denies neurological change such as new headache, facial or extremity weakness.  Pt denies polydipsia, polyuria  Good med compliance.   Pt denies worsening depressive symptoms, suicidal ideation or panic. Denies fever, night sweats, wt loss, loss of appetite, or other constitutional symptoms.  Pt states good ability with ADL's, has low fall risk, home safety reviewed and adequate, no other significant changes in hearing or vision, and not active with exercise.        Wt Readings from Last 3 Encounters:  03/06/20 165 lb (74.8 kg)  08/26/19 166 lb (75.3 kg)  08/26/19 166 lb 12.8 oz (75.7 kg)   BP Readings from Last 3 Encounters:  03/06/20 (!) 160/90  08/26/19 138/80  08/26/19 138/80       Past Medical History:  Diagnosis Date  . Allergic rhinitis   . Asthma   . HTN (hypertension)   . Hyperlipidemia    high triglycerides  . Sleep apnea    moderate  . Vision blurred    epithelial basement membrane dystrophy   Past Surgical History:  Procedure Laterality Date  . COLONOSCOPY    . dental implants    . HERNIA REPAIR    . POLYPECTOMY    . TONSILLECTOMY      reports that he has never smoked. He has never used smokeless tobacco. He reports current alcohol use. He reports that he does not use drugs. family history includes Alzheimer's disease in his mother; Arthritis in his sister; Asthma in his father; Breast cancer in his mother; COPD in  his father; Cancer in his sister; Mental illness in his mother; Stroke in his father. Allergies  Allergen Reactions  . Losartan Other (See Comments)    CKD   Current Outpatient Medications on File Prior to Visit  Medication Sig Dispense Refill  . albuterol (PROVENTIL HFA;VENTOLIN HFA) 108 (90 Base) MCG/ACT inhaler Inhale 2 puffs into the lungs every 6 (six) hours as needed for wheezing or shortness of breath. 1 Inhaler 5  . diltiazem (CARDIZEM CD) 240 MG 24 hr capsule Take 1 capsule (240 mg total) by mouth daily. 90 capsule 3  . fenofibrate 160 MG tablet TAKE 1 TABLET EVERY DAY 90 tablet 3  . fluticasone (FLOVENT HFA) 110 MCG/ACT inhaler Inhale 2 puffs into the lungs 2 (two) times daily. USE 2 PUFFS DAILY 12 g 5  . furosemide (LASIX) 20 MG tablet Take 1 tablet (20 mg total) by mouth daily. 90 tablet 3  . sodium chloride (MURO 128) 5 % ophthalmic ointment Place 1 application into both eyes as needed for irritation.     No current facility-administered medications on file prior to visit.        ROS:  All others reviewed and negative.  Objective        PE:  BP (!) 160/90 (BP Location: Left  Arm, Patient Position: Sitting, Cuff Size: Large)   Pulse 85   Temp 99 F (37.2 C) (Oral)   Ht 5\' 6"  (1.676 m)   Wt 165 lb (74.8 kg)   SpO2 97%   BMI 26.63 kg/m                 Constitutional: Pt appears in NAD               HENT: Head: NCAT.                Right Ear: External ear normal.                 Left Ear: External ear normal.                Eyes: . Pupils are equal, round, and reactive to light. Conjunctivae and EOM are normal               Nose: without d/c or deformity               Neck: Neck supple. Gross normal ROM               Cardiovascular: Normal rate and regular rhythm.                 Pulmonary/Chest: Effort normal and breath sounds without rales or wheezing.                Abd:  Soft, NT, ND, + BS, no organomegaly               Neurological: Pt is alert. At baseline  orientation, motor grossly intact               Skin: Skin is warm. No rashes, no other new lesions, LE edema - none               Psychiatric: Pt behavior is normal without agitation   Assessment/Plan:  Jake Simon is a 75 y.o. White or Caucasian [1] male with  has a past medical history of Allergic rhinitis, Asthma, HTN (hypertension), Hyperlipidemia, Sleep apnea, and Vision blurred.  Assessment Plan  See notes Labs reviewed for each problem: Lab Results  Component Value Date   WBC 8.4 03/06/2020   HGB 16.3 03/06/2020   HCT 46.3 03/06/2020   PLT 201.0 03/06/2020   GLUCOSE 91 03/06/2020   CHOL 193 03/06/2020   TRIG 221.0 (H) 03/06/2020   HDL 40.40 03/06/2020   LDLDIRECT 119.0 03/06/2020   LDLCALC 100 (H) 08/26/2019   ALT 17 03/06/2020   AST 17 03/06/2020   NA 137 03/06/2020   K 4.1 03/06/2020   CL 101 03/06/2020   CREATININE 1.90 (H) 03/06/2020   BUN 27 (H) 03/06/2020   CO2 30 03/06/2020   TSH 1.66 03/06/2020   PSA 0.59 03/06/2020   HGBA1C 5.9 03/06/2020    Micro: none  Cardiac tracings I have personally interpreted today:  none  Pertinent Radiological findings (summarize): none    Problem List Items Addressed This Visit      High   Encounter for well adult exam with abnormal findings - Primary    Overall doing well, age appropriate education and counseling updated, referrals for preventative services and immunizations addressed, dietary and smoking counseling addressed, most recent labs reviewed.  I have personally reviewed and have noted:  1) the patient's medical and social history 2) The pt's use of alcohol, tobacco, and illicit drugs 3) The patient's  current medications and supplements 4) Functional ability including ADL's, fall risk, home safety risk, hearing and visual impairment 5) Diet and physical activities 6) Evidence for depression or mood disorder 7) The patient's height, weight, and BMI have been recorded in the chart  I have made referrals,  and provided counseling and education based on review of the above       Relevant Orders   PSA (Completed)     Medium   Hypertension, uncontrolled    Mild elev today, pt states better controlled at home, ok to cont same tx, and pt ot check BP at home dialy for 10 days and call with average; cont cardizen lasix, bystolic as is for now  BP Readings from Last 3 Encounters:  03/06/20 (!) 160/90  08/26/19 138/80  08/26/19 138/80     Current Outpatient Medications (Cardiovascular):  .  atorvastatin (LIPITOR) 80 MG tablet, Take 1 tablet (80 mg total) by mouth daily. Marland Kitchen  diltiazem (CARDIZEM CD) 240 MG 24 hr capsule, Take 1 capsule (240 mg total) by mouth daily. .  fenofibrate 160 MG tablet, TAKE 1 TABLET EVERY DAY .  furosemide (LASIX) 20 MG tablet, Take 1 tablet (20 mg total) by mouth daily. .  nebivolol (BYSTOLIC) 10 MG tablet, Take 1 tablet (10 mg total) by mouth daily.  Current Outpatient Medications (Respiratory):  .  albuterol (PROVENTIL HFA;VENTOLIN HFA) 108 (90 Base) MCG/ACT inhaler, Inhale 2 puffs into the lungs every 6 (six) hours as needed for wheezing or shortness of breath. .  fluticasone (FLOVENT HFA) 110 MCG/ACT inhaler, Inhale 2 puffs into the lungs 2 (two) times daily. USE 2 PUFFS DAILY    Current Outpatient Medications (Other):  .  sodium chloride (MURO 128) 5 % ophthalmic ointment, Place 1 application into both eyes as needed for irritation.       Relevant Medications   atorvastatin (LIPITOR) 80 MG tablet   Hyperlipidemia    Lab Results  Component Value Date   LDLCALC 100 (H) 08/26/2019   Stable, pt to continue current statin lipitor 80      Relevant Medications   atorvastatin (LIPITOR) 80 MG tablet   Other Relevant Orders   Lipid panel (Completed)   Hepatic function panel (Completed)   CBC with Differential/Platelet (Completed)   TSH (Completed)   Urinalysis, Routine w reflex microscopic (Completed)   Basic metabolic panel (Completed)   LDL  cholesterol, direct (Completed)   Hyperglycemia    Lab Results  Component Value Date   HGBA1C 5.9 03/06/2020   Stable, pt to continue current medical treatment  - diet      Relevant Orders   Hemoglobin A1c (Completed)   CKD (chronic kidney disease) stage 3, GFR 30-59 ml/min (HCC)    Lab Results  Component Value Date   CREATININE 1.90 (H) 03/06/2020   Stable overall, cont to avoid nephrotoxins       Other Visit Diagnoses    B12 deficiency       Vitamin D deficiency          Meds ordered this encounter  Medications  . atorvastatin (LIPITOR) 80 MG tablet    Sig: Take 1 tablet (80 mg total) by mouth daily.    Dispense:  90 tablet    Refill:  3    Follow-up: Return in about 6 months (around 09/03/2020).   Oliver Barre, MD 03/06/2020 11:24 AM [facility]

## 2020-03-11 IMAGING — CT CT ANGIO CHEST
2 of 7 series · 19 of 36 positions shown · IV contrast (ISOVUE 370)
Comparison: Chest x-ray dated September 08, 2017.

CLINICAL DATA: Cough and elevated D-dimer with recent long travel.
Evaluate for pulmonary embolism.

EXAM:
CT ANGIOGRAPHY CHEST WITH CONTRAST
TECHNIQUE: Multidetector CT imaging of the chest was performed using the
standard protocol during bolus administration of intravenous
contrast. Multiplanar CT image reconstructions and MIPs were
obtained to evaluate the vascular anatomy.
CONTRAST:  65mL [id number] IOPAMIDOL ([id number]) INJECTION 76%

[Series 5: thins · axial · 0.65mm/px · z∈[-279,-22]mm · 18 of 287 slices shown]
[im 15/287  lung]
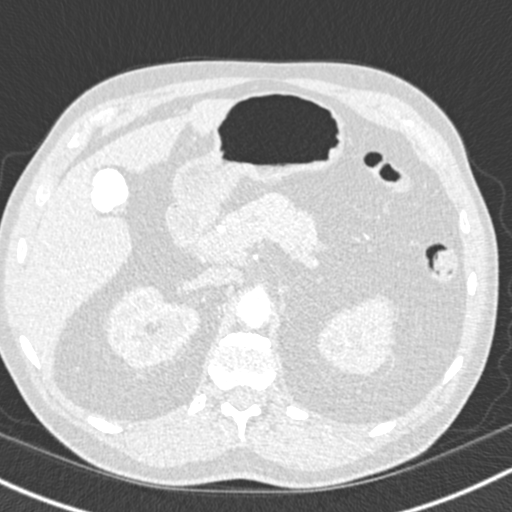
[im 29/287  mediastinal]
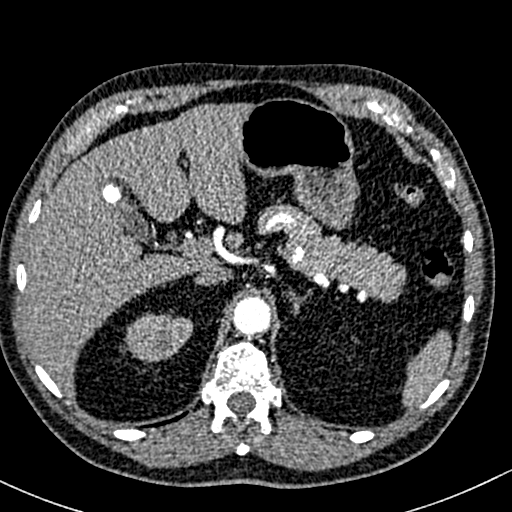
[im 43/287  lung]
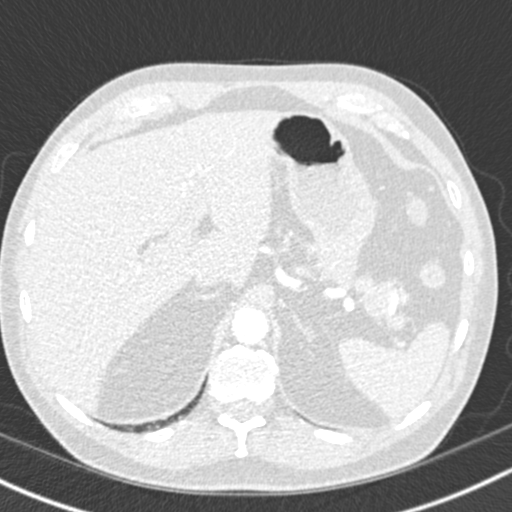
[im 58/287  mediastinal]
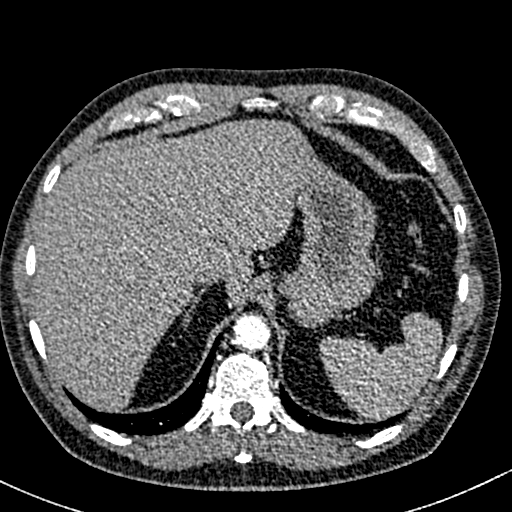
[im 72/287  lung]
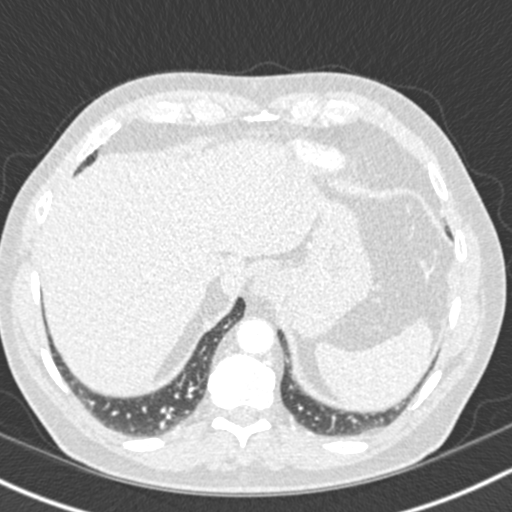
[im 86/287  mediastinal]
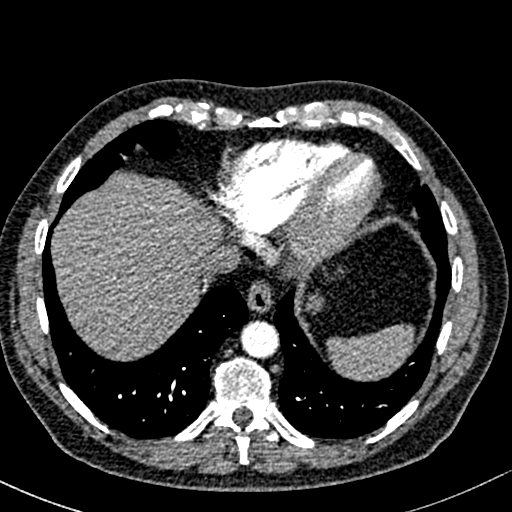
[im 101/287  lung]
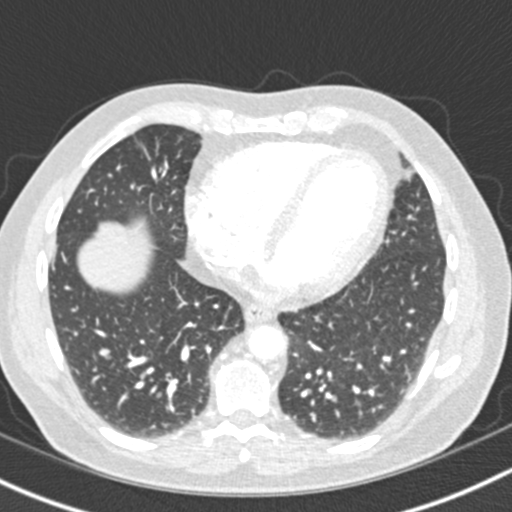
[im 115/287  mediastinal]
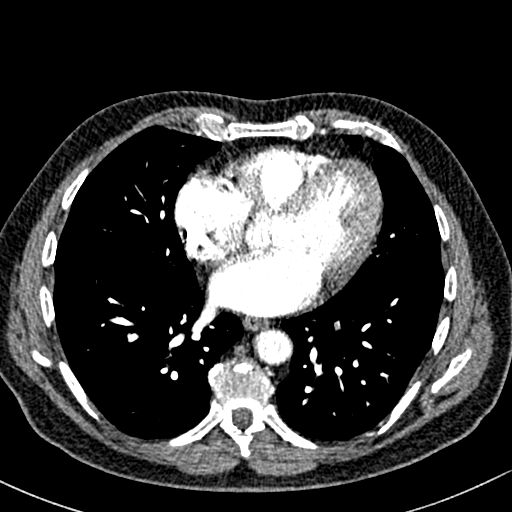
[im 129/287  lung]
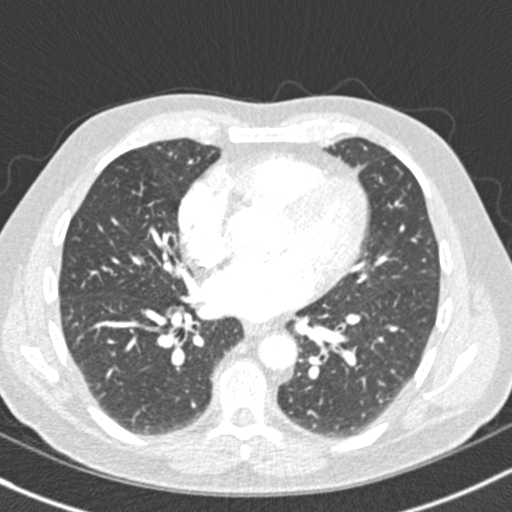
[im 158/287  mediastinal]
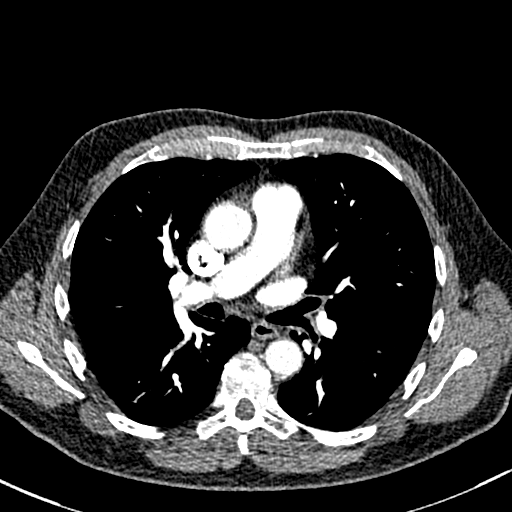
[im 172/287  lung]
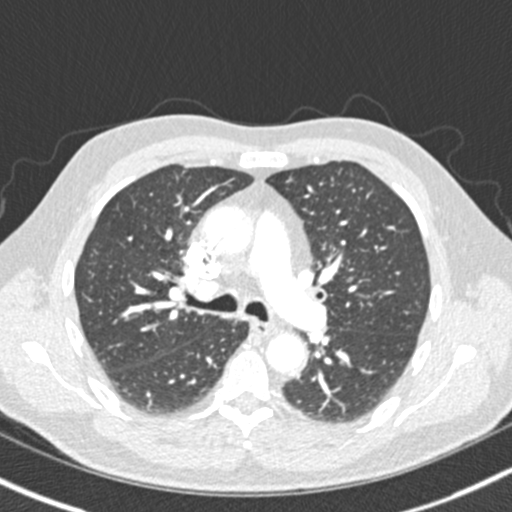
[im 186/287  mediastinal]
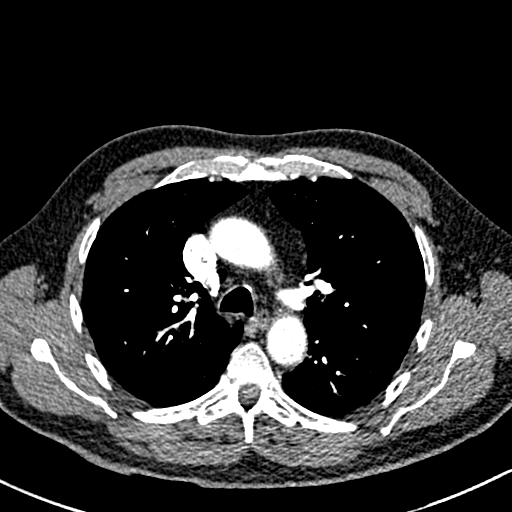
[im 201/287  lung]
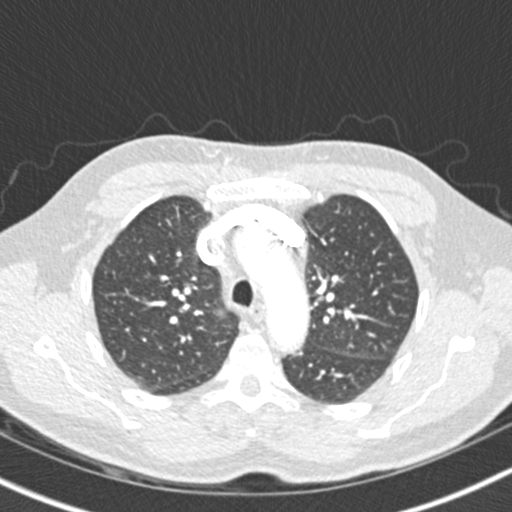
[im 215/287  mediastinal]
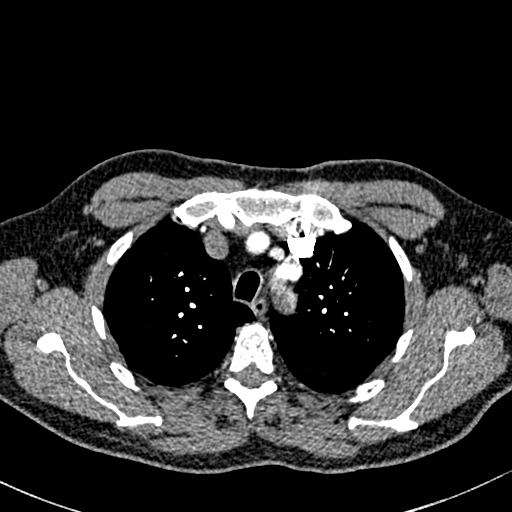
[im 229/287  lung]
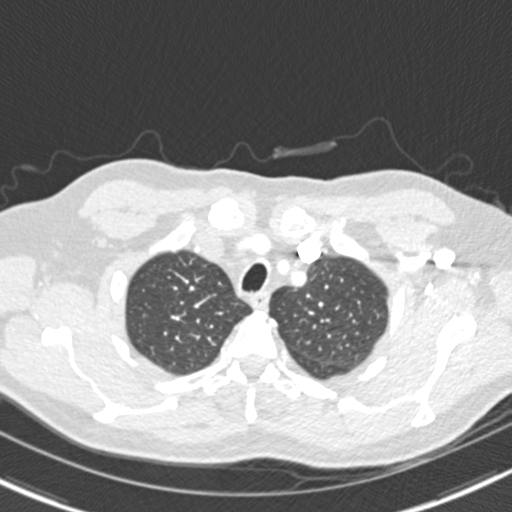
[im 244/287  mediastinal]
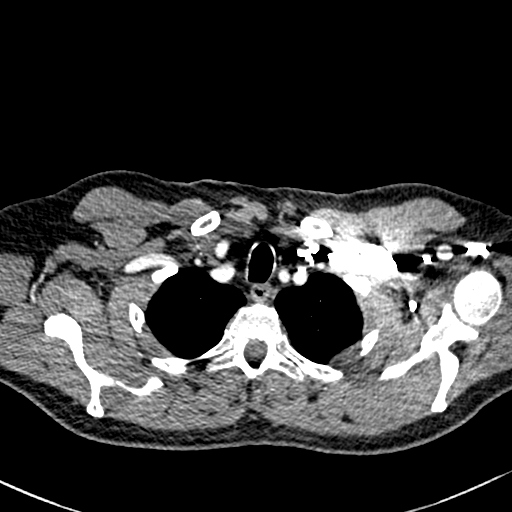
[im 258/287  lung]
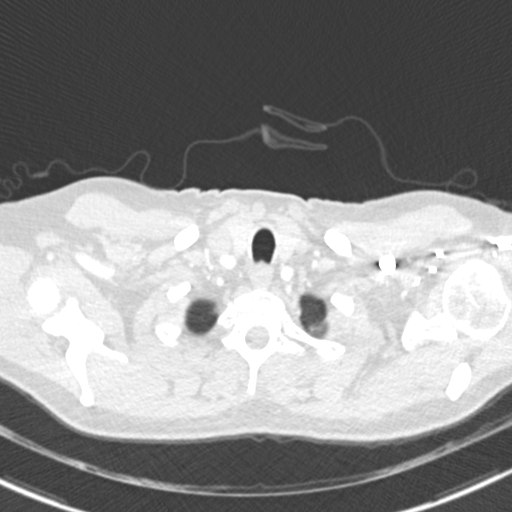
[im 272/287  mediastinal]
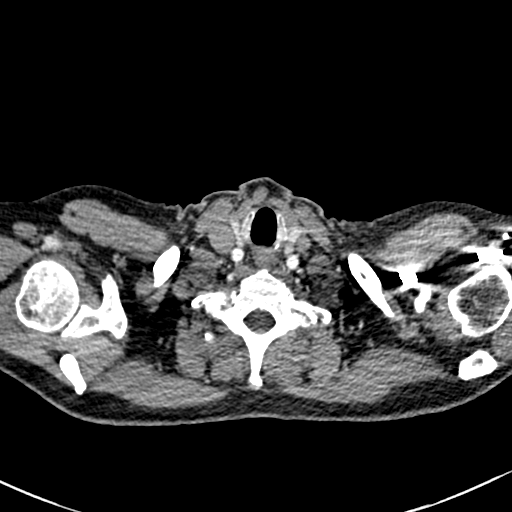

[Series 7: coronal mpr · coronal · 0.59mm/px · 1 of 123 slices shown]
[im 62/123  mediastinal]
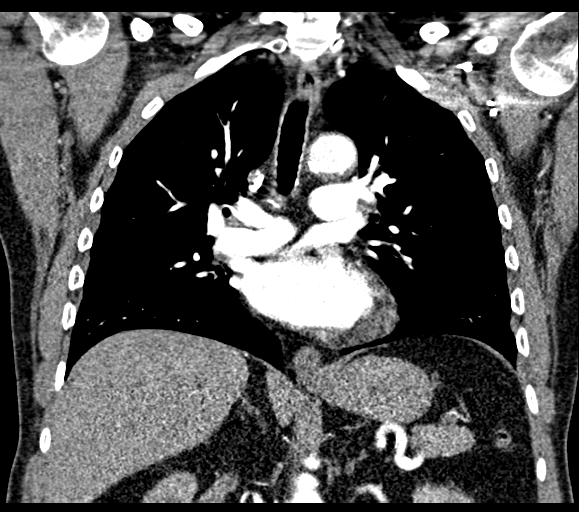

[19 of 36 positions shown; findings below may reference images not displayed]

FINDINGS: Cardiovascular: Satisfactory opacification of the pulmonary arteries
to the segmental level. No evidence of pulmonary embolism. Normal
heart size. No pericardial effusion. Normal caliber thoracic aorta.
No aortic dissection. Mild aortic atherosclerosis.

Mediastinum/Nodes: No enlarged mediastinal, hilar, or axillary lymph
nodes. Thyroid gland, trachea, and esophagus demonstrate no
significant findings.

Lungs/Pleura: No focal consolidation, pleural effusion, or
pneumothorax. No suspicious pulmonary nodule.

Upper Abdomen: No acute abnormality.  Cholelithiasis.

Musculoskeletal: No chest wall abnormality. No acute or significant
osseous findings.

Review of the MIP images confirms the above findings.
IMPRESSION: 1. No evidence of pulmonary embolism. No acute intrathoracic
process.
2.  Aortic atherosclerosis ([id number].C).
3. Cholelithiasis.

## 2020-03-16 ENCOUNTER — Encounter: Payer: Self-pay | Admitting: Internal Medicine

## 2020-03-16 MED ORDER — LOSARTAN POTASSIUM 50 MG PO TABS: 50.0000 mg | ORAL_TABLET | Freq: Every day | ORAL | 3 refills | Status: DC

## 2020-03-19 ENCOUNTER — Encounter: Payer: Self-pay | Admitting: Internal Medicine

## 2020-03-19 MED ORDER — HYDROCHLOROTHIAZIDE 12.5 MG PO CAPS: 12.5000 mg | ORAL_CAPSULE | Freq: Every day | ORAL | 3 refills | Status: DC

## 2020-03-19 MED ORDER — NEBIVOLOL HCL 10 MG PO TABS: 10.0000 mg | ORAL_TABLET | Freq: Every day | ORAL | 3 refills | Status: DC

## 2020-03-19 NOTE — Assessment & Plan Note (Signed)
Lab Results  Component Value Date   LDLCALC 100 (H) 08/26/2019   Stable, pt to continue current statin lipitor 80

## 2020-03-19 NOTE — Assessment & Plan Note (Signed)
Lab Results  Component Value Date   CREATININE 1.90 (H) 03/06/2020   Stable overall, cont to avoid nephrotoxins

## 2020-03-19 NOTE — Assessment & Plan Note (Signed)

## 2020-03-19 NOTE — Assessment & Plan Note (Signed)
Mild elev today, pt states better controlled at home, ok to cont same tx, and pt ot check BP at home dialy for 10 days and call with average; cont cardizen lasix, bystolic as is for now  BP Readings from Last 3 Encounters:  03/06/20 (!) 160/90  08/26/19 138/80  08/26/19 138/80     Current Outpatient Medications (Cardiovascular):  .  atorvastatin (LIPITOR) 80 MG tablet, Take 1 tablet (80 mg total) by mouth daily. Marland Kitchen  diltiazem (CARDIZEM CD) 240 MG 24 hr capsule, Take 1 capsule (240 mg total) by mouth daily. .  fenofibrate 160 MG tablet, TAKE 1 TABLET EVERY DAY .  furosemide (LASIX) 20 MG tablet, Take 1 tablet (20 mg total) by mouth daily. .  nebivolol (BYSTOLIC) 10 MG tablet, Take 1 tablet (10 mg total) by mouth daily.  Current Outpatient Medications (Respiratory):  .  albuterol (PROVENTIL HFA;VENTOLIN HFA) 108 (90 Base) MCG/ACT inhaler, Inhale 2 puffs into the lungs every 6 (six) hours as needed for wheezing or shortness of breath. .  fluticasone (FLOVENT HFA) 110 MCG/ACT inhaler, Inhale 2 puffs into the lungs 2 (two) times daily. USE 2 PUFFS DAILY    Current Outpatient Medications (Other):  .  sodium chloride (MURO 128) 5 % ophthalmic ointment, Place 1 application into both eyes as needed for irritation.

## 2020-03-19 NOTE — Addendum Note (Signed)
Addended by: Biagio Borg on: 03/19/2020 08:48 AM   Modules accepted: Orders

## 2020-03-19 NOTE — Assessment & Plan Note (Signed)
Lab Results  Component Value Date   HGBA1C 5.9 03/06/2020   Stable, pt to continue current medical treatment  - diet

## 2020-03-21 ENCOUNTER — Other Ambulatory Visit: Payer: Self-pay | Admitting: Internal Medicine

## 2020-03-21 NOTE — Telephone Encounter (Signed)
Please refill as per office routine med refill policy (all routine meds refilled for 3 mo or monthly per pt preference up to one year from last visit, then month to month grace period for 3 mo, then further med refills will have to be denied)  

## 2020-03-28 ENCOUNTER — Encounter: Payer: Self-pay | Admitting: Internal Medicine

## 2020-03-29 ENCOUNTER — Encounter: Payer: Self-pay | Admitting: Internal Medicine

## 2020-03-29 MED ORDER — NEBIVOLOL HCL 2.5 MG PO TABS: 2.5000 mg | ORAL_TABLET | Freq: Every day | ORAL | 3 refills | Status: DC

## 2020-04-03 DIAGNOSIS — Z85828 Personal history of other malignant neoplasm of skin: Secondary | ICD-10-CM | POA: Diagnosis not present

## 2020-04-03 DIAGNOSIS — D1801 Hemangioma of skin and subcutaneous tissue: Secondary | ICD-10-CM | POA: Diagnosis not present

## 2020-04-03 DIAGNOSIS — L812 Freckles: Secondary | ICD-10-CM | POA: Diagnosis not present

## 2020-04-03 DIAGNOSIS — L821 Other seborrheic keratosis: Secondary | ICD-10-CM | POA: Diagnosis not present

## 2020-04-03 DIAGNOSIS — L03011 Cellulitis of right finger: Secondary | ICD-10-CM | POA: Diagnosis not present

## 2020-04-05 ENCOUNTER — Other Ambulatory Visit: Payer: Self-pay | Admitting: Internal Medicine

## 2020-04-05 NOTE — Telephone Encounter (Signed)
Please refill as per office routine med refill policy (all routine meds refilled for 3 mo or monthly per pt preference up to one year from last visit, then month to month grace period for 3 mo, then further med refills will have to be denied)  

## 2020-05-19 DIAGNOSIS — Z119 Encounter for screening for infectious and parasitic diseases, unspecified: Secondary | ICD-10-CM | POA: Diagnosis not present

## 2020-05-23 ENCOUNTER — Other Ambulatory Visit: Payer: Self-pay | Admitting: Internal Medicine

## 2020-05-23 NOTE — Telephone Encounter (Signed)
Please refill as per office routine med refill policy (all routine meds refilled for 3 mo or monthly per pt preference up to one year from last visit, then month to month grace period for 3 mo, then further med refills will have to be denied)  

## 2020-07-23 ENCOUNTER — Other Ambulatory Visit: Payer: Self-pay | Admitting: Internal Medicine

## 2020-07-23 NOTE — Telephone Encounter (Signed)
Please refill as per office routine med refill policy (all routine meds refilled for 3 mo or monthly per pt preference up to one year from last visit, then month to month grace period for 3 mo, then further med refills will have to be denied)  

## 2020-07-26 ENCOUNTER — Other Ambulatory Visit: Payer: Self-pay | Admitting: Internal Medicine

## 2020-09-04 ENCOUNTER — Other Ambulatory Visit: Payer: Self-pay

## 2020-09-04 ENCOUNTER — Encounter: Payer: Self-pay | Admitting: Internal Medicine

## 2020-09-04 ENCOUNTER — Ambulatory Visit (INDEPENDENT_AMBULATORY_CARE_PROVIDER_SITE_OTHER): Payer: Medicare HMO | Admitting: Internal Medicine

## 2020-09-04 VITALS — BP 146/68 | HR 75 | Temp 98.7°F | Ht 66.0 in | Wt 163.4 lb

## 2020-09-04 DIAGNOSIS — R739 Hyperglycemia, unspecified: Secondary | ICD-10-CM | POA: Diagnosis not present

## 2020-09-04 DIAGNOSIS — H6122 Impacted cerumen, left ear: Secondary | ICD-10-CM

## 2020-09-04 DIAGNOSIS — H9192 Unspecified hearing loss, left ear: Secondary | ICD-10-CM

## 2020-09-04 DIAGNOSIS — N183 Chronic kidney disease, stage 3 unspecified: Secondary | ICD-10-CM | POA: Diagnosis not present

## 2020-09-04 DIAGNOSIS — I700 Atherosclerosis of aorta: Secondary | ICD-10-CM | POA: Diagnosis not present

## 2020-09-04 DIAGNOSIS — E7800 Pure hypercholesterolemia, unspecified: Secondary | ICD-10-CM

## 2020-09-04 DIAGNOSIS — I10 Essential (primary) hypertension: Secondary | ICD-10-CM | POA: Diagnosis not present

## 2020-09-04 DIAGNOSIS — E559 Vitamin D deficiency, unspecified: Secondary | ICD-10-CM | POA: Diagnosis not present

## 2020-09-04 LAB — CBC WITH DIFFERENTIAL/PLATELET
Basophils Absolute: 0.1 10*3/uL (ref 0.0–0.1)
Basophils Relative: 1.3 % (ref 0.0–3.0)
Eosinophils Absolute: 0.4 10*3/uL (ref 0.0–0.7)
Eosinophils Relative: 4.3 % (ref 0.0–5.0)
HCT: 43.6 % (ref 39.0–52.0)
Hemoglobin: 15 g/dL (ref 13.0–17.0)
Lymphocytes Relative: 20.3 % (ref 12.0–46.0)
Lymphs Abs: 1.7 10*3/uL (ref 0.7–4.0)
MCHC: 34.5 g/dL (ref 30.0–36.0)
MCV: 89.1 fl (ref 78.0–100.0)
Monocytes Absolute: 0.8 10*3/uL (ref 0.1–1.0)
Monocytes Relative: 9.8 % (ref 3.0–12.0)
Neutro Abs: 5.4 10*3/uL (ref 1.4–7.7)
Neutrophils Relative %: 64.3 % (ref 43.0–77.0)
Platelets: 177 10*3/uL (ref 150.0–400.0)
RBC: 4.9 Mil/uL (ref 4.22–5.81)
RDW: 14.4 % (ref 11.5–15.5)
WBC: 8.4 10*3/uL (ref 4.0–10.5)

## 2020-09-04 LAB — BASIC METABOLIC PANEL
BUN: 34 mg/dL — ABNORMAL HIGH (ref 6–23)
CO2: 29 mEq/L (ref 19–32)
Calcium: 9.6 mg/dL (ref 8.4–10.5)
Chloride: 103 mEq/L (ref 96–112)
Creatinine, Ser: 2.01 mg/dL — ABNORMAL HIGH (ref 0.40–1.50)
GFR: 31.83 mL/min — ABNORMAL LOW (ref >60.00)
Glucose, Bld: 115 mg/dL — ABNORMAL HIGH (ref 70–99)
Potassium: 3.8 mEq/L (ref 3.5–5.1)
Sodium: 140 mEq/L (ref 135–145)

## 2020-09-04 LAB — HEPATIC FUNCTION PANEL
ALT: 14 U/L (ref 0–53)
AST: 19 U/L (ref 0–37)
Albumin: 4.4 g/dL (ref 3.5–5.2)
Alkaline Phosphatase: 35 U/L — ABNORMAL LOW (ref 39–117)
Bilirubin, Direct: 0.2 mg/dL (ref 0.0–0.3)
Total Bilirubin: 1 mg/dL (ref 0.2–1.2)
Total Protein: 7.2 g/dL (ref 6.0–8.3)

## 2020-09-04 LAB — HEMOGLOBIN A1C: Hgb A1c MFr Bld: 6 % (ref 4.6–6.5)

## 2020-09-04 LAB — LIPID PANEL
Cholesterol: 175 mg/dL (ref 0–200)
HDL: 39.2 mg/dL (ref >39.00)
NonHDL: 135.67
Total CHOL/HDL Ratio: 4
Triglycerides: 253 mg/dL — ABNORMAL HIGH (ref 0.0–149.0)
VLDL: 50.6 mg/dL — ABNORMAL HIGH (ref 0.0–40.0)

## 2020-09-04 LAB — LDL CHOLESTEROL, DIRECT: Direct LDL: 106 mg/dL

## 2020-09-04 LAB — VITAMIN D 25 HYDROXY (VIT D DEFICIENCY, FRACTURES): VITD: 33.69 ng/mL (ref 30.00–100.00)

## 2020-09-04 MED ORDER — EZETIMIBE 10 MG PO TABS: 10.0000 mg | ORAL_TABLET | Freq: Every day | ORAL | 3 refills | Status: DC

## 2020-09-04 MED ORDER — FLUTICASONE PROPIONATE HFA 110 MCG/ACT IN AERO: 2.0000 | INHALATION_SPRAY | Freq: Two times a day (BID) | RESPIRATORY_TRACT | 3 refills | Status: DC

## 2020-09-04 MED ORDER — ALBUTEROL SULFATE HFA 108 (90 BASE) MCG/ACT IN AERS: 2.0000 | INHALATION_SPRAY | Freq: Four times a day (QID) | RESPIRATORY_TRACT | 3 refills | Status: AC | PRN

## 2020-09-04 NOTE — Patient Instructions (Signed)
Please take all new medication as prescribed - the generic zetia 10 mg per day for cholesterol  Please take OTC Vitamin D3 at 2000 units per day, indefinitely  Your left ear was irrigated of wax today  Please check your BP at home once daily for 10 days and let us know the Average, with the goal to be at least less than 140/90, or 130/80 would be better  Please continue all other medications as before, and refills have been done if requested - the inhalers for 3 mo supplies  Please have the pharmacy call with any other refills you may need.  Please continue your efforts at being more active, low cholesterol diet, and weight control.  Please keep your appointments with your specialists as you may have planned  Please go to the LAB at the blood drawing area for the tests to be done  You will be contacted by phone if any changes need to be made immediately.  Otherwise, you will receive a letter about your results with an explanation, but please check with MyChart first.  Please remember to sign up for MyChart if you have not done so, as this will be important to you in the future with finding out test results, communicating by private email, and scheduling acute appointments online when needed.  Please make an Appointment to return in 6 months, or sooner if needed

## 2020-09-04 NOTE — Progress Notes (Signed)
Patient ID: Jake Simon, male   DOB: 1945-08-02, 75 y.o.   MRN: [id number]        Chief Complaint: follow up HTN, HLD, ckd, low vit d, left ear hearing loss, aortic atherosclerosis       HPI:  Jake Simon is a 75 y.o. male here overall doing ok, Pt denies chest pain, increased sob or doe, wheezing, orthopnea, PND, increased LE swelling, palpitations, dizziness or syncope.   Pt denies polydipsia, polyuria, or new focal neuros s/s.  Has been followed for some time with Dr Shan Levans who released him as renal fxn though severe was stable.  Tested pos covid early may 2022 and requred hotel quantrine, then wife became +, took 12 days after the cruise to get home, never even felt that bad.  .does have sudden onset left ear hearing loss for the past wk without pain, fever, vertigo or tinnitus  Trying to follow lower chol diet.  Not taking Vit D.   Wt Readings from Last 3 Encounters:  09/04/20 163 lb 6.4 oz (74.1 kg)  03/06/20 165 lb (74.8 kg)  08/26/19 166 lb (75.3 kg)   BP Readings from Last 3 Encounters:  09/04/20 (!) 146/68  03/06/20 (!) 160/90  08/26/19 138/80         Past Medical History:  Diagnosis Date   Allergic rhinitis    Asthma    HTN (hypertension)    Hyperlipidemia    high triglycerides   Sleep apnea    moderate   Vision blurred    epithelial basement membrane dystrophy   Past Surgical History:  Procedure Laterality Date   COLONOSCOPY     dental implants     HERNIA REPAIR     POLYPECTOMY     TONSILLECTOMY      reports that he has never smoked. He has never used smokeless tobacco. He reports current alcohol use. He reports that he does not use drugs. family history includes Alzheimer's disease in his mother; Arthritis in his sister; Asthma in his father; Breast cancer in his mother; COPD in his father; Cancer in his sister; Mental illness in his mother; Stroke in his father. Allergies  Allergen Reactions   Losartan Other (See Comments)    CKD   Current  Outpatient Medications on File Prior to Visit  Medication Sig Dispense Refill   atorvastatin (LIPITOR) 80 MG tablet Take 1 tablet (80 mg total) by mouth daily. 90 tablet 3   diltiazem (CARDIZEM CD) 240 MG 24 hr capsule TAKE 1 CAPSULE EVERY DAY 90 capsule 3   fenofibrate 160 MG tablet TAKE 1 TABLET EVERY DAY 90 tablet 3   furosemide (LASIX) 20 MG tablet TAKE 1 TABLET EVERY DAY 90 tablet 3   nebivolol (BYSTOLIC) 2.5 MG tablet Take 1 tablet (2.5 mg total) by mouth daily. 90 tablet 3   sodium chloride (MURO 128) 5 % ophthalmic ointment Place 1 application into both eyes as needed for irritation.     No current facility-administered medications on file prior to visit.        ROS:  All others reviewed and negative.  Objective        PE:  BP (!) 146/68 (BP Location: Right Arm, Patient Position: Sitting, Cuff Size: Normal)   Pulse 75   Temp 98.7 F (37.1 C) (Oral)   Ht 5\' 6"  (1.676 m)   Wt 163 lb 6.4 oz (74.1 kg)   SpO2 97%   BMI 26.37 kg/m  Constitutional: Pt appears in NAD               HENT: Head: NCAT.                Right Ear: External ear normal.                 Left Ear: External ear normal. Left wax impaction irrigated clear and hearing improved               Eyes: . Pupils are equal, round, and reactive to light. Conjunctivae and EOM are normal               Nose: without d/c or deformity               Neck: Neck supple. Gross normal ROM               Cardiovascular: Normal rate and regular rhythm.                 Pulmonary/Chest: Effort normal and breath sounds without rales or wheezing.                Abd:  Soft, NT, ND, + BS, no organomegaly               Neurological: Pt is alert. At baseline orientation, motor grossly intact               Skin: Skin is warm. No rashes, no other new lesions, LE edema - none               Psychiatric: Pt behavior is normal without agitation   Micro: none  Cardiac tracings I have personally interpreted today:   none  Pertinent Radiological findings (summarize): none   Lab Results  Component Value Date   WBC 8.4 09/04/2020   HGB 15.0 09/04/2020   HCT 43.6 09/04/2020   PLT 177.0 09/04/2020   GLUCOSE 115 (H) 09/04/2020   CHOL 175 09/04/2020   TRIG 253.0 (H) 09/04/2020   HDL 39.20 09/04/2020   LDLDIRECT 106.0 09/04/2020   LDLCALC 100 (H) 08/26/2019   ALT 14 09/04/2020   AST 19 09/04/2020   NA 140 09/04/2020   K 3.8 09/04/2020   CL 103 09/04/2020   CREATININE 2.01 (H) 09/04/2020   BUN 34 (H) 09/04/2020   CO2 29 09/04/2020   TSH 1.66 03/06/2020   PSA 0.59 03/06/2020   HGBA1C 6.0 09/04/2020   Assessment/Plan:  Jake Simon is a 75 y.o. White or Caucasian [1] male with  has a past medical history of Allergic rhinitis, Asthma, HTN (hypertension), Hyperlipidemia, Sleep apnea, and Vision blurred.  Aortic atherosclerosis (HCC) Pt encouraged to continue low chol diet, exercise and lipitor 80  Vitamin D deficiency Last vitamin D Lab Results  Component Value Date   VD25OH 36.85 03/06/2020   Low normal, to start oral replacement   CKD (chronic kidney disease) stage 3, GFR 30-59 ml/min Lab Results  Component Value Date   CREATININE 2.01 (H) 09/04/2020   Stable overall, cont to avoid nephrotoxins   Hearing loss, left Improved with irrigation wax impaction  Hyperglycemia Lab Results  Component Value Date   HGBA1C 6.0 09/04/2020   Stable, pt to continue current medical treatment  - diet   Hyperlipidemia Lab Results  Component Value Date   LDLCALC 100 (H) 08/26/2019   uncontrolled, pt to continue current statin lipitor 80, and add zetia 10 qd   Hypertension, uncontrolled BP Readings from  Last 3 Encounters:  09/04/20 (!) 146/68  03/06/20 (!) 160/90  08/26/19 138/80   Uncontrolled, pt to continue medical treatment bystolic no change as declines change, but to check BP at home and call with average in 10 days  Followup: No follow-ups on file.  Cathlean Cower, MD  09/06/2020 9:00 PM [facility]

## 2020-09-04 NOTE — Assessment & Plan Note (Signed)
Pt encouraged to continue low chol diet, exercise and lipitor 80

## 2020-09-04 NOTE — Assessment & Plan Note (Signed)
Last vitamin D Lab Results  Component Value Date   VD25OH 36.85 03/06/2020   Low normal, to start oral replacement

## 2020-09-06 NOTE — Assessment & Plan Note (Signed)
Lab Results  Component Value Date   HGBA1C 6.0 09/04/2020   Stable, pt to continue current medical treatment  - diet

## 2020-09-06 NOTE — Assessment & Plan Note (Signed)
BP Readings from Last 3 Encounters:  09/04/20 (!) 146/68  03/06/20 (!) 160/90  08/26/19 138/80   Uncontrolled, pt to continue medical treatment bystolic no change as declines change, but to check BP at home and call with average in 10 days

## 2020-09-06 NOTE — Assessment & Plan Note (Signed)
Improved with irrigation wax impaction

## 2020-09-06 NOTE — Assessment & Plan Note (Signed)
Lab Results  Component Value Date   CREATININE 2.01 (H) 09/04/2020   Stable overall, cont to avoid nephrotoxins

## 2020-09-06 NOTE — Assessment & Plan Note (Signed)
Lab Results  Component Value Date   LDLCALC 100 (H) 08/26/2019   uncontrolled, pt to continue current statin lipitor 80, and add zetia 10 qd

## 2020-09-17 ENCOUNTER — Encounter: Payer: Self-pay | Admitting: Internal Medicine

## 2020-10-24 ENCOUNTER — Ambulatory Visit: Payer: Medicare HMO

## 2020-11-06 ENCOUNTER — Other Ambulatory Visit: Payer: Self-pay

## 2020-11-06 ENCOUNTER — Ambulatory Visit (INDEPENDENT_AMBULATORY_CARE_PROVIDER_SITE_OTHER): Payer: Medicare HMO

## 2020-11-06 DIAGNOSIS — Z23 Encounter for immunization: Secondary | ICD-10-CM | POA: Diagnosis not present

## 2020-11-07 ENCOUNTER — Telehealth: Payer: Self-pay

## 2020-11-07 MED ORDER — EZETIMIBE 10 MG PO TABS: 10.0000 mg | ORAL_TABLET | Freq: Every day | ORAL | 2 refills | Status: DC

## 2020-11-07 NOTE — Telephone Encounter (Signed)
Please advise as the pt has stated he is in need of his medication ezetimibe (ZETIA) 10 MG tablet be sent into [facility] with a 90 supply.  **Pt states a fax has been sent in from pharmacy about this change as well.  **Pt no longer wants med sent to [facility].

## 2020-11-27 ENCOUNTER — Ambulatory Visit: Payer: Medicare HMO | Attending: Internal Medicine

## 2020-11-27 DIAGNOSIS — Z23 Encounter for immunization: Secondary | ICD-10-CM

## 2020-11-27 NOTE — Progress Notes (Signed)
   Covid-19 Vaccination Clinic  Name:  Jake Simon    MRN: [id number] DOB: 09-23-1945  11/27/2020  [name] was observed post Covid-19 immunization for 15 minutes without incident. He was provided with Vaccine Information Sheet and instruction to access the V-Safe system.   [name] was instructed to call [phone] with any severe reactions post vaccine: Difficulty breathing  Swelling of face and throat  A fast heartbeat  A bad rash all over body  Dizziness and weakness

## 2020-12-11 ENCOUNTER — Ambulatory Visit (INDEPENDENT_AMBULATORY_CARE_PROVIDER_SITE_OTHER): Payer: Medicare HMO

## 2020-12-11 ENCOUNTER — Other Ambulatory Visit: Payer: Self-pay

## 2020-12-11 VITALS — BP 136/80 | HR 86 | Temp 98.1°F | Resp 16 | Ht 66.0 in | Wt 165.6 lb

## 2020-12-11 DIAGNOSIS — Z0000 Encounter for general adult medical examination without abnormal findings: Secondary | ICD-10-CM | POA: Diagnosis not present

## 2020-12-11 NOTE — Progress Notes (Addendum)
Subjective:   Jake Simon is a 75 y.o. male who presents for Medicare Annual/Subsequent preventive examination.  Review of Systems     Cardiac Risk Factors include: advanced age (>64men, >41 women);dyslipidemia;family history of premature cardiovascular disease;hypertension;male gender     Objective:    Today's Vitals   12/11/20 1121  BP: 136/80  Pulse: 86  Resp: 16  Temp: 98.1 F (36.7 C)  SpO2: 96%  Weight: 165 lb 9.6 oz (75.1 kg)  Height: 5\' 6"  (1.676 m)  PainSc: 0-No pain   Body mass index is 26.73 kg/m.  Advanced Directives 12/11/2020 08/26/2019 10/09/2017 09/30/2016 05/02/2015 02/11/2015 06/19/2014  Does Patient Have a Medical Advance Directive? Yes Yes Yes Yes Yes No Yes  Type of Advance Directive Living will;Healthcare Power of [place];Living will [place];Living will - - -  Does patient want to make changes to medical advance directive? No - Patient declined No - Patient declined - - - - -  Copy of [place] in Chart? No - copy requested - Yes No - copy requested Yes - -    Current Medications (verified) Outpatient Encounter Medications as of 12/11/2020  Medication Sig   albuterol (VENTOLIN HFA) 108 (90 Base) MCG/ACT inhaler Inhale 2 puffs into the lungs every 6 (six) hours as needed for wheezing or shortness of breath.   atorvastatin (LIPITOR) 80 MG tablet Take 1 tablet (80 mg total) by mouth daily.   diltiazem (CARDIZEM CD) 240 MG 24 hr capsule TAKE 1 CAPSULE EVERY DAY   ezetimibe (ZETIA) 10 MG tablet Take 1 tablet (10 mg total) by mouth daily.   fenofibrate 160 MG tablet TAKE 1 TABLET EVERY DAY   fluticasone (FLOVENT HFA) 110 MCG/ACT inhaler Inhale 2 puffs into the lungs 2 (two) times daily.   furosemide (LASIX) 20 MG tablet TAKE 1 TABLET EVERY DAY   nebivolol (BYSTOLIC) 2.5 MG tablet Take 1 tablet (2.5 mg total) by mouth daily.   sodium chloride (MURO 128) 5 % ophthalmic ointment Place 1  application into both eyes as needed for irritation.   No facility-administered encounter medications on file as of 12/11/2020.    Allergies (verified) Losartan   History: Past Medical History:  Diagnosis Date   Allergic rhinitis    Asthma    HTN (hypertension)    Hyperlipidemia    high triglycerides   Sleep apnea    moderate   Vision blurred    epithelial basement membrane dystrophy   Past Surgical History:  Procedure Laterality Date   COLONOSCOPY     dental implants     HERNIA REPAIR     POLYPECTOMY     TONSILLECTOMY     Family History  Problem Relation Age of Onset   Alzheimer's disease Mother    Breast cancer Mother    Mental illness Mother        alzheimers   COPD Father    Asthma Father    Stroke Father    Cancer Sister        brain cancer   Arthritis Sister    Diabetes Neg Hx    Prostate cancer Neg Hx    Colon cancer Neg Hx    Heart disease Neg Hx    Hyperlipidemia Neg Hx    Rectal cancer Neg Hx    Stomach cancer Neg Hx    Social History   Socioeconomic History   Marital status: Married    Spouse name: Not  on file   Number of children: 0   Years of education: 85   Highest education level: Not on file  Occupational History   Occupation: Center for [employer]    Comment: retired  Tobacco Use   Smoking status: Never   Smokeless tobacco: Never  Vaping Use   Vaping Use: Never used  Substance and Sexual Activity   Alcohol use: Yes    Alcohol/week: 0.0 standard drinks    Comment: occasional beer, wine   Drug use: No   Sexual activity: Not Currently  Other Topics Concern   Not on file  Social History Narrative     [place] [employer]; [place] @ [employer] [facility] [employer] [facility] [employer] [facility] [employer] for [employer].  Now retired Nurse, adult for [employer].  Has pet. Playing contract bridge. ACP - raised the issues and encouraged the appointment of a HCPOA and to consider detailed directive re: care.             Social Determinants of Health   Financial Resource Strain: Low  Risk    Difficulty of Paying Living Expenses: Not hard at all  Food Insecurity: No Food Insecurity   Worried About Charity fundraiser in the Last Year: Never true   [place] in the Last Year: Never true  Transportation Needs: No Transportation Needs   Lack of Transportation (Medical): No   Lack of Transportation (Non-Medical): No  Physical Activity: Sufficiently Active   Days of Exercise per Week: 7 days   Minutes of Exercise per Session: 30 min  Stress: No Stress Concern Present   Feeling of Stress : Not at all  Social Connections: Socially Integrated   Frequency of Communication with Friends and Family: More than three times a week   Frequency of Social Gatherings with Friends and Family: More than three times a week   Attends Religious Services: More than 4 times per year   Active Member of [employer] or Organizations: Yes   Attends Music therapist: More than 4 times per year   Marital Status: Married    Tobacco Counseling Counseling given: Not Answered   Clinical Intake:  Pre-visit preparation completed: Yes  Pain : No/denies pain Pain Score: 0-No pain     BMI - recorded: 26.73 Nutritional Status: BMI 25 -29 Overweight Nutritional Risks: None Diabetes: No  How often do you need to have someone help you when you read instructions, pamphlets, or other written materials from your doctor or pharmacy?: 1 - Never What is the last grade level you completed in school?: Graduate School  Diabetic? no  Interpreter Needed?: No  Information entered by :: Lisette Abu, LPN   Activities of Daily Living In your present state of health, do you have any difficulty performing the following activities: 12/11/2020 09/04/2020  Hearing? N N  Vision? N N  Difficulty concentrating or making decisions? N N  Walking or climbing stairs? N N  Dressing or bathing? N N  Doing errands, shopping? N N  Preparing Food and eating ? N -  Using the Toilet? N -  In the  past six months, have you accidently leaked urine? N -  Do you have problems with loss of bowel control? N -  Managing your Medications? N -  Managing your Finances? N -  Housekeeping or managing your Housekeeping? N -  Some recent data might be hidden    Patient Care Team: Biagio Borg, MD as PCP - General (Internal Medicine) Inda Castle, MD (Inactive) (Gastroenterology)  Clent Jacks, MD (Ophthalmology) Carolan Clines, MD (Inactive) (Urology) Jarome Matin, MD as Consulting Physician (Dermatology)  Indicate any recent Medical Services you may have received from other than [facility] providers in the past year (date may be approximate).     Assessment:   This is a routine wellness examination for Jake Simon.  Hearing/Vision screen Hearing Screening - Comments:: Patient denied any hearing difficulty.   No hearing aids.  Vision Screening - Comments:: Patient wears corrective glasses/contacts.  Eye exam done annually by: Clent Jacks, MD.  Dietary issues and exercise activities discussed: Current Exercise Habits: Home exercise routine, Type of exercise: walking, Time (Minutes): 30, Frequency (Times/Week): 7, Weekly Exercise (Minutes/Week): 210, Intensity: Moderate, Exercise limited by: None identified   Goals Addressed   None   Depression Screen PHQ 2/9 Scores 12/11/2020 09/04/2020 03/06/2020 08/26/2019 02/23/2019 01/14/2018 10/09/2017  PHQ - 2 Score 0 0 0 0 0 0 0  PHQ- 9 Score - - - - - - -    Fall Risk Fall Risk  12/11/2020 09/04/2020 03/06/2020 08/26/2019 02/23/2019  Falls in the past year? 0 0 0 0 0  Number falls in past yr: 0 0 - 0 0  Injury with Fall? 0 0 - 0 0  Risk for fall due to : No Fall Risks - - No Fall Risks -  Follow up Falls evaluation completed - - Falls evaluation completed -    FALL RISK PREVENTION PERTAINING TO THE HOME:  Any stairs in or around the home? Yes  If so, are there any without handrails? No  Home free of loose throw rugs in walkways, pet beds,  electrical cords, etc? Yes  Adequate lighting in your home to reduce risk of falls? Yes   ASSISTIVE DEVICES UTILIZED TO PREVENT FALLS:  Life alert? No  Use of a cane, walker or w/c? No  Grab bars in the bathroom? Yes  Shower chair or bench in shower? No  Elevated toilet seat or a handicapped toilet? Yes   TIMED UP AND GO:  Was the test performed? Yes .  Length of time to ambulate 10 feet: 5 sec.   Gait steady and fast without use of assistive device  Cognitive Function: Normal cognitive status assessed by direct observation by this Nurse Health Advisor. No abnormalities found.   MMSE - Mini Mental State Exam 05/02/2015  Not completed: (No Data)     6CIT Screen 08/26/2019  What Year? 0 points  What month? 0 points  What time? 0 points  Count back from 20 0 points  Months in reverse 0 points  Repeat phrase 0 points  Total Score 0    Immunizations Immunization History  Administered Date(s) Administered   Fluad Quad(high Dose 65+) 12/04/2018, 12/29/2019, 11/06/2020   H1N1 02/15/2008   Influenza Split 12/10/2010, 11/11/2011   Influenza Whole 01/05/2008, 12/12/2008, 12/11/2009   Influenza, High Dose Seasonal PF 12/08/2013, 11/22/2015, 11/25/2016, 12/31/2017   Influenza,inj,Quad PF,6+ Mos 11/23/2012, 11/16/2014   PFIZER(Purple Top)SARS-COV-2 Vaccination 04/10/2019, 05/05/2019, 01/16/2020, 11/27/2020   Pfizer Covid-19 Vaccine Bivalent Booster 8yrs & up 11/27/2020   Pneumococcal Conjugate-13 09/20/2013   Pneumococcal Polysaccharide-23 05/22/2009   Td 05/02/2008   Tdap 08/24/2018   Zoster Recombinat (Shingrix) 10/06/2016, 09/10/2017   Zoster, Live 08/20/2009    TDAP status: Up to date  Flu Vaccine status: Up to date  Pneumococcal vaccine status: Up to date  Covid-19 vaccine status: Completed vaccines  Qualifies for Shingles Vaccine? Yes   Zostavax completed Yes   Shingrix Completed?: Yes  Screening  Tests Health Maintenance  Topic Date Due   COLONOSCOPY (Pts  45-5yrs Insurance coverage will need to be confirmed)  06/18/2024   TETANUS/TDAP  08/23/2028   INFLUENZA VACCINE  Completed   COVID-19 Vaccine  Completed   Hepatitis C Screening  Completed   Zoster Vaccines- Shingrix  Completed   HPV VACCINES  Aged Out    Health Maintenance  There are no preventive care reminders to display for this patient.   Colorectal cancer screening: Type of screening: Colonoscopy. Completed 06/19/2014. Repeat every 10 years  Lung Cancer Screening: (Low Dose CT Chest recommended if Age 58-80 years, 30 pack-year currently smoking OR have quit w/in 15years.) does not qualify.   Lung Cancer Screening Referral: no  Additional Screening:  Hepatitis C Screening: does qualify; Completed yes  Vision Screening: Recommended annual ophthalmology exams for early detection of glaucoma and other disorders of the eye. Is the patient up to date with their annual eye exam?  Yes  Who is the provider or what is the name of the office in which the patient attends annual eye exams? Clent Jacks, MD. If pt is not established with a provider, would they like to be referred to a provider to establish care? No .   Dental Screening: Recommended annual dental exams for proper oral hygiene  Community Resource Referral / Chronic Care Management: CRR required this visit?  No   CCM required this visit?  No      Plan:     I have personally reviewed and noted the following in the patient's chart:   Medical and social history Use of alcohol, tobacco or illicit drugs  Current medications and supplements including opioid prescriptions. Patient is not currently taking opioid prescriptions. Functional ability and status Nutritional status Physical activity Advanced directives List of other physicians Hospitalizations, surgeries, and ER visits in previous 12 months Vitals Screenings to include cognitive, depression, and falls Referrals and appointments  In addition, I have  reviewed and discussed with patient certain preventive protocols, quality metrics, and best practice recommendations. A written personalized care plan for preventive services as well as general preventive health recommendations were provided to patient.     Sheral Flow, LPN   [id number]   Nurse Notes:  Hearing Screening - Comments:: Patient denied any hearing difficulty.   No hearing aids.  Vision Screening - Comments:: Patient wears corrective glasses/contacts.  Eye exam done annually by: Clent Jacks, MD.   Medical screening examination/treatment/procedure(s) were performed by non-physician practitioner and as supervising physician I was immediately available for consultation/collaboration.  I agree with above. Cathlean Cower, MD

## 2020-12-11 NOTE — Patient Instructions (Signed)
Jake Simon , Thank you for taking time to come for your Medicare Wellness Visit. I appreciate your ongoing commitment to your health goals. Please review the following plan we discussed and let me know if I can assist you in the future.   Screening recommendations/referrals: Colonoscopy: 06/19/2014; due every 10 years Recommended yearly ophthalmology/optometry visit for glaucoma screening and checkup Recommended yearly dental visit for hygiene and checkup  Vaccinations: Influenza vaccine: 11/06/2020 Pneumococcal vaccine: 05/22/2009, 09/20/2013 Tdap vaccine: 08/24/2018; due every 10 years Shingles vaccine: 10/06/2016, 09/10/2017   Covid-19: 04/10/2019, 05/05/2019, 01/16/2020, 11/27/2020  Advanced directives: Please bring a copy of your health care power of attorney and living will to the office at your convenience.  Conditions/risks identified: Yes; Client understands the importance of follow-up with providers by attending scheduled visits and discussed goals to eat healthier, increase physical activity, exercise the brain, socialize more, get enough sleep and make time for laughter.  Next appointment: Please schedule your next Medicare Wellness Visit with your Nurse Health Advisor in 1 year by calling [phone].  Preventive Care 23 Years and Older, Male Preventive care refers to lifestyle choices and visits with your health care provider that can promote health and wellness. What does preventive care include? A yearly physical exam. This is also called an annual well check. Dental exams once or twice a year. Routine eye exams. Ask your health care provider how often you should have your eyes checked. Personal lifestyle choices, including: Daily care of your teeth and gums. Regular physical activity. Eating a healthy diet. Avoiding tobacco and drug use. Limiting alcohol use. Practicing safe sex. Taking low doses of aspirin every day. Taking vitamin and mineral supplements as recommended by  your health care provider. What happens during an annual well check? The services and screenings done by your health care provider during your annual well check will depend on your age, overall health, lifestyle risk factors, and family history of disease. Counseling  Your health care provider may ask you questions about your: Alcohol use. Tobacco use. Drug use. Emotional well-being. Home and relationship well-being. Sexual activity. Eating habits. History of falls. Memory and ability to understand (cognition). Work and work Statistician. Screening  You may have the following tests or measurements: Height, weight, and BMI. Blood pressure. Lipid and cholesterol levels. These may be checked every 5 years, or more frequently if you are over 66 years old. Skin check. Lung cancer screening. You may have this screening every year starting at age 43 if you have a 30-pack-year history of smoking and currently smoke or have quit within the past 15 years. Fecal occult blood test (FOBT) of the stool. You may have this test every year starting at age 2. Flexible sigmoidoscopy or colonoscopy. You may have a sigmoidoscopy every 5 years or a colonoscopy every 10 years starting at age 53. Prostate cancer screening. Recommendations will vary depending on your family history and other risks. Hepatitis C blood test. Hepatitis B blood test. Sexually transmitted disease (STD) testing. Diabetes screening. This is done by checking your blood sugar (glucose) after you have not eaten for a while (fasting). You may have this done every 1-3 years. Abdominal aortic aneurysm (AAA) screening. You may need this if you are a current or former smoker. Osteoporosis. You may be screened starting at age 7 if you are at high risk. Talk with your health care provider about your test results, treatment options, and if necessary, the need for more tests. Vaccines  Your health care provider may  recommend certain vaccines,  such as: Influenza vaccine. This is recommended every year. Tetanus, diphtheria, and acellular pertussis (Tdap, Td) vaccine. You may need a Td booster every 10 years. Zoster vaccine. You may need this after age 95. Pneumococcal 13-valent conjugate (PCV13) vaccine. One dose is recommended after age 34. Pneumococcal polysaccharide (PPSV23) vaccine. One dose is recommended after age 45. Talk to your health care provider about which screenings and vaccines you need and how often you need them. This information is not intended to replace advice given to you by your health care provider. Make sure you discuss any questions you have with your health care provider. Document Released: 03/16/2015 Document Revised: 11/07/2015 Document Reviewed: 12/19/2014 Elsevier Interactive Patient Education  2017 [place] Prevention in the Home Falls can cause injuries. They can happen to people of all ages. There are many things you can do to make your home safe and to help prevent falls. What can I do on the outside of my home? Regularly fix the edges of walkways and driveways and fix any cracks. Remove anything that might make you trip as you walk through a door, such as a raised step or threshold. Trim any bushes or trees on the path to your home. Use bright outdoor lighting. Clear any walking paths of anything that might make someone trip, such as rocks or tools. Regularly check to see if handrails are loose or broken. Make sure that both sides of any steps have handrails. Any raised decks and porches should have guardrails on the edges. Have any leaves, snow, or ice cleared regularly. Use sand or salt on walking paths during winter. Clean up any spills in your garage right away. This includes oil or grease spills. What can I do in the bathroom? Use night lights. Install grab bars by the toilet and in the tub and shower. Do not use towel bars as grab bars. Use non-skid mats or decals in the tub or  shower. If you need to sit down in the shower, use a plastic, non-slip stool. Keep the floor dry. Clean up any water that spills on the floor as soon as it happens. Remove soap buildup in the tub or shower regularly. Attach bath mats securely with double-sided non-slip rug tape. Do not have throw rugs and other things on the floor that can make you trip. What can I do in the bedroom? Use night lights. Make sure that you have a light by your bed that is easy to reach. Do not use any sheets or blankets that are too big for your bed. They should not hang down onto the floor. Have a firm chair that has side arms. You can use this for support while you get dressed. Do not have throw rugs and other things on the floor that can make you trip. What can I do in the kitchen? Clean up any spills right away. Avoid walking on wet floors. Keep items that you use a lot in easy-to-reach places. If you need to reach something above you, use a strong step stool that has a grab bar. Keep electrical cords out of the way. Do not use floor polish or wax that makes floors slippery. If you must use wax, use non-skid floor wax. Do not have throw rugs and other things on the floor that can make you trip. What can I do with my stairs? Do not leave any items on the stairs. Make sure that there are handrails on both sides of  the stairs and use them. Fix handrails that are broken or loose. Make sure that handrails are as long as the stairways. Check any carpeting to make sure that it is firmly attached to the stairs. Fix any carpet that is loose or worn. Avoid having throw rugs at the top or bottom of the stairs. If you do have throw rugs, attach them to the floor with carpet tape. Make sure that you have a light switch at the top of the stairs and the bottom of the stairs. If you do not have them, ask someone to add them for you. What else can I do to help prevent falls? Wear shoes that: Do not have high heels. Have  rubber bottoms. Are comfortable and fit you well. Are closed at the toe. Do not wear sandals. If you use a stepladder: Make sure that it is fully opened. Do not climb a closed stepladder. Make sure that both sides of the stepladder are locked into place. Ask someone to hold it for you, if possible. Clearly mark and make sure that you can see: Any grab bars or handrails. First and last steps. Where the edge of each step is. Use tools that help you move around (mobility aids) if they are needed. These include: Canes. Walkers. Scooters. Crutches. Turn on the lights when you go into a dark area. Replace any light bulbs as soon as they burn out. Set up your furniture so you have a clear path. Avoid moving your furniture around. If any of your floors are uneven, fix them. If there are any pets around you, be aware of where they are. Review your medicines with your doctor. Some medicines can make you feel dizzy. This can increase your chance of falling. Ask your doctor what other things that you can do to help prevent falls. This information is not intended to replace advice given to you by your health care provider. Make sure you discuss any questions you have with your health care provider. Document Released: 12/14/2008 Document Revised: 07/26/2015 Document Reviewed: 03/24/2014 Elsevier Interactive Patient Education  2017 [employer].

## 2021-02-01 ENCOUNTER — Other Ambulatory Visit: Payer: Self-pay | Admitting: Internal Medicine

## 2021-02-01 NOTE — Telephone Encounter (Signed)
Please refill as per office routine med refill policy (all routine meds to be refilled for 3 mo or monthly (per pt preference) up to one year from last visit, then month to month grace period for 3 mo, then further med refills will have to be denied) ? ?

## 2021-03-07 ENCOUNTER — Encounter: Payer: Self-pay | Admitting: Internal Medicine

## 2021-03-07 ENCOUNTER — Ambulatory Visit (INDEPENDENT_AMBULATORY_CARE_PROVIDER_SITE_OTHER): Payer: Medicare HMO | Admitting: Internal Medicine

## 2021-03-07 ENCOUNTER — Other Ambulatory Visit: Payer: Self-pay

## 2021-03-07 VITALS — BP 142/70 | HR 80 | Temp 97.7°F | Ht 66.0 in | Wt 167.0 lb

## 2021-03-07 DIAGNOSIS — E538 Deficiency of other specified B group vitamins: Secondary | ICD-10-CM

## 2021-03-07 DIAGNOSIS — E7800 Pure hypercholesterolemia, unspecified: Secondary | ICD-10-CM | POA: Diagnosis not present

## 2021-03-07 DIAGNOSIS — R739 Hyperglycemia, unspecified: Secondary | ICD-10-CM | POA: Diagnosis not present

## 2021-03-07 DIAGNOSIS — H9191 Unspecified hearing loss, right ear: Secondary | ICD-10-CM

## 2021-03-07 DIAGNOSIS — E559 Vitamin D deficiency, unspecified: Secondary | ICD-10-CM | POA: Diagnosis not present

## 2021-03-07 DIAGNOSIS — Z0001 Encounter for general adult medical examination with abnormal findings: Secondary | ICD-10-CM

## 2021-03-07 DIAGNOSIS — N1831 Chronic kidney disease, stage 3a: Secondary | ICD-10-CM

## 2021-03-07 DIAGNOSIS — I700 Atherosclerosis of aorta: Secondary | ICD-10-CM | POA: Diagnosis not present

## 2021-03-07 DIAGNOSIS — I10 Essential (primary) hypertension: Secondary | ICD-10-CM | POA: Diagnosis not present

## 2021-03-07 LAB — BASIC METABOLIC PANEL
BUN: 31 mg/dL — ABNORMAL HIGH (ref 6–23)
CO2: 27 mEq/L (ref 19–32)
Calcium: 9.4 mg/dL (ref 8.4–10.5)
Chloride: 103 mEq/L (ref 96–112)
Creatinine, Ser: 1.82 mg/dL — ABNORMAL HIGH (ref 0.40–1.50)
GFR: 35.73 mL/min — ABNORMAL LOW (ref >60.00)
Glucose, Bld: 89 mg/dL (ref 70–99)
Potassium: 4.2 mEq/L (ref 3.5–5.1)
Sodium: 137 mEq/L (ref 135–145)

## 2021-03-07 LAB — URINALYSIS, ROUTINE W REFLEX MICROSCOPIC
Bilirubin Urine: NEGATIVE
Hgb urine dipstick: NEGATIVE
Ketones, ur: NEGATIVE
Leukocytes,Ua: NEGATIVE
Nitrite: NEGATIVE
RBC / HPF: NONE SEEN (ref 0–?)
Specific Gravity, Urine: 1.01 (ref 1.000–1.030)
Total Protein, Urine: NEGATIVE
Urine Glucose: NEGATIVE
Urobilinogen, UA: 0.2 (ref 0.0–1.0)
pH: 6 (ref 5.0–8.0)

## 2021-03-07 LAB — LIPID PANEL
Cholesterol: 149 mg/dL (ref 0–200)
HDL: 45.8 mg/dL (ref >39.00)
LDL Cholesterol: 69 mg/dL (ref 0–99)
NonHDL: 103.51
Total CHOL/HDL Ratio: 3
Triglycerides: 172 mg/dL — ABNORMAL HIGH (ref 0.0–149.0)
VLDL: 34.4 mg/dL (ref 0.0–40.0)

## 2021-03-07 LAB — HEPATIC FUNCTION PANEL
ALT: 22 U/L (ref 0–53)
AST: 19 U/L (ref 0–37)
Albumin: 4.4 g/dL (ref 3.5–5.2)
Alkaline Phosphatase: 42 U/L (ref 39–117)
Bilirubin, Direct: 0.2 mg/dL (ref 0.0–0.3)
Total Bilirubin: 1 mg/dL (ref 0.2–1.2)
Total Protein: 7.1 g/dL (ref 6.0–8.3)

## 2021-03-07 LAB — CBC WITH DIFFERENTIAL/PLATELET
Basophils Absolute: 0.1 10*3/uL (ref 0.0–0.1)
Basophils Relative: 0.8 % (ref 0.0–3.0)
Eosinophils Absolute: 0.5 10*3/uL (ref 0.0–0.7)
Eosinophils Relative: 5.1 % — ABNORMAL HIGH (ref 0.0–5.0)
HCT: 43.8 % (ref 39.0–52.0)
Hemoglobin: 14.9 g/dL (ref 13.0–17.0)
Lymphocytes Relative: 17.4 % (ref 12.0–46.0)
Lymphs Abs: 1.8 10*3/uL (ref 0.7–4.0)
MCHC: 33.9 g/dL (ref 30.0–36.0)
MCV: 89.9 fl (ref 78.0–100.0)
Monocytes Absolute: 1.1 10*3/uL — ABNORMAL HIGH (ref 0.1–1.0)
Monocytes Relative: 10.2 % (ref 3.0–12.0)
Neutro Abs: 7.1 10*3/uL (ref 1.4–7.7)
Neutrophils Relative %: 66.5 % (ref 43.0–77.0)
Platelets: 181 10*3/uL (ref 150.0–400.0)
RBC: 4.88 Mil/uL (ref 4.22–5.81)
RDW: 14 % (ref 11.5–15.5)
WBC: 10.6 10*3/uL — ABNORMAL HIGH (ref 4.0–10.5)

## 2021-03-07 LAB — VITAMIN B12: Vitamin B-12: 481 pg/mL (ref 211–911)

## 2021-03-07 LAB — VITAMIN D 25 HYDROXY (VIT D DEFICIENCY, FRACTURES): VITD: 38.13 ng/mL (ref 30.00–100.00)

## 2021-03-07 LAB — TSH: TSH: 2.01 u[IU]/mL (ref 0.35–5.50)

## 2021-03-07 LAB — HEMOGLOBIN A1C: Hgb A1c MFr Bld: 6.1 % (ref 4.6–6.5)

## 2021-03-07 LAB — PSA: PSA: 0.54 ng/mL (ref 0.10–4.00)

## 2021-03-07 NOTE — Assessment & Plan Note (Signed)
Improved after stopped losartan per pt after seeing renal, no renal f/u felt needed, for f/u lab today

## 2021-03-07 NOTE — Progress Notes (Signed)
Patient consent obtained. Irrigation with water and peroxide performed on right ear. Full view of tympanic membranes after procedure.  Patient tolerated procedure well.

## 2021-03-07 NOTE — Patient Instructions (Signed)
Your right ear was cleared of wax today  Please continue all other medications as before, and refills have been done if requested.  Please have the pharmacy call with any other refills you may need.  Please continue your efforts at being more active, low cholesterol diet, and weight control.  You are otherwise up to date with prevention measures today.  Please keep your appointments with your specialists as you may have planned  Please go to the LAB at the blood drawing area for the tests to be done  You will be contacted by phone if any changes need to be made immediately.  Otherwise, you will receive a letter about your results with an explanation, but please check with MyChart first.  Please remember to sign up for MyChart if you have not done so, as this will be important to you in the future with finding out test results, communicating by private email, and scheduling acute appointments online when needed.  Please make an Appointment to return in 6 months, or sooner if needed, also with Lab Appointment for testing done 3-5 days before at the [facility] (so this is for TWO appointments - please see the scheduling desk as you leave)  Due to the ongoing Covid 19 pandemic, our lab now requires an appointment for any labs done at our office.  If you need labs done and do not have an appointment, please call our office ahead of time to schedule before presenting to the lab for your testing.

## 2021-03-07 NOTE — Progress Notes (Signed)
Patient ID: Jake Simon, male   DOB: 06-Jun-1945, 76 y.o.   MRN: [id number]         Chief Complaint:: wellness exam and Follow-up  Right hearing loss, hld, ckd, low vit d       HPI:  Jake Simon is a 76 y.o. male here for wellness exam; declines covid vax, o/w up to date                        Also not taking vit d.  C/o acute right hearing los in past 1 wk without HA, pain, fever, vertigo or tinnitus.  Trying to follow lower chol diet.  Pt denies chest pain, increased sob or doe, wheezing, orthopnea, PND, increased LE swelling, palpitations, dizziness or syncope.   Pt denies polydipsia, polyuria, or new focal neuro s/s.   Pt denies fever, wt loss, night sweats, loss of appetite, or other constitutional symptoms     Wt Readings from Last 3 Encounters:  03/07/21 167 lb (75.8 kg)  12/11/20 165 lb 9.6 oz (75.1 kg)  09/04/20 163 lb 6.4 oz (74.1 kg)   BP Readings from Last 3 Encounters:  03/07/21 (!) 142/70  12/11/20 136/80  09/04/20 (!) 146/68   Immunization History  Administered Date(s) Administered   Fluad Quad(high Dose 65+) 12/04/2018, 12/29/2019, 11/06/2020   H1N1 02/15/2008   Influenza Split 12/10/2010, 11/11/2011   Influenza Whole 01/05/2008, 12/12/2008, 12/11/2009   Influenza, High Dose Seasonal PF 12/08/2013, 11/22/2015, 11/25/2016, 12/31/2017   Influenza,inj,Quad PF,6+ Mos 11/23/2012, 11/16/2014   PFIZER(Purple Top)SARS-COV-2 Vaccination 04/10/2019, 05/05/2019, 01/16/2020, 11/27/2020   Pfizer Covid-19 Vaccine Bivalent Booster 88yrs & up 11/27/2020   Pneumococcal Conjugate-13 09/20/2013   Pneumococcal Polysaccharide-23 05/22/2009   Td 05/02/2008   Tdap 08/24/2018   Zoster Recombinat (Shingrix) 10/06/2016, 09/10/2017   Zoster, Live 08/20/2009   There are no preventive care reminders to display for this patient.     Past Medical History:  Diagnosis Date   Allergic rhinitis    Asthma    HTN (hypertension)    Hyperlipidemia    high triglycerides   Sleep apnea     moderate   Vision blurred    epithelial basement membrane dystrophy   Past Surgical History:  Procedure Laterality Date   COLONOSCOPY     dental implants     HERNIA REPAIR     POLYPECTOMY     TONSILLECTOMY      reports that he has never smoked. He has never used smokeless tobacco. He reports current alcohol use. He reports that he does not use drugs. family history includes Alzheimer's disease in his mother; Arthritis in his sister; Asthma in his father; Breast cancer in his mother; COPD in his father; Cancer in his sister; Mental illness in his mother; Stroke in his father. Allergies  Allergen Reactions   Losartan Other (See Comments)    CKD   Current Outpatient Medications on File Prior to Visit  Medication Sig Dispense Refill   albuterol (VENTOLIN HFA) 108 (90 Base) MCG/ACT inhaler Inhale 2 puffs into the lungs every 6 (six) hours as needed for wheezing or shortness of breath. 3 each 3   atorvastatin (LIPITOR) 80 MG tablet Take 1 tablet (80 mg total) by mouth daily. 90 tablet 3   diltiazem (CARDIZEM CD) 240 MG 24 hr capsule TAKE 1 CAPSULE EVERY DAY 90 capsule 3   ezetimibe (ZETIA) 10 MG tablet Take 1 tablet (10 mg total) by mouth daily. 90 tablet 2  fenofibrate 160 MG tablet TAKE 1 TABLET EVERY DAY 90 tablet 0   fluticasone (FLOVENT HFA) 110 MCG/ACT inhaler Inhale 2 puffs into the lungs 2 (two) times daily. 36 g 3   furosemide (LASIX) 20 MG tablet TAKE 1 TABLET EVERY DAY 90 tablet 3   nebivolol (BYSTOLIC) 2.5 MG tablet Take 1 tablet (2.5 mg total) by mouth daily. 90 tablet 3   sodium chloride (MURO 128) 5 % ophthalmic ointment Place 1 application into both eyes as needed for irritation.     No current facility-administered medications on file prior to visit.        ROS:  All others reviewed and negative.  Objective        PE:  BP (!) 142/70 (BP Location: Right Arm, Patient Position: Sitting, Cuff Size: Large)    Pulse 80    Temp 97.7 F (36.5 C) (Oral)    Ht 5\' 6"  (1.676  m)    Wt 167 lb (75.8 kg)    SpO2 97%    BMI 26.95 kg/m                 Constitutional: Pt appears in NAD               HENT: Head: NCAT.                Right Ear: External ear normal.                 Left Ear: External ear normal.                Eyes: . Pupils are equal, round, and reactive to light. Conjunctivae and EOM are normal               Nose: without d/c or deformity               Neck: Neck supple. Gross normal ROM               Cardiovascular: Normal rate and regular rhythm.                 Pulmonary/Chest: Effort normal and breath sounds without rales or wheezing.                Abd:  Soft, NT, ND, + BS, no organomegaly               Neurological: Pt is alert. At baseline orientation, motor grossly intact               Skin: Skin is warm. No rashes, no other new lesions, LE edema - none               Psychiatric: Pt behavior is normal without agitation   Micro: none  Cardiac tracings I have personally interpreted today:  none  Pertinent Radiological findings (summarize): none   Lab Results  Component Value Date   WBC 10.6 (H) 03/07/2021   HGB 14.9 03/07/2021   HCT 43.8 03/07/2021   PLT 181.0 03/07/2021   GLUCOSE 89 03/07/2021   CHOL 149 03/07/2021   TRIG 172.0 (H) 03/07/2021   HDL 45.80 03/07/2021   LDLDIRECT 106.0 09/04/2020   LDLCALC 69 03/07/2021   ALT 22 03/07/2021   AST 19 03/07/2021   NA 137 03/07/2021   K 4.2 03/07/2021   CL 103 03/07/2021   CREATININE 1.82 (H) 03/07/2021   BUN 31 (H) 03/07/2021   CO2 27 03/07/2021   TSH 2.01 03/07/2021  PSA 0.54 03/07/2021   HGBA1C 6.1 03/07/2021   Assessment/Plan:  Jake Simon is a 76 y.o. White or Caucasian [1] male with  has a past medical history of Allergic rhinitis, Asthma, HTN (hypertension), Hyperlipidemia, Sleep apnea, and Vision blurred.  Hyperlipidemia Pt states has hx of 2016 cardiac eval with no significant CAD per pt; declines lipid clinic referral for goal LDL < 70 given CKD - will work on  diet and continue lipitor/zetia  CKD (chronic kidney disease) stage 3, GFR 30-59 ml/min Improved after stopped losartan per pt after seeing renal, no renal f/u felt needed, for f/u lab today  Encounter for well adult exam with abnormal findings Age and sex appropriate education and counseling updated with regular exercise and diet Referrals for preventative services - none needed Immunizations addressed - declines covid booster Smoking counseling  - none needed Evidence for depression or other mood disorder - none significant Most recent labs reviewed. I have personally reviewed and have noted: 1) the patient's medical and social history 2) The patient's current medications and supplements 3) The patient's height, weight, and BMI have been recorded in the chart   Acute hearing loss of right ear Improved s/p irrigation wax impaction  Ceruminosis is noted.  Wax is removed by syringing and manual debridement. Instructions for home care to prevent wax buildup are given.   Aortic atherosclerosis ([place]) To continue lipitor 80, exercise, wt control, low chol diet  Hyperglycemia Lab Results  Component Value Date   HGBA1C 6.1 03/07/2021   Stable, pt to continue current medical treatment  - diet   Hypertension, uncontrolled BP Readings from Last 3 Encounters:  03/07/21 (!) 142/70  12/11/20 136/80  09/04/20 (!) 146/68   Mild elevated today, pt to continue medical treatment cardizem, bystolic as declines change   Vitamin D deficiency Last vitamin D Lab Results  Component Value Date   VD25OH 38.13 03/07/2021   Low, to start oral replacement  Followup: No follow-ups on file.  Cathlean Cower, MD 03/10/2021 7:24 PM [facility]

## 2021-03-07 NOTE — Assessment & Plan Note (Signed)
Pt states has hx of 2016 cardiac eval with no significant CAD per pt; declines lipid clinic referral for goal LDL < 70 given CKD - will work on diet and continue lipitor/zetia

## 2021-03-10 NOTE — Assessment & Plan Note (Signed)
To continue lipitor 80, exercise, wt control, low chol diet

## 2021-03-10 NOTE — Assessment & Plan Note (Signed)
Last vitamin D Lab Results  Component Value Date   VD25OH 38.13 03/07/2021   Low, to start oral replacement

## 2021-03-10 NOTE — Assessment & Plan Note (Signed)

## 2021-03-10 NOTE — Assessment & Plan Note (Signed)
Lab Results  Component Value Date   HGBA1C 6.1 03/07/2021   Stable, pt to continue current medical treatment  - diet

## 2021-03-10 NOTE — Assessment & Plan Note (Signed)
BP Readings from Last 3 Encounters:  03/07/21 (!) 142/70  12/11/20 136/80  09/04/20 (!) 146/68   Mild elevated today, pt to continue medical treatment cardizem, bystolic as declines change

## 2021-03-10 NOTE — Assessment & Plan Note (Signed)
Improved s/p irrigation wax impaction  Ceruminosis is noted.  Wax is removed by syringing and manual debridement. Instructions for home care to prevent wax buildup are given. 

## 2021-03-26 ENCOUNTER — Other Ambulatory Visit: Payer: Self-pay | Admitting: Internal Medicine

## 2021-03-26 NOTE — Telephone Encounter (Signed)
Please refill as per office routine med refill policy (all routine meds to be refilled for 3 mo or monthly (per pt preference) up to one year from last visit, then month to month grace period for 3 mo, then further med refills will have to be denied) ? ?

## 2021-04-03 DIAGNOSIS — Z85828 Personal history of other malignant neoplasm of skin: Secondary | ICD-10-CM | POA: Diagnosis not present

## 2021-04-03 DIAGNOSIS — D1801 Hemangioma of skin and subcutaneous tissue: Secondary | ICD-10-CM | POA: Diagnosis not present

## 2021-04-03 DIAGNOSIS — L570 Actinic keratosis: Secondary | ICD-10-CM | POA: Diagnosis not present

## 2021-04-03 DIAGNOSIS — L821 Other seborrheic keratosis: Secondary | ICD-10-CM | POA: Diagnosis not present

## 2021-05-27 ENCOUNTER — Other Ambulatory Visit: Payer: Self-pay

## 2021-05-27 ENCOUNTER — Encounter: Payer: Self-pay | Admitting: Internal Medicine

## 2021-05-27 ENCOUNTER — Ambulatory Visit (INDEPENDENT_AMBULATORY_CARE_PROVIDER_SITE_OTHER): Payer: Medicare HMO | Admitting: Internal Medicine

## 2021-05-27 DIAGNOSIS — J4530 Mild persistent asthma, uncomplicated: Secondary | ICD-10-CM

## 2021-05-27 MED ORDER — DOXYCYCLINE HYCLATE 100 MG PO TABS: 100.0000 mg | ORAL_TABLET | Freq: Two times a day (BID) | ORAL | 1 refills | Status: DC

## 2021-05-27 MED ORDER — PROMETHAZINE-DM 6.25-15 MG/5ML PO SYRP: 5.0000 mL | ORAL_SOLUTION | Freq: Four times a day (QID) | ORAL | 0 refills | Status: DC | PRN

## 2021-05-27 NOTE — Patient Instructions (Signed)
We have sent in the antibiotic doxycycline to take 1 pill twice a day for 1 week. ? ?We have also sent in a cough medicine to use as needed.  ?

## 2021-05-27 NOTE — Assessment & Plan Note (Addendum)
Concern for CAP given ongoing cough and fevers for more than a week. Rx doxycycline and promethazine/dm for cough. Taking flovent daily and albuterol prn. Has not needed albuterol and does not need prednisone treatment today. This is mild persistent given daily flovent for control. ?

## 2021-05-27 NOTE — Progress Notes (Signed)
? ?  Subjective:  ? ?Patient ID: Jake Simon, male    DOB: [birth year], 76 y.o.   MRN: [id number] ? ?HPI ?The patient is a 76 YO man coming in for cough and fevers.  ? ?Review of Systems  ?Constitutional:  Positive for activity change, appetite change, chills and fever. Negative for fatigue and unexpected weight change.  ?HENT:  Positive for congestion, postnasal drip, rhinorrhea and sinus pressure. Negative for ear discharge, ear pain, sinus pain, sneezing, sore throat, tinnitus, trouble swallowing and voice change.   ?Eyes: Negative.   ?Respiratory:  Positive for cough. Negative for chest tightness, shortness of breath and wheezing.   ?Cardiovascular: Negative.   ?Gastrointestinal: Negative.   ?Musculoskeletal:  Positive for myalgias.  ?Neurological: Negative.   ? ?Objective:  ?Physical Exam ?Constitutional:   ?   Appearance: He is well-developed.  ?HENT:  ?   Head: Normocephalic and atraumatic.  ?   Comments: Oropharynx with redness and clear drainage, nose with swollen turbinates, TMs normal bilaterally.  ?Neck:  ?   Thyroid: No thyromegaly.  ?Cardiovascular:  ?   Rate and Rhythm: Normal rate and regular rhythm.  ?Pulmonary:  ?   Effort: Pulmonary effort is normal. No respiratory distress.  ?   Breath sounds: Rhonchi present. No wheezing or rales.  ?Abdominal:  ?   Palpations: Abdomen is soft.  ?Musculoskeletal:     ?   General: Tenderness present.  ?   Cervical back: Normal range of motion.  ?Lymphadenopathy:  ?   Cervical: No cervical adenopathy.  ?Skin: ?   General: Skin is warm and dry.  ?Neurological:  ?   Mental Status: He is alert and oriented to person, place, and time.  ? ? ?Vitals:  ? 05/27/21 1001  ?BP: 130/78  ?Pulse: 80  ?Temp: 98.7 ?F (37.1 ?C)  ?TempSrc: Oral  ?SpO2: 97%  ?Weight: 164 lb 12.8 oz (74.8 kg)  ?Height: '5\' 6"'$  (1.676 m)  ? ? ?This visit occurred during the SARS-CoV-2 public health emergency.  Safety protocols were in place, including screening questions prior to the visit, additional  usage of staff PPE, and extensive cleaning of exam room while observing appropriate contact time as indicated for disinfecting solutions.  ? ?Assessment & Plan:  ? ?

## 2021-06-09 ENCOUNTER — Encounter: Payer: Self-pay | Admitting: Internal Medicine

## 2021-06-10 MED ORDER — AZITHROMYCIN 250 MG PO TABS: ORAL_TABLET | ORAL | 1 refills | Status: AC

## 2021-06-18 ENCOUNTER — Other Ambulatory Visit: Payer: Self-pay | Admitting: Internal Medicine

## 2021-06-18 NOTE — Telephone Encounter (Signed)
Please refill as per office routine med refill policy (all routine meds to be refilled for 3 mo or monthly (per pt preference) up to one year from last visit, then month to month grace period for 3 mo, then further med refills will have to be denied) ? ?

## 2021-09-10 ENCOUNTER — Ambulatory Visit: Payer: Medicare HMO | Admitting: Internal Medicine

## 2021-09-12 ENCOUNTER — Other Ambulatory Visit (INDEPENDENT_AMBULATORY_CARE_PROVIDER_SITE_OTHER): Payer: Medicare HMO

## 2021-09-12 DIAGNOSIS — E7800 Pure hypercholesterolemia, unspecified: Secondary | ICD-10-CM | POA: Diagnosis not present

## 2021-09-12 DIAGNOSIS — R739 Hyperglycemia, unspecified: Secondary | ICD-10-CM | POA: Diagnosis not present

## 2021-09-12 DIAGNOSIS — E559 Vitamin D deficiency, unspecified: Secondary | ICD-10-CM | POA: Diagnosis not present

## 2021-09-12 LAB — HEPATIC FUNCTION PANEL
ALT: 17 U/L (ref 0–53)
AST: 18 U/L (ref 0–37)
Albumin: 4.3 g/dL (ref 3.5–5.2)
Alkaline Phosphatase: 37 U/L — ABNORMAL LOW (ref 39–117)
Bilirubin, Direct: 0.2 mg/dL (ref 0.0–0.3)
Total Bilirubin: 1 mg/dL (ref 0.2–1.2)
Total Protein: 7.2 g/dL (ref 6.0–8.3)

## 2021-09-12 LAB — BASIC METABOLIC PANEL
BUN: 29 mg/dL — ABNORMAL HIGH (ref 6–23)
CO2: 24 mEq/L (ref 19–32)
Calcium: 9.1 mg/dL (ref 8.4–10.5)
Chloride: 105 mEq/L (ref 96–112)
Creatinine, Ser: 1.74 mg/dL — ABNORMAL HIGH (ref 0.40–1.50)
GFR: 37.57 mL/min — ABNORMAL LOW (ref >60.00)
Glucose, Bld: 101 mg/dL — ABNORMAL HIGH (ref 70–99)
Potassium: 3.6 mEq/L (ref 3.5–5.1)
Sodium: 138 mEq/L (ref 135–145)

## 2021-09-12 LAB — VITAMIN D 25 HYDROXY (VIT D DEFICIENCY, FRACTURES): VITD: 55.61 ng/mL (ref 30.00–100.00)

## 2021-09-12 LAB — LIPID PANEL
Cholesterol: 131 mg/dL (ref 0–200)
HDL: 39.1 mg/dL (ref >39.00)
LDL Cholesterol: 72 mg/dL (ref 0–99)
NonHDL: 91.65
Total CHOL/HDL Ratio: 3
Triglycerides: 98 mg/dL (ref 0.0–149.0)
VLDL: 19.6 mg/dL (ref 0.0–40.0)

## 2021-09-12 LAB — HEMOGLOBIN A1C: Hgb A1c MFr Bld: 6 % (ref 4.6–6.5)

## 2021-09-17 ENCOUNTER — Encounter: Payer: Self-pay | Admitting: Internal Medicine

## 2021-09-17 ENCOUNTER — Ambulatory Visit (INDEPENDENT_AMBULATORY_CARE_PROVIDER_SITE_OTHER): Payer: Medicare HMO | Admitting: Internal Medicine

## 2021-09-17 VITALS — BP 142/70 | HR 66 | Temp 98.4°F | Ht 66.0 in | Wt 160.2 lb

## 2021-09-17 DIAGNOSIS — Z125 Encounter for screening for malignant neoplasm of prostate: Secondary | ICD-10-CM | POA: Diagnosis not present

## 2021-09-17 DIAGNOSIS — R9431 Abnormal electrocardiogram [ECG] [EKG]: Secondary | ICD-10-CM | POA: Diagnosis not present

## 2021-09-17 DIAGNOSIS — E7800 Pure hypercholesterolemia, unspecified: Secondary | ICD-10-CM

## 2021-09-17 DIAGNOSIS — E538 Deficiency of other specified B group vitamins: Secondary | ICD-10-CM | POA: Diagnosis not present

## 2021-09-17 DIAGNOSIS — R739 Hyperglycemia, unspecified: Secondary | ICD-10-CM | POA: Diagnosis not present

## 2021-09-17 DIAGNOSIS — I10 Essential (primary) hypertension: Secondary | ICD-10-CM

## 2021-09-17 DIAGNOSIS — E559 Vitamin D deficiency, unspecified: Secondary | ICD-10-CM | POA: Diagnosis not present

## 2021-09-17 DIAGNOSIS — N1831 Chronic kidney disease, stage 3a: Secondary | ICD-10-CM | POA: Diagnosis not present

## 2021-09-17 NOTE — Assessment & Plan Note (Signed)
Last vitamin D Lab Results  Component Value Date   VD25OH 55.61 09/12/2021   Stable, cont oral replacement

## 2021-09-17 NOTE — Progress Notes (Signed)
Patient ID: Jake Simon, male   DOB: May 04, 1945, 76 y.o.   MRN: [id number]        Chief Complaint: follow up HTN, aortic atherosclerosis, low vit d, hld, ckd       HPI:  Jake Simon is a 76 y.o. male here overall doing ok, BP has been < 140/90 at home, Pt denies chest pain, increased sob or doe, wheezing, orthopnea, PND, increased LE swelling, palpitations, dizziness or syncope.   Pt denies polydipsia, polyuria, or new focal neuro s/s.    Pt denies fever, wt loss, night sweats, loss of appetite, or other constitutional symptoms  In light of risk factors, willing to have the Ct cardiac score.  Taking Vit D  Wt Readings from Last 3 Encounters:  09/17/21 160 lb 3.2 oz (72.7 kg)  05/27/21 164 lb 12.8 oz (74.8 kg)  03/07/21 167 lb (75.8 kg)   BP Readings from Last 3 Encounters:  09/17/21 (!) 142/70  05/27/21 130/78  03/07/21 (!) 142/70         Past Medical History:  Diagnosis Date   Allergic rhinitis    Asthma    HTN (hypertension)    Hyperlipidemia    high triglycerides   Sleep apnea    moderate   Vision blurred    epithelial basement membrane dystrophy   Past Surgical History:  Procedure Laterality Date   COLONOSCOPY     dental implants     HERNIA REPAIR     POLYPECTOMY     TONSILLECTOMY      reports that he has never smoked. He has never used smokeless tobacco. He reports current alcohol use. He reports that he does not use drugs. family history includes Alzheimer's disease in his mother; Arthritis in his sister; Asthma in his father; Breast cancer in his mother; COPD in his father; Cancer in his sister; Mental illness in his mother; Stroke in his father. Allergies  Allergen Reactions   Losartan Other (See Comments)    CKD   Current Outpatient Medications on File Prior to Visit  Medication Sig Dispense Refill   albuterol (VENTOLIN HFA) 108 (90 Base) MCG/ACT inhaler Inhale 2 puffs into the lungs every 6 (six) hours as needed for wheezing or shortness of breath. 3  each 3   atorvastatin (LIPITOR) 80 MG tablet TAKE 1 TABLET EVERY DAY 90 tablet 3   diltiazem (CARDIZEM CD) 240 MG 24 hr capsule TAKE 1 CAPSULE EVERY DAY 90 capsule 3   ezetimibe (ZETIA) 10 MG tablet TAKE 1 TABLET (10 MG TOTAL) BY MOUTH DAILY. 90 tablet 3   fenofibrate 160 MG tablet TAKE 1 TABLET EVERY DAY 90 tablet 3   fluticasone (FLOVENT HFA) 110 MCG/ACT inhaler Inhale 2 puffs into the lungs 2 (two) times daily. 36 g 3   furosemide (LASIX) 20 MG tablet TAKE 1 TABLET EVERY DAY 90 tablet 3   nebivolol (BYSTOLIC) 2.5 MG tablet Take 1 tablet (2.5 mg total) by mouth daily. 90 tablet 3   sodium chloride (MURO 128) 5 % ophthalmic ointment Place 1 application into both eyes as needed for irritation.     No current facility-administered medications on file prior to visit.        ROS:  All others reviewed and negative.  Objective        PE:  BP (!) 142/70 (BP Location: Left Arm, Patient Position: Sitting, Cuff Size: Large)   Pulse 66   Temp 98.4 F (36.9 C) (Oral)   Ht '5\' 6"'$  (  1.676 m)   Wt 160 lb 3.2 oz (72.7 kg)   SpO2 98%   BMI 25.86 kg/m                 Constitutional: Pt appears in NAD               HENT: Head: NCAT.                Right Ear: External ear normal.                 Left Ear: External ear normal.                Eyes: . Pupils are equal, round, and reactive to light. Conjunctivae and EOM are normal               Nose: without d/c or deformity               Neck: Neck supple. Gross normal ROM               Cardiovascular: Normal rate and regular rhythm.                 Pulmonary/Chest: Effort normal and breath sounds without rales or wheezing.                Abd:  Soft, NT, ND, + BS, no organomegaly               Neurological: Pt is alert. At baseline orientation, motor grossly intact               Skin: Skin is warm. No rashes, no other new lesions, LE edema - none               Psychiatric: Pt behavior is normal without agitation   Micro: none  Cardiac tracings I  have personally interpreted today:  none  Pertinent Radiological findings (summarize): none   Lab Results  Component Value Date   WBC 10.6 (H) 03/07/2021   HGB 14.9 03/07/2021   HCT 43.8 03/07/2021   PLT 181.0 03/07/2021   GLUCOSE 101 (H) 09/12/2021   CHOL 131 09/12/2021   TRIG 98.0 09/12/2021   HDL 39.10 09/12/2021   LDLDIRECT 106.0 09/04/2020   LDLCALC 72 09/12/2021   ALT 17 09/12/2021   AST 18 09/12/2021   NA 138 09/12/2021   K 3.6 09/12/2021   CL 105 09/12/2021   CREATININE 1.74 (H) 09/12/2021   BUN 29 (H) 09/12/2021   CO2 24 09/12/2021   TSH 2.01 03/07/2021   PSA 0.54 03/07/2021   HGBA1C 6.0 09/12/2021   Assessment/Plan:  Jake Simon is a 76 y.o. White or Caucasian [1] male with  has a past medical history of Allergic rhinitis, Asthma, HTN (hypertension), Hyperlipidemia, Sleep apnea, and Vision blurred.  Vitamin D deficiency Last vitamin D Lab Results  Component Value Date   VD25OH 55.61 09/12/2021   Stable, cont oral replacement   Hypertension, uncontrolled BP Readings from Last 3 Encounters:  09/17/21 (!) 142/70  05/27/21 130/78  03/07/21 (!) 142/70   Uncontrolled here, but pt states controlled at home, declines change in tx,  pt to continue medical treatment cardizem ct 240 qd    Hyperlipidemia Lab Results  Component Value Date   LDLCALC 72 09/12/2021   Uncontrolled, goal ldl < 70, declines change in tx for now, pt to continue current statin liptior 80, zetia 10 and fenofibrate 160 qd, and lower chol diet; also for  cardiac CT score  Hyperglycemia Lab Results  Component Value Date   HGBA1C 6.0 09/12/2021   Stable, pt to continue current medical treatment  - diet, wt control   CKD (chronic kidney disease) stage 3, GFR 30-59 ml/min Lab Results  Component Value Date   CREATININE 1.74 (H) 09/12/2021   Stable overall, cont to avoid nephrotoxins  Followup: Return in about 6 months (around 03/20/2022).  Cathlean Cower, MD 09/21/2021 1:51  PM [facility]

## 2021-09-17 NOTE — Patient Instructions (Signed)
Please continue all other medications as before, and refills have been done if requested.  Please have the pharmacy call with any other refills you may need.  Please continue your efforts at being more active, low cholesterol diet, and weight control.  Please keep your appointments with your specialists as you may have planned  You will be contacted regarding the referral for: Cardiac CT score  Please make an Appointment to return in 6 months, or sooner if needed, also with Lab Appointment for testing done 3-5 days before at the [facility] (so this is for TWO appointments - please see the scheduling desk as you leave)

## 2021-09-21 ENCOUNTER — Encounter: Payer: Self-pay | Admitting: Internal Medicine

## 2021-09-21 NOTE — Assessment & Plan Note (Signed)
Lab Results  Component Value Date   HGBA1C 6.0 09/12/2021   Stable, pt to continue current medical treatment  - diet, wt control

## 2021-09-21 NOTE — Assessment & Plan Note (Addendum)
Lab Results  Component Value Date   LDLCALC 72 09/12/2021   Uncontrolled, goal ldl < 70, declines change in tx for now, pt to continue current statin liptior 80, zetia 10 and fenofibrate 160 qd, and lower chol diet; also for cardiac CT score

## 2021-09-21 NOTE — Assessment & Plan Note (Signed)
Lab Results  Component Value Date   CREATININE 1.74 (H) 09/12/2021   Stable overall, cont to avoid nephrotoxins

## 2021-09-21 NOTE — Assessment & Plan Note (Signed)
BP Readings from Last 3 Encounters:  09/17/21 (!) 142/70  05/27/21 130/78  03/07/21 (!) 142/70   Uncontrolled here, but pt states controlled at home, declines change in tx,  pt to continue medical treatment cardizem ct 240 qd

## 2021-10-15 ENCOUNTER — Encounter (HOSPITAL_BASED_OUTPATIENT_CLINIC_OR_DEPARTMENT_OTHER): Payer: Self-pay

## 2021-10-15 ENCOUNTER — Ambulatory Visit (HOSPITAL_BASED_OUTPATIENT_CLINIC_OR_DEPARTMENT_OTHER)
Admission: RE | Admit: 2021-10-15 | Discharge: 2021-10-15 | Disposition: A | Discharge status: Home / Self Care | Payer: Medicare HMO | Source: Ambulatory Visit | Attending: Internal Medicine | Admitting: Internal Medicine

## 2021-10-15 ENCOUNTER — Other Ambulatory Visit: Payer: Self-pay | Admitting: Internal Medicine

## 2021-10-15 DIAGNOSIS — E7800 Pure hypercholesterolemia, unspecified: Secondary | ICD-10-CM | POA: Insufficient documentation

## 2021-10-15 DIAGNOSIS — I10 Essential (primary) hypertension: Secondary | ICD-10-CM | POA: Insufficient documentation

## 2021-10-15 DIAGNOSIS — R739 Hyperglycemia, unspecified: Secondary | ICD-10-CM | POA: Insufficient documentation

## 2021-10-15 DIAGNOSIS — R9431 Abnormal electrocardiogram [ECG] [EKG]: Secondary | ICD-10-CM | POA: Insufficient documentation

## 2021-10-15 MED ORDER — ASPIRIN 81 MG PO TBEC: 81.0000 mg | DELAYED_RELEASE_TABLET | Freq: Every day | ORAL | 12 refills | Status: AC

## 2021-10-20 ENCOUNTER — Other Ambulatory Visit: Payer: Self-pay | Admitting: Internal Medicine

## 2021-10-21 NOTE — Telephone Encounter (Signed)
Please refill as per office routine med refill policy (all routine meds to be refilled for 3 mo or monthly (per pt preference) up to one year from last visit, then month to month grace period for 3 mo, then further med refills will have to be denied) ? ?

## 2021-11-28 ENCOUNTER — Ambulatory Visit (INDEPENDENT_AMBULATORY_CARE_PROVIDER_SITE_OTHER): Payer: Medicare HMO

## 2021-11-28 DIAGNOSIS — Z23 Encounter for immunization: Secondary | ICD-10-CM

## 2021-11-28 NOTE — Progress Notes (Signed)
Pt received flu vaccine w/o complications.

## 2021-12-09 ENCOUNTER — Telehealth: Payer: Self-pay | Admitting: Internal Medicine

## 2021-12-09 NOTE — Telephone Encounter (Signed)
WCB to schedule Awv with nha

## 2021-12-20 ENCOUNTER — Ambulatory Visit: Payer: Medicare HMO

## 2021-12-21 ENCOUNTER — Encounter: Payer: Self-pay | Admitting: Internal Medicine

## 2021-12-24 MED ORDER — ARNUITY ELLIPTA 100 MCG/ACT IN AEPB
INHALATION_SPRAY | RESPIRATORY_TRACT | 11 refills | Status: DC
Start: 2021-12-24 — End: 2023-01-16

## 2021-12-25 ENCOUNTER — Other Ambulatory Visit (HOSPITAL_BASED_OUTPATIENT_CLINIC_OR_DEPARTMENT_OTHER): Payer: Self-pay

## 2021-12-25 MED ORDER — COMIRNATY 30 MCG/0.3ML IM SUSY
PREFILLED_SYRINGE | INTRAMUSCULAR | 0 refills | Status: DC
  Filled 2021-12-25: qty 0.3, 1d supply, fill #0

## 2021-12-27 ENCOUNTER — Ambulatory Visit (INDEPENDENT_AMBULATORY_CARE_PROVIDER_SITE_OTHER): Payer: Medicare HMO

## 2021-12-27 ENCOUNTER — Other Ambulatory Visit (HOSPITAL_BASED_OUTPATIENT_CLINIC_OR_DEPARTMENT_OTHER): Payer: Self-pay

## 2021-12-27 VITALS — Ht 66.0 in | Wt 160.0 lb

## 2021-12-27 DIAGNOSIS — Z0000 Encounter for general adult medical examination without abnormal findings: Secondary | ICD-10-CM | POA: Diagnosis not present

## 2021-12-27 NOTE — Progress Notes (Signed)
Subjective:   Jake Simon is a 76 y.o. male who presents for Medicare Annual/Subsequent preventive examination.   I connected with  Marisa Severin on 12/27/21 by a audio enabled telemedicine application and verified that I am speaking with the correct person using two identifiers.  Patient Location: Home  Provider Location: Home Office  I discussed the limitations of evaluation and management by telemedicine. The patient expressed understanding and agreed to proceed.  Review of Systems     Cardiac Risk Factors include: advanced age (>23mn, >>58women);hypertension;male gender     Objective:    Today's Vitals   12/27/21 0943  Weight: 160 lb (72.6 kg)  Height: '5\' 6"'$  (1.676 m)   Body mass index is 25.82 kg/m.     12/27/2021    9:50 AM 12/11/2020   11:55 AM 08/26/2019   10:19 AM 10/09/2017    3:40 PM 09/30/2016   11:27 AM 05/02/2015    1:44 PM 02/11/2015    2:46 PM  Advanced Directives  Does Patient Have a Medical Advance Directive? Yes Yes Yes Yes Yes Yes No  Type of [place]of [place] Living will;Healthcare Power of [place]    Does patient want to make changes to medical advance directive?  No - Patient declined No - Patient declined      Copy of [place] Chart? No - copy requested No - copy requested  Yes No - copy requested Yes     Current Medications (verified) Outpatient Encounter Medications as of 12/27/2021  Medication Sig   albuterol (VENTOLIN HFA) 108 (90 Base) MCG/ACT inhaler Inhale 2 puffs into the lungs every 6 (six) hours as needed for wheezing or shortness of breath.   aspirin EC 81 MG tablet Take 1 tablet (81 mg total) by mouth daily. Swallow whole.   atorvastatin (LIPITOR) 80 MG tablet TAKE 1 TABLET EVERY DAY   COVID-19 mRNA vaccine 2023-2024 (COMIRNATY) syringe Inject into the muscle.   diltiazem  (CARDIZEM CD) 240 MG 24 hr capsule TAKE 1 CAPSULE EVERY DAY   ezetimibe (ZETIA) 10 MG tablet TAKE 1 TABLET (10 MG TOTAL) BY MOUTH DAILY.   fenofibrate 160 MG tablet TAKE 1 TABLET EVERY DAY   FLOVENT HFA 110 MCG/ACT inhaler Inhale 2 puffs into the lungs 2 (two) times daily.   Fluticasone Furoate (ARNUITY ELLIPTA) 100 MCG/ACT AEPB 1 inhale once daily   furosemide (LASIX) 20 MG tablet TAKE 1 TABLET EVERY DAY   nebivolol (BYSTOLIC) 2.5 MG tablet Take 1 tablet (2.5 mg total) by mouth daily.   sodium chloride (MURO 128) 5 % ophthalmic ointment Place 1 application into both eyes as needed for irritation.   No facility-administered encounter medications on file as of 12/27/2021.    Allergies (verified) Losartan   History: Past Medical History:  Diagnosis Date   Allergic rhinitis    Asthma    HTN (hypertension)    Hyperlipidemia    high triglycerides   Sleep apnea    moderate   Vision blurred    epithelial basement membrane dystrophy   Past Surgical History:  Procedure Laterality Date   COLONOSCOPY     dental implants     HERNIA REPAIR     POLYPECTOMY     TONSILLECTOMY     Family History  Problem Relation Age of Onset   Alzheimer's disease Mother    Breast cancer Mother  Mental illness Mother        alzheimers   COPD Father    Asthma Father    Stroke Father    Cancer Sister        brain cancer   Arthritis Sister    Diabetes Neg Hx    Prostate cancer Neg Hx    Colon cancer Neg Hx    Heart disease Neg Hx    Hyperlipidemia Neg Hx    Rectal cancer Neg Hx    Stomach cancer Neg Hx    Social History   Socioeconomic History   Marital status: Married    Spouse name: Not on file   Number of children: 0   Years of education: 18   Highest education level: Not on file  Occupational History   Occupation: Center for [employer]    Comment: retired  Tobacco Use   Smoking status: Never   Smokeless tobacco: Never  Vaping Use   Vaping Use: Never used  Substance  and Sexual Activity   Alcohol use: Yes    Alcohol/week: 0.0 standard drinks of alcohol    Comment: occasional beer, wine   Drug use: No   Sexual activity: Not Currently  Other Topics Concern   Not on file  Social History Narrative     [place] [employer]; [place] @ [employer] for [employer].  Now retired Nurse, adult for [employer].  Has pet. Playing contract bridge. ACP - raised the issues and encouraged the appointment of a HCPOA and to consider detailed directive re: care.             Social Determinants of Health   Financial Resource Strain: Low Risk  (12/27/2021)   Overall Financial Resource Strain (CARDIA)    Difficulty of Paying Living Expenses: Not hard at all  Food Insecurity: No Food Insecurity (12/27/2021)   Hunger Vital Sign    Worried About Running Out of Food in the Last Year: Never true    Ran Out of Food in the Last Year: Never true  Transportation Needs: No Transportation Needs (12/27/2021)   PRAPARE - Hydrologist (Medical): No    Lack of Transportation (Non-Medical): No  Physical Activity: Sufficiently Active (12/27/2021)   Exercise Vital Sign    Days of Exercise per Week: 5 days    Minutes of Exercise per Session: 60 min  Stress: No Stress Concern Present (12/27/2021)   [place]    Feeling of Stress : Not at all  Social Connections: [place] (12/27/2021)   Social Connection and Isolation Panel [NHANES]    Frequency of Communication with Friends and Family: More than three times a week    Frequency of Social Gatherings with Friends and Family: More than three times a week    Attends Religious Services: More than 4 times per year    Active Member of [employer] or Organizations: Yes    Attends Music therapist: More than 4 times per year    Marital Status: Married    Tobacco Counseling Counseling given: Not Answered   Clinical Intake:                  Diabetic?no          Activities of Daily Living    12/27/2021    9:49 AM  In your present state of health, do you have any difficulty performing the following activities:  Hearing? 0  Vision? 0  Difficulty concentrating or making decisions? 0  Walking or climbing stairs? 0  Dressing or bathing? 0  Doing errands, shopping? 0  Preparing Food and eating ? N  Using the Toilet? N  In the past six months, have you accidently leaked urine? N  Do you have problems with loss of bowel control? N  Managing your Medications? N  Managing your Finances? N  Housekeeping or managing your Housekeeping? N    Patient Care Team: Biagio Borg, MD as PCP - General (Internal Medicine) Inda Castle, MD (Inactive) (Gastroenterology) Clent Jacks, MD (Ophthalmology) Carolan Clines, MD (Inactive) (Urology) Jarome Matin, MD as Consulting Physician (Dermatology)  Indicate any recent Medical Services you may have received from other than [facility] providers in the past year (date may be approximate).     Assessment:   This is a routine wellness examination for [place].  Hearing/Vision screen Vision Screening - Comments:: Annual eye exams wear glasses   Dietary issues and exercise activities discussed: Current Exercise Habits: Home exercise routine, Type of exercise: walking, Time (Minutes): 60, Frequency (Times/Week): 5, Weekly Exercise (Minutes/Week): 300, Intensity: Mild, Exercise limited by: None identified   Goals Addressed               This Visit's Progress     Client understands the importance of follow-up with providers by attending scheduled visits (pt-stated)   On track     Stay healthy, maintain weight and feel good.      Maintain current health status   On track     Continue to be healthy as possible by eating healthy, exercising, worshiping God, enjoy life and family       Depression Screen    12/27/2021    9:48 AM 09/17/2021    2:20 PM 03/07/2021    10:10 AM 12/11/2020   11:51 AM 09/04/2020    9:47 AM 03/06/2020   11:27 AM 08/26/2019   10:19 AM  PHQ 2/9 Scores  PHQ - 2 Score 0 0 0 0 0 0 0  PHQ- 9 Score  0         Fall Risk    12/27/2021    9:45 AM 09/17/2021    2:20 PM 03/07/2021   10:10 AM 12/11/2020   11:57 AM 09/04/2020    9:47 AM  Fall Risk   Falls in the past year? 0 0 0 0 0  Number falls in past yr: 0 0 0 0 0  Injury with Fall? 0 0 0 0 0  Risk for fall due to : No Fall Risks   No Fall Risks   Follow up Falls prevention discussed   Falls evaluation completed     FALL RISK PREVENTION PERTAINING TO THE HOME:  Any stairs in or around the home? Yes  If so, are there any without handrails? No  Home free of loose throw rugs in walkways, pet beds, electrical cords, etc? Yes  Adequate lighting in your home to reduce risk of falls? Yes   ASSISTIVE DEVICES UTILIZED TO PREVENT FALLS:  Life alert? No  Use of a cane, walker or w/c? No  Grab bars in the bathroom? Yes  Shower chair or bench in shower? Yes  Elevated toilet seat or a handicapped toilet? No        12/27/2021    9:50 AM 08/26/2019   10:34 AM  6CIT Screen  What Year? 0 points 0 points  What month? 0 points 0 points  What time? 0 points 0  points  Count back from 20 0 points 0 points  Months in reverse 0 points 0 points  Repeat phrase 0 points 0 points  Total Score 0 points 0 points    Immunizations Immunization History  Administered Date(s) Administered   COVID-19, mRNA, vaccine(Comirnaty)12 years and older 12/25/2021   Fluad Quad(high Dose 65+) 12/04/2018, 12/29/2019, 11/06/2020, 11/28/2021   H1N1 02/15/2008   Influenza Split 12/10/2010, 11/11/2011   Influenza Whole 01/05/2008, 12/12/2008, 12/11/2009   Influenza, High Dose Seasonal PF 12/08/2013, 11/22/2015, 11/25/2016, 12/31/2017   Influenza,inj,Quad PF,6+ Mos 11/23/2012, 11/16/2014   PFIZER(Purple Top)SARS-COV-2 Vaccination 04/10/2019, 05/05/2019, 01/16/2020, 11/27/2020   Pfizer Covid-19 Vaccine  Bivalent Booster 20yr & up 11/27/2020   Pneumococcal Conjugate-13 09/20/2013   Pneumococcal Polysaccharide-23 05/22/2009   Td 05/02/2008   Tdap 08/24/2018   Zoster Recombinat (Shingrix) 10/06/2016, 09/10/2017   Zoster, Live 08/20/2009    TDAP status: Up to date  Flu Vaccine status: Up to date  Pneumococcal vaccine status: Up to date  Covid-19 vaccine status: Completed vaccines  Qualifies for Shingles Vaccine? Yes   Zostavax completed Yes   Shingrix Completed?: Yes  Screening Tests Health Maintenance  Topic Date Due   Medicare Annual Wellness (AWV)  12/28/2022   TETANUS/TDAP  08/23/2028   INFLUENZA VACCINE  Completed   Hepatitis C Screening  Completed   Zoster Vaccines- Shingrix  Completed   HPV VACCINES  Aged Out   Pneumonia Vaccine 77 Years old  Discontinued   COLONOSCOPY (Pts 45-443yrInsurance coverage will need to be confirmed)  Discontinued   COVID-19 Vaccine  Discontinued    Health Maintenance  There are no preventive care reminders to display for this patient.   Colorectal cancer screening: No longer required.   Lung Cancer Screening: (Low Dose CT Chest recommended if Age 77-80ears, 30 pack-year currently smoking OR have quit w/in 15years.) does not qualify.   Lung Cancer Screening Referral: n/a  Additional Screening:  Hepatitis C Screening: does not qualify;   Vision Screening: Recommended annual ophthalmology exams for early detection of glaucoma and other disorders of the eye. Is the patient up to date with their annual eye exam?  Yes  Who is the provider or what is the name of the office in which the patient attends annual eye exams? Dr.Groat  If pt is not established with a provider, would they like to be referred to a provider to establish care? No .   Dental Screening: Recommended annual dental exams for proper oral hygiene  Community Resource Referral / Chronic Care Management: CRR required this visit?  No   CCM required this visit?  No       Plan:     I have personally reviewed and noted the following in the patient's chart:   Medical and social history Use of alcohol, tobacco or illicit drugs  Current medications and supplements including opioid prescriptions. Patient is not currently taking opioid prescriptions. Functional ability and status Nutritional status Physical activity Advanced directives List of other physicians Hospitalizations, surgeries, and ER visits in previous 12 months Vitals Screenings to include cognitive, depression, and falls Referrals and appointments  In addition, I have reviewed and discussed with patient certain preventive protocols, quality metrics, and best practice recommendations. A written personalized care plan for preventive services as well as general preventive health recommendations were provided to patient.     LaDaphane ShepherdLPN   [id number] Nurse Notes: none

## 2021-12-27 NOTE — Patient Instructions (Signed)
[name] , Thank you for taking time to come for your Medicare Wellness Visit. I appreciate your ongoing commitment to your health goals. Please review the following plan we discussed and let me know if I can assist you in the future.   These are the goals we discussed:  Goals       Client understands the importance of follow-up with providers by attending scheduled visits (pt-stated)      Stay healthy, maintain weight and feel good.      Exercise 150 minutes per week (moderate activity)      HDL 31 and goal at least 40; Will walk an add'l 30 minutes 3 days a week;       Maintain current health status      Continue to be healthy as possible by eating healthy, exercising, worshiping God, enjoy life and family      Patient Stated      I love to travel mainly by taking cruises. Enjoy life continue to play bridge.        This is a list of the screening recommended for you and due dates:  Health Maintenance  Topic Date Due   Medicare Annual Wellness Visit  12/28/2022   Tetanus Vaccine  08/23/2028   Flu Shot  Completed   Hepatitis C Screening: USPSTF Recommendation to screen - Ages 18-79 yo.  Completed   Zoster (Shingles) Vaccine  Completed   HPV Vaccine  Aged Out   Pneumonia Vaccine  Discontinued   Colon Cancer Screening  Discontinued   COVID-19 Vaccine  Discontinued    Advanced directives: Please bring a copy of your health care power of attorney and living will to the office to be added to your chart at your convenience.   Conditions/risks identified: Aim for 30 minutes of exercise or brisk walking, 6-8 glasses of water, and 5 servings of fruits and vegetables each day.   Next appointment: Follow up in one year for your annual wellness visit.   Preventive Care 53 Years and Older, Male  Preventive care refers to lifestyle choices and visits with your health care provider that can promote health and wellness. What does preventive care include? A yearly physical exam. This  is also called an annual well check. Dental exams once or twice a year. Routine eye exams. Ask your health care provider how often you should have your eyes checked. Personal lifestyle choices, including: Daily care of your teeth and gums. Regular physical activity. Eating a healthy diet. Avoiding tobacco and drug use. Limiting alcohol use. Practicing safe sex. Taking low doses of aspirin every day. Taking vitamin and mineral supplements as recommended by your health care provider. What happens during an annual well check? The services and screenings done by your health care provider during your annual well check will depend on your age, overall health, lifestyle risk factors, and family history of disease. Counseling  Your health care provider may ask you questions about your: Alcohol use. Tobacco use. Drug use. Emotional well-being. Home and relationship well-being. Sexual activity. Eating habits. History of falls. Memory and ability to understand (cognition). Work and work Statistician. Screening  You may have the following tests or measurements: Height, weight, and BMI. Blood pressure. Lipid and cholesterol levels. These may be checked every 5 years, or more frequently if you are over 44 years old. Skin check. Lung cancer screening. You may have this screening every year starting at age 78 if you have a 30-pack-year history of smoking and currently  smoke or have quit within the past 15 years. Fecal occult blood test (FOBT) of the stool. You may have this test every year starting at age 68. Flexible sigmoidoscopy or colonoscopy. You may have a sigmoidoscopy every 5 years or a colonoscopy every 10 years starting at age 3. Prostate cancer screening. Recommendations will vary depending on your family history and other risks. Hepatitis C blood test. Hepatitis B blood test. Sexually transmitted disease (STD) testing. Diabetes screening. This is done by checking your blood sugar  (glucose) after you have not eaten for a while (fasting). You may have this done every 1-3 years. Abdominal aortic aneurysm (AAA) screening. You may need this if you are a current or former smoker. Osteoporosis. You may be screened starting at age 37 if you are at high risk. Talk with your health care provider about your test results, treatment options, and if necessary, the need for more tests. Vaccines  Your health care provider may recommend certain vaccines, such as: Influenza vaccine. This is recommended every year. Tetanus, diphtheria, and acellular pertussis (Tdap, Td) vaccine. You may need a Td booster every 10 years. Zoster vaccine. You may need this after age 62. Pneumococcal 13-valent conjugate (PCV13) vaccine. One dose is recommended after age 32. Pneumococcal polysaccharide (PPSV23) vaccine. One dose is recommended after age 40. Talk to your health care provider about which screenings and vaccines you need and how often you need them. This information is not intended to replace advice given to you by your health care provider. Make sure you discuss any questions you have with your health care provider. Document Released: 03/16/2015 Document Revised: 11/07/2015 Document Reviewed: 12/19/2014 Elsevier Interactive Patient Education  2017 [place] Prevention in the Home Falls can cause injuries. They can happen to people of all ages. There are many things you can do to make your home safe and to help prevent falls. What can I do on the outside of my home? Regularly fix the edges of walkways and driveways and fix any cracks. Remove anything that might make you trip as you walk through a door, such as a raised step or threshold. Trim any bushes or trees on the path to your home. Use bright outdoor lighting. Clear any walking paths of anything that might make someone trip, such as rocks or tools. Regularly check to see if handrails are loose or broken. Make sure that both  sides of any steps have handrails. Any raised decks and porches should have guardrails on the edges. Have any leaves, snow, or ice cleared regularly. Use sand or salt on walking paths during winter. Clean up any spills in your garage right away. This includes oil or grease spills. What can I do in the bathroom? Use night lights. Install grab bars by the toilet and in the tub and shower. Do not use towel bars as grab bars. Use non-skid mats or decals in the tub or shower. If you need to sit down in the shower, use a plastic, non-slip stool. Keep the floor dry. Clean up any water that spills on the floor as soon as it happens. Remove soap buildup in the tub or shower regularly. Attach bath mats securely with double-sided non-slip rug tape. Do not have throw rugs and other things on the floor that can make you trip. What can I do in the bedroom? Use night lights. Make sure that you have a light by your bed that is easy to reach. Do not use any sheets or  blankets that are too big for your bed. They should not hang down onto the floor. Have a firm chair that has side arms. You can use this for support while you get dressed. Do not have throw rugs and other things on the floor that can make you trip. What can I do in the kitchen? Clean up any spills right away. Avoid walking on wet floors. Keep items that you use a lot in easy-to-reach places. If you need to reach something above you, use a strong step stool that has a grab bar. Keep electrical cords out of the way. Do not use floor polish or wax that makes floors slippery. If you must use wax, use non-skid floor wax. Do not have throw rugs and other things on the floor that can make you trip. What can I do with my stairs? Do not leave any items on the stairs. Make sure that there are handrails on both sides of the stairs and use them. Fix handrails that are broken or loose. Make sure that handrails are as long as the stairways. Check any  carpeting to make sure that it is firmly attached to the stairs. Fix any carpet that is loose or worn. Avoid having throw rugs at the top or bottom of the stairs. If you do have throw rugs, attach them to the floor with carpet tape. Make sure that you have a light switch at the top of the stairs and the bottom of the stairs. If you do not have them, ask someone to add them for you. What else can I do to help prevent falls? Wear shoes that: Do not have high heels. Have rubber bottoms. Are comfortable and fit you well. Are closed at the toe. Do not wear sandals. If you use a stepladder: Make sure that it is fully opened. Do not climb a closed stepladder. Make sure that both sides of the stepladder are locked into place. Ask someone to hold it for you, if possible. Clearly mark and make sure that you can see: Any grab bars or handrails. First and last steps. Where the edge of each step is. Use tools that help you move around (mobility aids) if they are needed. These include: Canes. Walkers. Scooters. Crutches. Turn on the lights when you go into a dark area. Replace any light bulbs as soon as they burn out. Set up your furniture so you have a clear path. Avoid moving your furniture around. If any of your floors are uneven, fix them. If there are any pets around you, be aware of where they are. Review your medicines with your doctor. Some medicines can make you feel dizzy. This can increase your chance of falling. Ask your doctor what other things that you can do to help prevent falls. This information is not intended to replace advice given to you by your health care provider. Make sure you discuss any questions you have with your health care provider. Document Released: 12/14/2008 Document Revised: 07/26/2015 Document Reviewed: 03/24/2014 Elsevier Interactive Patient Education  2017 [employer].

## 2021-12-30 ENCOUNTER — Other Ambulatory Visit (HOSPITAL_BASED_OUTPATIENT_CLINIC_OR_DEPARTMENT_OTHER): Payer: Self-pay

## 2022-01-10 ENCOUNTER — Other Ambulatory Visit (HOSPITAL_BASED_OUTPATIENT_CLINIC_OR_DEPARTMENT_OTHER): Payer: Self-pay

## 2022-02-19 ENCOUNTER — Encounter (HOSPITAL_BASED_OUTPATIENT_CLINIC_OR_DEPARTMENT_OTHER): Payer: Self-pay | Admitting: Pharmacist

## 2022-02-19 ENCOUNTER — Other Ambulatory Visit (HOSPITAL_BASED_OUTPATIENT_CLINIC_OR_DEPARTMENT_OTHER): Payer: Self-pay

## 2022-03-17 ENCOUNTER — Other Ambulatory Visit (INDEPENDENT_AMBULATORY_CARE_PROVIDER_SITE_OTHER): Payer: Medicare HMO

## 2022-03-17 DIAGNOSIS — R739 Hyperglycemia, unspecified: Secondary | ICD-10-CM | POA: Diagnosis not present

## 2022-03-17 DIAGNOSIS — Z125 Encounter for screening for malignant neoplasm of prostate: Secondary | ICD-10-CM

## 2022-03-17 DIAGNOSIS — E538 Deficiency of other specified B group vitamins: Secondary | ICD-10-CM

## 2022-03-17 DIAGNOSIS — E7800 Pure hypercholesterolemia, unspecified: Secondary | ICD-10-CM | POA: Diagnosis not present

## 2022-03-17 DIAGNOSIS — E559 Vitamin D deficiency, unspecified: Secondary | ICD-10-CM

## 2022-03-17 LAB — CBC WITH DIFFERENTIAL/PLATELET
Basophils Absolute: 0.1 10*3/uL (ref 0.0–0.1)
Basophils Relative: 1.1 % (ref 0.0–3.0)
Eosinophils Absolute: 0.4 10*3/uL (ref 0.0–0.7)
Eosinophils Relative: 5.2 % — ABNORMAL HIGH (ref 0.0–5.0)
HCT: 45.3 % (ref 39.0–52.0)
Hemoglobin: 15.7 g/dL (ref 13.0–17.0)
Lymphocytes Relative: 26.4 % (ref 12.0–46.0)
Lymphs Abs: 2.2 10*3/uL (ref 0.7–4.0)
MCHC: 34.6 g/dL (ref 30.0–36.0)
MCV: 90.2 fl (ref 78.0–100.0)
Monocytes Absolute: 0.8 10*3/uL (ref 0.1–1.0)
Monocytes Relative: 9.1 % (ref 3.0–12.0)
Neutro Abs: 5 10*3/uL (ref 1.4–7.7)
Neutrophils Relative %: 58.2 % (ref 43.0–77.0)
Platelets: 206 10*3/uL (ref 150.0–400.0)
RBC: 5.02 Mil/uL (ref 4.22–5.81)
RDW: 13.7 % (ref 11.5–15.5)
WBC: 8.5 10*3/uL (ref 4.0–10.5)

## 2022-03-17 LAB — BASIC METABOLIC PANEL
BUN: 34 mg/dL — ABNORMAL HIGH (ref 6–23)
CO2: 25 mEq/L (ref 19–32)
Calcium: 9 mg/dL (ref 8.4–10.5)
Chloride: 103 mEq/L (ref 96–112)
Creatinine, Ser: 2.21 mg/dL — ABNORMAL HIGH (ref 0.40–1.50)
GFR: 28.1 mL/min — ABNORMAL LOW (ref >60.00)
Glucose, Bld: 98 mg/dL (ref 70–99)
Potassium: 3.7 mEq/L (ref 3.5–5.1)
Sodium: 137 mEq/L (ref 135–145)

## 2022-03-17 LAB — LIPID PANEL
Cholesterol: 157 mg/dL (ref 0–200)
HDL: 41.2 mg/dL (ref >39.00)
LDL Cholesterol: 88 mg/dL (ref 0–99)
NonHDL: 115.32
Total CHOL/HDL Ratio: 4
Triglycerides: 138 mg/dL (ref 0.0–149.0)
VLDL: 27.6 mg/dL (ref 0.0–40.0)

## 2022-03-17 LAB — HEPATIC FUNCTION PANEL
ALT: 16 U/L (ref 0–53)
AST: 16 U/L (ref 0–37)
Albumin: 4.2 g/dL (ref 3.5–5.2)
Alkaline Phosphatase: 35 U/L — ABNORMAL LOW (ref 39–117)
Bilirubin, Direct: 0.2 mg/dL (ref 0.0–0.3)
Total Bilirubin: 1.2 mg/dL (ref 0.2–1.2)
Total Protein: 6.6 g/dL (ref 6.0–8.3)

## 2022-03-17 LAB — URINALYSIS, ROUTINE W REFLEX MICROSCOPIC
Bilirubin Urine: NEGATIVE
Hgb urine dipstick: NEGATIVE
Ketones, ur: NEGATIVE
Leukocytes,Ua: NEGATIVE
Nitrite: NEGATIVE
RBC / HPF: NONE SEEN (ref 0–?)
Specific Gravity, Urine: 1.02 (ref 1.000–1.030)
Total Protein, Urine: NEGATIVE
Urine Glucose: NEGATIVE
Urobilinogen, UA: 0.2 (ref 0.0–1.0)
pH: 6 (ref 5.0–8.0)

## 2022-03-17 LAB — HEMOGLOBIN A1C: Hgb A1c MFr Bld: 6.2 % (ref 4.6–6.5)

## 2022-03-17 LAB — TSH: TSH: 2.33 u[IU]/mL (ref 0.35–5.50)

## 2022-03-17 LAB — VITAMIN D 25 HYDROXY (VIT D DEFICIENCY, FRACTURES): VITD: 42.57 ng/mL (ref 30.00–100.00)

## 2022-03-17 LAB — VITAMIN B12: Vitamin B-12: 379 pg/mL (ref 211–911)

## 2022-03-17 LAB — PSA: PSA: 0.59 ng/mL (ref 0.10–4.00)

## 2022-03-19 ENCOUNTER — Other Ambulatory Visit: Payer: Self-pay | Admitting: Internal Medicine

## 2022-03-19 NOTE — Telephone Encounter (Signed)
Please refill as per office routine med refill policy (all routine meds to be refilled for 3 mo or monthly (per pt preference) up to one year from last visit, then month to month grace period for 3 mo, then further med refills will have to be denied) ? ?

## 2022-03-20 ENCOUNTER — Ambulatory Visit (INDEPENDENT_AMBULATORY_CARE_PROVIDER_SITE_OTHER): Payer: Medicare HMO | Admitting: Internal Medicine

## 2022-03-20 VITALS — BP 128/64 | HR 70 | Temp 97.9°F | Ht 66.0 in | Wt 165.0 lb

## 2022-03-20 DIAGNOSIS — R739 Hyperglycemia, unspecified: Secondary | ICD-10-CM

## 2022-03-20 DIAGNOSIS — Z0001 Encounter for general adult medical examination with abnormal findings: Secondary | ICD-10-CM

## 2022-03-20 DIAGNOSIS — E7800 Pure hypercholesterolemia, unspecified: Secondary | ICD-10-CM | POA: Diagnosis not present

## 2022-03-20 DIAGNOSIS — E559 Vitamin D deficiency, unspecified: Secondary | ICD-10-CM | POA: Diagnosis not present

## 2022-03-20 DIAGNOSIS — N1832 Chronic kidney disease, stage 3b: Secondary | ICD-10-CM

## 2022-03-20 DIAGNOSIS — N179 Acute kidney failure, unspecified: Secondary | ICD-10-CM

## 2022-03-20 MED ORDER — NEBIVOLOL HCL 2.5 MG PO TABS: 2.5000 mg | ORAL_TABLET | Freq: Every day | ORAL | 3 refills | Status: DC

## 2022-03-20 MED ORDER — AZITHROMYCIN 250 MG PO TABS: ORAL_TABLET | ORAL | 1 refills | Status: AC

## 2022-03-20 NOTE — Patient Instructions (Signed)
Please continue all other medications as before, and refills have been done if requested.  Please have the pharmacy call with any other refills you may need.  Please continue your efforts at being more active, lower cholesterol diet, and weight control.  You are otherwise up to date with prevention measures today.  Please keep your appointments with your specialists as you may have planned  You will be contacted regarding the referral for: Dr Lorrene Reid Renal  Please make an Appointment to return in 6 months, or sooner if needed, also with Lab Appointment for testing done 3-5 days before at the [facility] (so this is for TWO appointments - please see the scheduling desk as you leave)

## 2022-03-20 NOTE — Progress Notes (Signed)
Chief Complaint:: wellness exam and ckd 3b, hld, hyperglycemia, low vit d       HPI:  Jake Simon is a 77 y.o. male here for wellness exam; up to date                        Also Pt denies chest pain, increased sob or doe, wheezing, orthopnea, PND, increased LE swelling, palpitations, dizziness or syncope.   Pt denies polydipsia, polyuria, or new focal neuro s/s.    Pt denies fever, wt loss, night sweats, loss of appetite, or other constitutional symptoms  Has not seen renal in the past yr.   Wt Readings from Last 3 Encounters:  03/20/22 165 lb (74.8 kg)  12/27/21 160 lb (72.6 kg)  09/17/21 160 lb 3.2 oz (72.7 kg)   BP Readings from Last 3 Encounters:  03/20/22 128/64  09/17/21 (!) 142/70  05/27/21 130/78   Immunization History  Administered Date(s) Administered   COVID-19, mRNA, vaccine(Comirnaty)12 years and older 12/25/2021   Fluad Quad(high Dose 65+) 12/04/2018, 12/29/2019, 11/06/2020, 11/28/2021   H1N1 02/15/2008   Influenza Split 12/10/2010, 11/11/2011   Influenza Whole 01/05/2008, 12/12/2008, 12/11/2009   Influenza, High Dose Seasonal PF 12/08/2013, 11/22/2015, 11/25/2016, 12/31/2017   Influenza,inj,Quad PF,6+ Mos 11/23/2012, 11/16/2014   PFIZER(Purple Top)SARS-COV-2 Vaccination 04/10/2019, 05/05/2019, 01/16/2020   Pfizer Covid-19 Vaccine Bivalent Booster 62yr & up 11/27/2020   Pneumococcal Conjugate-13 09/20/2013   Pneumococcal Polysaccharide-23 05/22/2009   Td 05/02/2008   Tdap 08/24/2018   Zoster Recombinat (Shingrix) 10/06/2016, 09/10/2017   Zoster, Live 08/20/2009  There are no preventive care reminders to display for this patient.    Past Medical History:  Diagnosis Date   Allergic rhinitis    Asthma    HTN (hypertension)    Hyperlipidemia    high triglycerides   Sleep apnea    moderate   Vision blurred    epithelial basement membrane dystrophy   Past Surgical History:  Procedure Laterality Date   COLONOSCOPY     dental implants      HERNIA REPAIR     POLYPECTOMY     TONSILLECTOMY      reports that he has never smoked. He has never used smokeless tobacco. He reports current alcohol use. He reports that he does not use drugs. family history includes Alzheimer's disease in his mother; Arthritis in his sister; Asthma in his father; Breast cancer in his mother; COPD in his father; Cancer in his sister; Mental illness in his mother; Stroke in his father. Allergies  Allergen Reactions   Losartan Other (See Comments)    CKD   Current Outpatient Medications on File Prior to Visit  Medication Sig Dispense Refill   albuterol (VENTOLIN HFA) 108 (90 Base) MCG/ACT inhaler Inhale 2 puffs into the lungs every 6 (six) hours as needed for wheezing or shortness of breath. 3 each 3   aspirin EC 81 MG tablet Take 1 tablet (81 mg total) by mouth daily. Swallow whole. 30 tablet 12   atorvastatin (LIPITOR) 80 MG tablet TAKE 1 TABLET EVERY DAY 90 tablet 3   COVID-19 mRNA vaccine 2023-2024 (COMIRNATY) syringe Inject into the muscle. 0.3 mL 0   diltiazem (CARDIZEM CD) 240 MG 24 hr capsule TAKE 1 CAPSULE EVERY DAY 90 capsule 3   ezetimibe (ZETIA) 10 MG tablet TAKE 1 TABLET (10 MG TOTAL) BY MOUTH DAILY. 90 tablet 3   fenofibrate 160 MG tablet TAKE 1 TABLET EVERY  DAY 90 tablet 3   FLOVENT HFA 110 MCG/ACT inhaler Inhale 2 puffs into the lungs 2 (two) times daily. 36 g 3   Fluticasone Furoate (ARNUITY ELLIPTA) 100 MCG/ACT AEPB 1 inhale once daily 30 each 11   furosemide (LASIX) 20 MG tablet TAKE 1 TABLET EVERY DAY 90 tablet 3   sodium chloride (MURO 128) 5 % ophthalmic ointment Place 1 application into both eyes as needed for irritation.     No current facility-administered medications on file prior to visit.        ROS:  All others reviewed and negative.  Objective        PE:  BP 128/64 (BP Location: Right Arm, Patient Position: Sitting, Cuff Size: Large)   Pulse 70   Temp 97.9 F (36.6 C) (Oral)   Ht '5\' 6"'$  (1.676 m)   Wt 165 lb (74.8 kg)    SpO2 97%   BMI 26.63 kg/m                 Constitutional: Pt appears in NAD               HENT: Head: NCAT.                Right Ear: External ear normal.                 Left Ear: External ear normal.                Eyes: . Pupils are equal, round, and reactive to light. Conjunctivae and EOM are normal               Nose: without d/c or deformity               Neck: Neck supple. Gross normal ROM               Cardiovascular: Normal rate and regular rhythm.                 Pulmonary/Chest: Effort normal and breath sounds without rales or wheezing.                Abd:  Soft, NT, ND, + BS, no organomegaly               Neurological: Pt is alert. At baseline orientation, motor grossly intact               Skin: Skin is warm. No rashes, no other new lesions, LE edema - none               Psychiatric: Pt behavior is normal without agitation   Micro: none  Cardiac tracings I have personally interpreted today:  none  Pertinent Radiological findings (summarize): none   Lab Results  Component Value Date   WBC 8.5 03/17/2022   HGB 15.7 03/17/2022   HCT 45.3 03/17/2022   PLT 206.0 03/17/2022   GLUCOSE 98 03/17/2022   CHOL 157 03/17/2022   TRIG 138.0 03/17/2022   HDL 41.20 03/17/2022   LDLDIRECT 106.0 09/04/2020   LDLCALC 88 03/17/2022   ALT 16 03/17/2022   AST 16 03/17/2022   NA 137 03/17/2022   K 3.7 03/17/2022   CL 103 03/17/2022   CREATININE 2.21 (H) 03/17/2022   BUN 34 (H) 03/17/2022   CO2 25 03/17/2022   TSH 2.33 03/17/2022   PSA 0.59 03/17/2022   HGBA1C 6.2 03/17/2022   Assessment/Plan:  Jake Simon is a 77 y.o. White or  Caucasian [1] male with  has a past medical history of Allergic rhinitis, Asthma, HTN (hypertension), Hyperlipidemia, Sleep apnea, and Vision blurred.  Encounter for well adult exam with abnormal findings Age and sex appropriate education and counseling updated with regular exercise and diet Referrals for preventative services - none  needed Immunizations addressed - none needed Smoking counseling  - none needed Evidence for depression or other mood disorder - none significant Most recent labs reviewed. 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/min Lab Results  Component Value Date   CREATININE 2.21 (H) 03/17/2022   Midl worsening over baseline, for renal re-referral, cont to avoid nephrotoxins   Hyperglycemia Lab Results  Component Value Date   HGBA1C 6.2 03/17/2022   Stable, pt to continue current medical treatment  - diet, wt control   Hyperlipidemia Lab Results  Component Value Date   LDLCALC 88 03/17/2022   Uncontrolled, goal ldl < 70, pt to continue current statin lipitor 20 mg qd and lower chol diet as declines change for now   Vitamin D deficiency Last vitamin D Lab Results  Component Value Date   VD25OH 42.57 03/17/2022   Stable, cont oral replacement  Followup: Return in about 6 months (around 09/18/2022).  Cathlean Cower, MD 03/22/2022 4:37 PM [facility]

## 2022-03-22 ENCOUNTER — Encounter: Payer: Self-pay | Admitting: Internal Medicine

## 2022-03-22 NOTE — Assessment & Plan Note (Signed)
Lab Results  Component Value Date   HGBA1C 6.2 03/17/2022   Stable, pt to continue current medical treatment  - diet, wt control

## 2022-03-22 NOTE — Assessment & Plan Note (Signed)
Lab Results  Component Value Date   CREATININE 2.21 (H) 03/17/2022   Midl worsening over baseline, for renal re-referral, cont to avoid nephrotoxins

## 2022-03-22 NOTE — Assessment & Plan Note (Signed)
Lab Results  Component Value Date   LDLCALC 88 03/17/2022   Uncontrolled, goal ldl < 70, pt to continue current statin lipitor 20 mg qd and lower chol diet as declines change for now

## 2022-03-22 NOTE — Assessment & Plan Note (Signed)

## 2022-03-22 NOTE — Assessment & Plan Note (Signed)
Last vitamin D Lab Results  Component Value Date   VD25OH 42.57 03/17/2022   Stable, cont oral replacement

## 2022-04-04 ENCOUNTER — Other Ambulatory Visit: Payer: Self-pay | Admitting: Internal Medicine

## 2022-04-04 NOTE — Telephone Encounter (Signed)
Please refill as per office routine med refill policy (all routine meds to be refilled for 3 mo or monthly (per pt preference) up to one year from last visit, then month to month grace period for 3 mo, then further med refills will have to be denied) ? ?

## 2022-04-15 DIAGNOSIS — L821 Other seborrheic keratosis: Secondary | ICD-10-CM | POA: Diagnosis not present

## 2022-04-15 DIAGNOSIS — D2271 Melanocytic nevi of right lower limb, including hip: Secondary | ICD-10-CM | POA: Diagnosis not present

## 2022-04-15 DIAGNOSIS — D692 Other nonthrombocytopenic purpura: Secondary | ICD-10-CM | POA: Diagnosis not present

## 2022-04-15 DIAGNOSIS — Z85828 Personal history of other malignant neoplasm of skin: Secondary | ICD-10-CM | POA: Diagnosis not present

## 2022-05-15 DIAGNOSIS — N184 Chronic kidney disease, stage 4 (severe): Secondary | ICD-10-CM | POA: Diagnosis not present

## 2022-05-15 DIAGNOSIS — I129 Hypertensive chronic kidney disease with stage 1 through stage 4 chronic kidney disease, or unspecified chronic kidney disease: Secondary | ICD-10-CM | POA: Diagnosis not present

## 2022-05-17 LAB — LAB REPORT - SCANNED
Albumin, Urine POC: 45
Creatinine, POC: 187.7 mg/dL
EGFR: 38
Microalb Creat Ratio: 24

## 2022-06-11 DIAGNOSIS — H43813 Vitreous degeneration, bilateral: Secondary | ICD-10-CM | POA: Diagnosis not present

## 2022-06-11 DIAGNOSIS — H2513 Age-related nuclear cataract, bilateral: Secondary | ICD-10-CM | POA: Diagnosis not present

## 2022-06-11 DIAGNOSIS — H04123 Dry eye syndrome of bilateral lacrimal glands: Secondary | ICD-10-CM | POA: Diagnosis not present

## 2022-06-11 DIAGNOSIS — H18593 Other hereditary corneal dystrophies, bilateral: Secondary | ICD-10-CM | POA: Diagnosis not present

## 2022-06-11 DIAGNOSIS — D3131 Benign neoplasm of right choroid: Secondary | ICD-10-CM | POA: Diagnosis not present

## 2022-07-08 ENCOUNTER — Other Ambulatory Visit: Payer: Self-pay | Admitting: Internal Medicine

## 2022-07-11 ENCOUNTER — Other Ambulatory Visit: Payer: Self-pay

## 2022-09-15 ENCOUNTER — Other Ambulatory Visit (INDEPENDENT_AMBULATORY_CARE_PROVIDER_SITE_OTHER): Payer: Medicare HMO

## 2022-09-15 DIAGNOSIS — E7800 Pure hypercholesterolemia, unspecified: Secondary | ICD-10-CM

## 2022-09-15 DIAGNOSIS — R739 Hyperglycemia, unspecified: Secondary | ICD-10-CM | POA: Diagnosis not present

## 2022-09-15 DIAGNOSIS — E559 Vitamin D deficiency, unspecified: Secondary | ICD-10-CM

## 2022-09-15 LAB — BASIC METABOLIC PANEL
BUN: 25 mg/dL — ABNORMAL HIGH (ref 6–23)
CO2: 27 mEq/L (ref 19–32)
Calcium: 9.4 mg/dL (ref 8.4–10.5)
Chloride: 102 mEq/L (ref 96–112)
Creatinine, Ser: 1.8 mg/dL — ABNORMAL HIGH (ref 0.40–1.50)
GFR: 35.82 mL/min — ABNORMAL LOW (ref >60.00)
Glucose, Bld: 102 mg/dL — ABNORMAL HIGH (ref 70–99)
Potassium: 4 mEq/L (ref 3.5–5.1)
Sodium: 137 mEq/L (ref 135–145)

## 2022-09-15 LAB — HEPATIC FUNCTION PANEL
ALT: 17 U/L (ref 0–53)
AST: 18 U/L (ref 0–37)
Albumin: 4.3 g/dL (ref 3.5–5.2)
Alkaline Phosphatase: 37 U/L — ABNORMAL LOW (ref 39–117)
Bilirubin, Direct: 0.3 mg/dL (ref 0.0–0.3)
Total Bilirubin: 1.1 mg/dL (ref 0.2–1.2)
Total Protein: 7.2 g/dL (ref 6.0–8.3)

## 2022-09-15 LAB — LIPID PANEL
Cholesterol: 122 mg/dL (ref 0–200)
HDL: 40.7 mg/dL (ref >39.00)
LDL Cholesterol: 58 mg/dL (ref 0–99)
NonHDL: 81.51
Total CHOL/HDL Ratio: 3
Triglycerides: 116 mg/dL (ref 0.0–149.0)
VLDL: 23.2 mg/dL (ref 0.0–40.0)

## 2022-09-15 LAB — HEMOGLOBIN A1C: Hgb A1c MFr Bld: 6.1 % (ref 4.6–6.5)

## 2022-09-15 LAB — VITAMIN D 25 HYDROXY (VIT D DEFICIENCY, FRACTURES): VITD: 51.54 ng/mL (ref 30.00–100.00)

## 2022-09-18 ENCOUNTER — Ambulatory Visit (INDEPENDENT_AMBULATORY_CARE_PROVIDER_SITE_OTHER): Payer: Medicare HMO | Admitting: Internal Medicine

## 2022-09-18 ENCOUNTER — Encounter: Payer: Self-pay | Admitting: Internal Medicine

## 2022-09-18 VITALS — BP 126/80 | HR 75 | Temp 98.5°F | Ht 66.0 in | Wt 163.0 lb

## 2022-09-18 DIAGNOSIS — N1831 Chronic kidney disease, stage 3a: Secondary | ICD-10-CM

## 2022-09-18 DIAGNOSIS — E559 Vitamin D deficiency, unspecified: Secondary | ICD-10-CM | POA: Diagnosis not present

## 2022-09-18 DIAGNOSIS — E7800 Pure hypercholesterolemia, unspecified: Secondary | ICD-10-CM | POA: Diagnosis not present

## 2022-09-18 DIAGNOSIS — R739 Hyperglycemia, unspecified: Secondary | ICD-10-CM | POA: Diagnosis not present

## 2022-09-18 DIAGNOSIS — I10 Essential (primary) hypertension: Secondary | ICD-10-CM | POA: Diagnosis not present

## 2022-09-18 DIAGNOSIS — H6123 Impacted cerumen, bilateral: Secondary | ICD-10-CM | POA: Diagnosis not present

## 2022-09-18 NOTE — Progress Notes (Unsigned)
Patient ID: Jake Simon, male   DOB: June 26, 1945, 77 y.o.   MRN: [id number]        Chief Complaint: follow up bilateral ear wax, ckd3b,        HPI:  Jake Simon is a 77 y.o. male here with c/o        Wt Readings from Last 3 Encounters:  09/18/22 163 lb (73.9 kg)  03/20/22 165 lb (74.8 kg)  12/27/21 160 lb (72.6 kg)   BP Readings from Last 3 Encounters:  09/18/22 126/80  03/20/22 128/64  09/17/21 (!) 142/70         Past Medical History:  Diagnosis Date   Allergic rhinitis    Asthma    HTN (hypertension)    Hyperlipidemia    high triglycerides   Sleep apnea    moderate   Vision blurred    epithelial basement membrane dystrophy   Past Surgical History:  Procedure Laterality Date   COLONOSCOPY     dental implants     HERNIA REPAIR     POLYPECTOMY     TONSILLECTOMY      reports that he has never smoked. He has never used smokeless tobacco. He reports current alcohol use. He reports that he does not use drugs. family history includes Alzheimer's disease in his mother; Arthritis in his sister; Asthma in his father; Breast cancer in his mother; COPD in his father; Cancer in his sister; Mental illness in his mother; Stroke in his father. Allergies  Allergen Reactions   Losartan Other (See Comments)    CKD   Current Outpatient Medications on File Prior to Visit  Medication Sig Dispense Refill   albuterol (VENTOLIN HFA) 108 (90 Base) MCG/ACT inhaler Inhale 2 puffs into the lungs every 6 (six) hours as needed for wheezing or shortness of breath. 3 each 3   aspirin EC 81 MG tablet Take 1 tablet (81 mg total) by mouth daily. Swallow whole. 30 tablet 12   atorvastatin (LIPITOR) 80 MG tablet TAKE 1 TABLET EVERY DAY 90 tablet 3   COVID-19 mRNA vaccine 2023-2024 (COMIRNATY) syringe Inject into the muscle. 0.3 mL 0   diltiazem (CARDIZEM CD) 240 MG 24 hr capsule TAKE 1 CAPSULE EVERY DAY 90 capsule 3   ezetimibe (ZETIA) 10 MG tablet TAKE 1 TABLET EVERY DAY 90 tablet 3    fenofibrate 160 MG tablet TAKE 1 TABLET EVERY DAY 90 tablet 3   FLOVENT HFA 110 MCG/ACT inhaler Inhale 2 puffs into the lungs 2 (two) times daily. 36 g 3   Fluticasone Furoate (ARNUITY ELLIPTA) 100 MCG/ACT AEPB 1 inhale once daily 30 each 11   furosemide (LASIX) 20 MG tablet TAKE 1 TABLET EVERY DAY 90 tablet 3   nebivolol (BYSTOLIC) 2.5 MG tablet Take 1 tablet (2.5 mg total) by mouth daily. 90 tablet 3   sodium chloride (MURO 128) 5 % ophthalmic ointment Place 1 application into both eyes as needed for irritation.     No current facility-administered medications on file prior to visit.        ROS:  All others reviewed and negative.  Objective        PE:  BP 126/80 (BP Location: Right Arm, Patient Position: Sitting, Cuff Size: Normal)   Pulse 75   Temp 98.5 F (36.9 C) (Oral)   Ht 5\' 6"  (1.676 m)   Wt 163 lb (73.9 kg)   SpO2 98%   BMI 26.31 kg/m  Constitutional: Pt appears in NAD               HENT: Head: NCAT.                Right Ear: External ear normal.                 Left Ear: External ear normal.                Eyes: . Pupils are equal, round, and reactive to light. Conjunctivae and EOM are normal               Nose: without d/c or deformity               Neck: Neck supple. Gross normal ROM               Cardiovascular: Normal rate and regular rhythm.                 Pulmonary/Chest: Effort normal and breath sounds without rales or wheezing.                Abd:  Soft, NT, ND, + BS, no organomegaly               Neurological: Pt is alert. At baseline orientation, motor grossly intact               Skin: Skin is warm. No rashes, no other new lesions, LE edema - none               Psychiatric: Pt behavior is normal without agitation   Micro: none  Cardiac tracings I have personally interpreted today:  none  Pertinent Radiological findings (summarize): none   Lab Results  Component Value Date   WBC 8.5 03/17/2022   HGB 15.7 03/17/2022   HCT 45.3  03/17/2022   PLT 206.0 03/17/2022   GLUCOSE 102 (H) 09/15/2022   CHOL 122 09/15/2022   TRIG 116.0 09/15/2022   HDL 40.70 09/15/2022   LDLDIRECT 106.0 09/04/2020   LDLCALC 58 09/15/2022   ALT 17 09/15/2022   AST 18 09/15/2022   NA 137 09/15/2022   K 4.0 09/15/2022   CL 102 09/15/2022   CREATININE 1.80 (H) 09/15/2022   BUN 25 (H) 09/15/2022   CO2 27 09/15/2022   TSH 2.33 03/17/2022   PSA 0.59 03/17/2022   HGBA1C 6.1 09/15/2022   Assessment/Plan:  Jake Simon is a 77 y.o. White or Caucasian [1] male with  has a past medical history of Allergic rhinitis, Asthma, HTN (hypertension), Hyperlipidemia, Sleep apnea, and Vision blurred.  Bilateral impacted cerumen Improved s/p irrigation - Ceruminosis is noted.  Wax is removed by syringing and manual debridement. Instructions for home care to prevent wax buildup are given.   Vitamin D deficiency Last vitamin D Lab Results  Component Value Date   VD25OH 51.54 09/15/2022   Stable, cont oral replacement   Hypertension, uncontrolled BP Readings from Last 3 Encounters:  09/18/22 126/80  03/20/22 128/64  09/17/21 (!) 142/70   Stable, pt to continue medical treatment card cd 240 every day, bystolic 2.5 qd   Hyperlipidemia Lab Results  Component Value Date   LDLCALC 58 09/15/2022   Stable, pt to continue current statin lipitor80 every day, zetia 10 qd   Hyperglycemia Lab Results  Component Value Date   HGBA1C 6.1 09/15/2022   Stable, pt to continue current medical treatment  - diet,w t control   CKD (chronic kidney disease)  stage 3, GFR 30-59 ml/min Lab Results  Component Value Date   CREATININE 1.80 (H) 09/15/2022   Stable overall, cont to avoid nephrotoxins, f/u renal as planned Dr Marisue Humble  Followup: Return in about 6 months (around 03/21/2023).  Oliver Barre, MD 09/20/2022 8:49 PM [facility]

## 2022-09-18 NOTE — Patient Instructions (Signed)
Your ears were irrigated of wax today  Please continue all other medications as before, and refills have been done if requested.  Please have the pharmacy call with any other refills you may need.  Please continue your efforts at being more active, low cholesterol diet, and weight control.  Please keep your appointments with your specialists as you may have planned - Dr Marisue Humble - renal as planned  Your lab work was somewhat improved!  Please make an Appointment to return in 6 months, or sooner if needed, also with Lab Appointment for testing done 3-5 days before at the FIRST FLOOR Lab (so this is for TWO appointments - please see the scheduling desk as you leave)

## 2022-09-20 ENCOUNTER — Encounter: Payer: Self-pay | Admitting: Internal Medicine

## 2022-09-20 DIAGNOSIS — H6123 Impacted cerumen, bilateral: Secondary | ICD-10-CM | POA: Insufficient documentation

## 2022-09-20 NOTE — Assessment & Plan Note (Signed)
Lab Results  Component Value Date   HGBA1C 6.1 09/15/2022   Stable, pt to continue current medical treatment  - diet,w t control

## 2022-09-20 NOTE — Assessment & Plan Note (Signed)
BP Readings from Last 3 Encounters:  09/18/22 126/80  03/20/22 128/64  09/17/21 (!) 142/70   Stable, pt to continue medical treatment card cd 240 every day, bystolic 2.5 qd

## 2022-09-20 NOTE — Assessment & Plan Note (Signed)
Last vitamin D Lab Results  Component Value Date   VD25OH 51.54 09/15/2022   Stable, cont oral replacement

## 2022-09-20 NOTE — Assessment & Plan Note (Signed)
Improved s/p irrigation  Ceruminosis is noted.  Wax is removed by syringing and manual debridement. Instructions for home care to prevent wax buildup are given.  

## 2022-09-20 NOTE — Assessment & Plan Note (Signed)
Lab Results  Component Value Date   CREATININE 1.80 (H) 09/15/2022   Stable overall, cont to avoid nephrotoxins, f/u renal as planned Dr Marisue Humble

## 2022-09-20 NOTE — Assessment & Plan Note (Signed)
Lab Results  Component Value Date   LDLCALC 58 09/15/2022   Stable, pt to continue current statin lipitor80 every day, zetia 10 qd

## 2022-12-11 DIAGNOSIS — N184 Chronic kidney disease, stage 4 (severe): Secondary | ICD-10-CM | POA: Diagnosis not present

## 2022-12-15 DIAGNOSIS — I129 Hypertensive chronic kidney disease with stage 1 through stage 4 chronic kidney disease, or unspecified chronic kidney disease: Secondary | ICD-10-CM | POA: Diagnosis not present

## 2022-12-15 DIAGNOSIS — N184 Chronic kidney disease, stage 4 (severe): Secondary | ICD-10-CM | POA: Diagnosis not present

## 2023-01-16 ENCOUNTER — Other Ambulatory Visit: Payer: Self-pay

## 2023-01-16 ENCOUNTER — Other Ambulatory Visit: Payer: Self-pay | Admitting: Internal Medicine

## 2023-03-18 ENCOUNTER — Telehealth: Payer: Self-pay | Admitting: Internal Medicine

## 2023-03-18 DIAGNOSIS — E7800 Pure hypercholesterolemia, unspecified: Secondary | ICD-10-CM

## 2023-03-18 DIAGNOSIS — R739 Hyperglycemia, unspecified: Secondary | ICD-10-CM

## 2023-03-18 DIAGNOSIS — E559 Vitamin D deficiency, unspecified: Secondary | ICD-10-CM

## 2023-03-18 DIAGNOSIS — Z125 Encounter for screening for malignant neoplasm of prostate: Secondary | ICD-10-CM

## 2023-03-18 NOTE — Telephone Encounter (Signed)
 Called and let Pt know

## 2023-03-18 NOTE — Addendum Note (Signed)
 Addended by: Roslyn Coombe on: 03/18/2023 08:49 AM   Modules accepted: Orders

## 2023-03-18 NOTE — Telephone Encounter (Signed)
 Patient would like labs to be ordered prior to his appointment on 03/23/23. He was told to come in ahead of time for these, but they were not ordered. Patient would like a call back at [phone].

## 2023-03-18 NOTE — Telephone Encounter (Signed)
Ok this can be done

## 2023-03-19 ENCOUNTER — Other Ambulatory Visit: Payer: Medicare HMO

## 2023-03-19 DIAGNOSIS — E559 Vitamin D deficiency, unspecified: Secondary | ICD-10-CM | POA: Diagnosis not present

## 2023-03-19 DIAGNOSIS — E7800 Pure hypercholesterolemia, unspecified: Secondary | ICD-10-CM

## 2023-03-19 DIAGNOSIS — R739 Hyperglycemia, unspecified: Secondary | ICD-10-CM | POA: Diagnosis not present

## 2023-03-19 DIAGNOSIS — Z125 Encounter for screening for malignant neoplasm of prostate: Secondary | ICD-10-CM | POA: Diagnosis not present

## 2023-03-19 LAB — TSH: TSH: 2.4 u[IU]/mL (ref 0.35–5.50)

## 2023-03-19 LAB — BASIC METABOLIC PANEL
BUN: 32 mg/dL — ABNORMAL HIGH (ref 6–23)
CO2: 24 meq/L (ref 19–32)
Calcium: 9.1 mg/dL (ref 8.4–10.5)
Chloride: 105 meq/L (ref 96–112)
Creatinine, Ser: 1.73 mg/dL — ABNORMAL HIGH (ref 0.40–1.50)
GFR: 37.43 mL/min — ABNORMAL LOW (ref >60.00)
Glucose, Bld: 107 mg/dL — ABNORMAL HIGH (ref 70–99)
Potassium: 3.9 meq/L (ref 3.5–5.1)
Sodium: 139 meq/L (ref 135–145)

## 2023-03-19 LAB — URINALYSIS, ROUTINE W REFLEX MICROSCOPIC
Bilirubin Urine: NEGATIVE
Hgb urine dipstick: NEGATIVE
Ketones, ur: NEGATIVE
Leukocytes,Ua: NEGATIVE
Nitrite: NEGATIVE
RBC / HPF: NONE SEEN (ref 0–?)
Specific Gravity, Urine: 1.025 (ref 1.000–1.030)
Total Protein, Urine: 30 — AB
Urine Glucose: NEGATIVE
Urobilinogen, UA: 0.2 (ref 0.0–1.0)
pH: 6 (ref 5.0–8.0)

## 2023-03-19 LAB — LIPID PANEL
Cholesterol: 144 mg/dL (ref 0–200)
HDL: 46.4 mg/dL (ref >39.00)
LDL Cholesterol: 82 mg/dL (ref 0–99)
NonHDL: 97.72
Total CHOL/HDL Ratio: 3
Triglycerides: 80 mg/dL (ref 0.0–149.0)
VLDL: 16 mg/dL (ref 0.0–40.0)

## 2023-03-19 LAB — HEPATIC FUNCTION PANEL
ALT: 17 U/L (ref 0–53)
AST: 18 U/L (ref 0–37)
Albumin: 4.5 g/dL (ref 3.5–5.2)
Alkaline Phosphatase: 37 U/L — ABNORMAL LOW (ref 39–117)
Bilirubin, Direct: 0.2 mg/dL (ref 0.0–0.3)
Total Bilirubin: 1 mg/dL (ref 0.2–1.2)
Total Protein: 7 g/dL (ref 6.0–8.3)

## 2023-03-19 LAB — CBC WITH DIFFERENTIAL/PLATELET
Basophils Absolute: 0.1 10*3/uL (ref 0.0–0.1)
Basophils Relative: 1.1 % (ref 0.0–3.0)
Eosinophils Absolute: 0.5 10*3/uL (ref 0.0–0.7)
Eosinophils Relative: 5.1 % — ABNORMAL HIGH (ref 0.0–5.0)
HCT: 44.9 % (ref 39.0–52.0)
Hemoglobin: 15.3 g/dL (ref 13.0–17.0)
Lymphocytes Relative: 21.3 % (ref 12.0–46.0)
Lymphs Abs: 1.9 10*3/uL (ref 0.7–4.0)
MCHC: 33.9 g/dL (ref 30.0–36.0)
MCV: 92.1 fL (ref 78.0–100.0)
Monocytes Absolute: 0.9 10*3/uL (ref 0.1–1.0)
Monocytes Relative: 10.2 % (ref 3.0–12.0)
Neutro Abs: 5.6 10*3/uL (ref 1.4–7.7)
Neutrophils Relative %: 62.3 % (ref 43.0–77.0)
Platelets: 214 10*3/uL (ref 150.0–400.0)
RBC: 4.88 Mil/uL (ref 4.22–5.81)
RDW: 13.9 % (ref 11.5–15.5)
WBC: 9 10*3/uL (ref 4.0–10.5)

## 2023-03-19 LAB — VITAMIN D 25 HYDROXY (VIT D DEFICIENCY, FRACTURES): VITD: 52.42 ng/mL (ref 30.00–100.00)

## 2023-03-19 LAB — HEMOGLOBIN A1C: Hgb A1c MFr Bld: 6.2 % (ref 4.6–6.5)

## 2023-03-19 LAB — PSA: PSA: 0.78 ng/mL (ref 0.10–4.00)

## 2023-03-23 ENCOUNTER — Encounter: Payer: Self-pay | Admitting: Internal Medicine

## 2023-03-23 ENCOUNTER — Ambulatory Visit (INDEPENDENT_AMBULATORY_CARE_PROVIDER_SITE_OTHER): Payer: Medicare HMO | Admitting: Internal Medicine

## 2023-03-23 VITALS — BP 124/72 | HR 75 | Temp 98.7°F | Ht 66.0 in | Wt 163.0 lb

## 2023-03-23 DIAGNOSIS — I10 Essential (primary) hypertension: Secondary | ICD-10-CM | POA: Diagnosis not present

## 2023-03-23 DIAGNOSIS — E559 Vitamin D deficiency, unspecified: Secondary | ICD-10-CM

## 2023-03-23 DIAGNOSIS — M79673 Pain in unspecified foot: Secondary | ICD-10-CM | POA: Diagnosis not present

## 2023-03-23 DIAGNOSIS — R739 Hyperglycemia, unspecified: Secondary | ICD-10-CM | POA: Diagnosis not present

## 2023-03-23 DIAGNOSIS — E7800 Pure hypercholesterolemia, unspecified: Secondary | ICD-10-CM

## 2023-03-23 DIAGNOSIS — N1831 Chronic kidney disease, stage 3a: Secondary | ICD-10-CM

## 2023-03-23 DIAGNOSIS — Z0000 Encounter for general adult medical examination without abnormal findings: Secondary | ICD-10-CM | POA: Diagnosis not present

## 2023-03-23 DIAGNOSIS — Z0001 Encounter for general adult medical examination with abnormal findings: Secondary | ICD-10-CM

## 2023-03-23 NOTE — Patient Instructions (Signed)
Please consider changing the Zetia to the Repatha shots for cholesterol  You will be contacted regarding the referral for: podiatry  Please continue all other medications as before, and refills have been done if requested.  Please have the pharmacy call with any other refills you may need.  Please continue your efforts at being more active, low cholesterol diet, and weight control.  You are otherwise up to date with prevention measures today.  Please keep your appointments with your specialists as you may have planned  Please make an Appointment to return in 6 months, or sooner if needed, also with Lab Appointment for testing done 3-5 days before at the FIRST FLOOR Lab (so this is for TWO appointments - please see the scheduling desk as you leave)

## 2023-03-23 NOTE — Progress Notes (Unsigned)
Patient ID: Jake Simon, male   DOB: 26-Aug-1945, 78 y.o.   MRN: [id number]         Chief Complaint:: wellness exam and hld, right foot pain, ckd, hyperglycemia, hld, htn, low vit d       HPI:  Jake Simon is a 78 y.o. male here for wellness exam; up to date               Also Pt denies chest pain, increased sob or doe, wheezing, orthopnea, PND, increased LE swelling, palpitations, dizziness or syncope.  Pt denies polydipsia, polyuria, or new focal neuro s/s.    Pt denies fever, wt loss, night sweats, loss of appetite, or other constitutional symptoms  Does have plantar right foot pain with a hard spot, worse to walk for several months   Wt Readings from Last 3 Encounters:  03/23/23 163 lb (73.9 kg)  09/18/22 163 lb (73.9 kg)  03/20/22 165 lb (74.8 kg)   BP Readings from Last 3 Encounters:  03/23/23 124/72  09/18/22 126/80  03/20/22 128/64   Immunization History  Administered Date(s) Administered   Fluad Quad(high Dose 65+) 12/04/2018, 12/29/2019, 11/06/2020, 11/28/2021   H1N1 02/15/2008   Influenza Split 12/10/2010, 11/11/2011   Influenza Whole 01/05/2008, 12/12/2008, 12/11/2009   Influenza, High Dose Seasonal PF 12/08/2013, 11/22/2015, 11/25/2016, 12/31/2017   Influenza,inj,Quad PF,6+ Mos 11/23/2012, 11/16/2014   Influenza-Unspecified 11/14/2022   PFIZER(Purple Top)SARS-COV-2 Vaccination 04/10/2019, 05/05/2019, 01/16/2020   Pfizer Covid-19 Vaccine Bivalent Booster 46yrs & up 11/27/2020   Pfizer(Comirnaty)Fall Seasonal Vaccine 12 years and older 12/25/2021   Pneumococcal Conjugate-13 09/20/2013   Pneumococcal Polysaccharide-23 05/22/2009   Td 05/02/2008   Tdap 08/24/2018   Zoster Recombinant(Shingrix) 10/06/2016, 09/10/2017   Zoster, Live 08/20/2009   Health Maintenance Due  Topic Date Due   Medicare Annual Wellness (AWV)  12/28/2022      Past Medical History:  Diagnosis Date   Allergic rhinitis    Asthma    HTN (hypertension)    Hyperlipidemia    high  triglycerides   Sleep apnea    moderate   Vision blurred    epithelial basement membrane dystrophy   Past Surgical History:  Procedure Laterality Date   COLONOSCOPY     dental implants     HERNIA REPAIR     POLYPECTOMY     TONSILLECTOMY      reports that he has never smoked. He has never used smokeless tobacco. He reports current alcohol use. He reports that he does not use drugs. family history includes Alzheimer's disease in his mother; Arthritis in his sister; Asthma in his father; Breast cancer in his mother; COPD in his father; Cancer in his sister; Mental illness in his mother; Stroke in his father. Allergies  Allergen Reactions   Losartan Other (See Comments)    CKD   Current Outpatient Medications on File Prior to Visit  Medication Sig Dispense Refill   albuterol (VENTOLIN HFA) 108 (90 Base) MCG/ACT inhaler Inhale 2 puffs into the lungs every 6 (six) hours as needed for wheezing or shortness of breath. 3 each 3   aspirin EC 81 MG tablet Take 1 tablet (81 mg total) by mouth daily. Swallow whole. 30 tablet 12   atorvastatin (LIPITOR) 80 MG tablet TAKE 1 TABLET EVERY DAY 90 tablet 3   COVID-19 mRNA vaccine 2023-2024 (COMIRNATY) syringe Inject into the muscle. 0.3 mL 0   diltiazem (CARDIZEM CD) 240 MG 24 hr capsule TAKE 1 CAPSULE EVERY DAY 90 capsule 3  ezetimibe (ZETIA) 10 MG tablet TAKE 1 TABLET EVERY DAY 90 tablet 3   fenofibrate 160 MG tablet TAKE 1 TABLET EVERY DAY 90 tablet 3   Fluticasone Furoate (ARNUITY ELLIPTA) 100 MCG/ACT AEPB USE ONE INHALATION ONCE DAILY AS DIRECTED 30 each 11   furosemide (LASIX) 20 MG tablet TAKE 1 TABLET EVERY DAY 90 tablet 3   nebivolol (BYSTOLIC) 2.5 MG tablet Take 1 tablet (2.5 mg total) by mouth daily. 90 tablet 3   sodium chloride (MURO 128) 5 % ophthalmic ointment Place 1 application into both eyes as needed for irritation.     No current facility-administered medications on file prior to visit.        ROS:  All others reviewed and  negative.  Objective        PE:  BP 124/72 (BP Location: Right Arm, Patient Position: Sitting, Cuff Size: Normal)   Pulse 75   Temp 98.7 F (37.1 C) (Oral)   Ht 5\' 6"  (1.676 m)   Wt 163 lb (73.9 kg)   SpO2 97%   BMI 26.31 kg/m                 Constitutional: Pt appears in NAD               HENT: Head: NCAT.                Right Ear: External ear normal.                 Left Ear: External ear normal.                Eyes: . Pupils are equal, round, and reactive to light. Conjunctivae and EOM are normal               Nose: without d/c or deformity               Neck: Neck supple. Gross normal ROM               Cardiovascular: Normal rate and regular rhythm.                 Pulmonary/Chest: Effort normal and breath sounds without rales or wheezing.                Abd:  Soft, NT, ND, + BS, no organomegaly               Neurological: Pt is alert. At baseline orientation, motor grossly intact               Skin: Skin is warm. No rashes, no other new lesions, LE edema - none               Psychiatric: Pt behavior is normal without agitation   Micro: none  Cardiac tracings I have personally interpreted today:  none  Pertinent Radiological findings (summarize): none   Lab Results  Component Value Date   WBC 9.0 03/19/2023   HGB 15.3 03/19/2023   HCT 44.9 03/19/2023   PLT 214.0 03/19/2023   GLUCOSE 107 (H) 03/19/2023   CHOL 144 03/19/2023   TRIG 80.0 03/19/2023   HDL 46.40 03/19/2023   LDLDIRECT 106.0 09/04/2020   LDLCALC 82 03/19/2023   ALT 17 03/19/2023   AST 18 03/19/2023   NA 139 03/19/2023   K 3.9 03/19/2023   CL 105 03/19/2023   CREATININE 1.73 (H) 03/19/2023   BUN 32 (H) 03/19/2023   CO2 24 03/19/2023  TSH 2.40 03/19/2023   PSA 0.78 03/19/2023   HGBA1C 6.2 03/19/2023   Assessment/Plan:  Jake Simon is a 78 y.o. White or Caucasian [1] male with  has a past medical history of Allergic rhinitis, Asthma, HTN (hypertension), Hyperlipidemia, Sleep apnea, and  Vision blurred.  Encounter for well adult exam with abnormal findings Age and sex appropriate education and counseling updated with regular exercise and diet Referrals for preventative services - none needed Immunizations addressed - none needed Smoking counseling  - none needed Evidence for depression or other mood disorder - none significant Most recent labs reviewed. 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/min Lab Results  Component Value Date   CREATININE 1.73 (H) 03/19/2023   Stable overall, cont to avoid nephrotoxins   Hyperglycemia Lab Results  Component Value Date   HGBA1C 6.2 03/19/2023   Stable, pt to continue current medical treatment  - diet, wt control   Hyperlipidemia Lab Results  Component Value Date   LDLCALC 82 03/19/2023   Uncontrolled, pt will consider change zetia to repatha but  not ready yet  Hypertension, uncontrolled BP Readings from Last 3 Encounters:  03/23/23 124/72  09/18/22 126/80  03/20/22 128/64   Stable, pt to continue medical treatment card cd 240 every day, bystolic 2.5 qd   Vitamin D deficiency Last vitamin D Lab Results  Component Value Date   VD25OH 52.42 03/19/2023   Stable, cont oral replacement   Pain of foot With possible corn or wart - for referral podiatry  Followup: Return in about 6 months (around 09/20/2023).  Oliver Barre, MD 03/25/2023 7:00 AM [facility]

## 2023-03-25 ENCOUNTER — Encounter: Payer: Self-pay | Admitting: Internal Medicine

## 2023-03-25 DIAGNOSIS — M79673 Pain in unspecified foot: Secondary | ICD-10-CM | POA: Insufficient documentation

## 2023-03-25 NOTE — Assessment & Plan Note (Signed)
BP Readings from Last 3 Encounters:  03/23/23 124/72  09/18/22 126/80  03/20/22 128/64   Stable, pt to continue medical treatment card cd 240 every day, bystolic 2.5 qd

## 2023-03-25 NOTE — Assessment & Plan Note (Signed)

## 2023-03-25 NOTE — Assessment & Plan Note (Signed)
Lab Results  Component Value Date   LDLCALC 82 03/19/2023   Uncontrolled, pt will consider change zetia to repatha but  not ready yet

## 2023-03-25 NOTE — Assessment & Plan Note (Signed)
Last vitamin D Lab Results  Component Value Date   VD25OH 52.42 03/19/2023   Stable, cont oral replacement

## 2023-03-25 NOTE — Assessment & Plan Note (Signed)
Lab Results  Component Value Date   CREATININE 1.73 (H) 03/19/2023   Stable overall, cont to avoid nephrotoxins

## 2023-03-25 NOTE — Assessment & Plan Note (Signed)
Lab Results  Component Value Date   HGBA1C 6.2 03/19/2023   Stable, pt to continue current medical treatment  - diet, wt control

## 2023-03-25 NOTE — Assessment & Plan Note (Signed)
With possible corn or wart - for referral podiatry

## 2023-04-01 ENCOUNTER — Other Ambulatory Visit: Payer: Self-pay | Admitting: Internal Medicine

## 2023-04-02 ENCOUNTER — Other Ambulatory Visit: Payer: Self-pay

## 2023-04-07 ENCOUNTER — Encounter: Payer: Self-pay | Admitting: Internal Medicine

## 2023-05-11 DIAGNOSIS — L218 Other seborrheic dermatitis: Secondary | ICD-10-CM | POA: Diagnosis not present

## 2023-05-11 DIAGNOSIS — Z85820 Personal history of malignant melanoma of skin: Secondary | ICD-10-CM | POA: Diagnosis not present

## 2023-05-11 DIAGNOSIS — B353 Tinea pedis: Secondary | ICD-10-CM | POA: Diagnosis not present

## 2023-05-11 DIAGNOSIS — L821 Other seborrheic keratosis: Secondary | ICD-10-CM | POA: Diagnosis not present

## 2023-05-11 DIAGNOSIS — D1801 Hemangioma of skin and subcutaneous tissue: Secondary | ICD-10-CM | POA: Diagnosis not present

## 2023-05-11 DIAGNOSIS — Z85828 Personal history of other malignant neoplasm of skin: Secondary | ICD-10-CM | POA: Diagnosis not present

## 2023-05-19 ENCOUNTER — Ambulatory Visit (INDEPENDENT_AMBULATORY_CARE_PROVIDER_SITE_OTHER): Payer: Medicare HMO

## 2023-05-19 VITALS — Ht 66.0 in | Wt 163.0 lb

## 2023-05-19 DIAGNOSIS — Z0000 Encounter for general adult medical examination without abnormal findings: Secondary | ICD-10-CM | POA: Diagnosis not present

## 2023-05-19 NOTE — Progress Notes (Signed)
 Subjective:   Jake Simon is a 78 y.o. who presents for a Medicare Wellness preventive visit.  Visit Complete: Virtual I connected with  Jake Simon on 05/19/23 by a video and audio enabled telemedicine application and verified that I am speaking with the correct person using two identifiers.  Patient Location: Home  Provider Location: Home Office  I discussed the limitations of evaluation and management by telemedicine. The patient expressed understanding and agreed to proceed.  Vital Signs: Because this visit was a virtual/telehealth visit, some criteria may be missing or patient reported. Any vitals not documented were not able to be obtained and vitals that have been documented are patient reported.   Persons Participating in Visit: Patient.  AWV Questionnaire: No: Patient Medicare AWV questionnaire was not completed prior to this visit.  Cardiac Risk Factors include: advanced age (>36men, >109 women);hypertension;male gender;dyslipidemia;Other (see comment)     Objective:    Today's Vitals   05/19/23 1318  Weight: 163 lb (73.9 kg)  Height: 5\' 6"  (1.676 m)   Body mass index is 26.31 kg/m.     05/19/2023    1:56 PM 12/27/2021    9:50 AM 12/11/2020   11:55 AM 08/26/2019   10:19 AM 10/09/2017    3:40 PM 09/30/2016   11:27 AM 05/02/2015    1:44 PM  Advanced Directives  Does Patient Have a Medical Advance Directive? Yes Yes Yes Yes Yes Yes Yes  Type of Estate agent of [place];Living will Healthcare Power of [place];Living will Living will;Healthcare Power of [employer] Power of [place];Living will Healthcare Power of [place];Living will   Does patient want to make changes to medical advance directive? No - Patient declined  No - Patient declined No - Patient declined     Copy of Healthcare Power of Attorney in Chart? Yes - validated most recent copy scanned in chart (See row information) No - copy requested No - copy requested  Yes  No - copy requested Yes    Current Medications (verified) Outpatient Encounter Medications as of 05/19/2023  Medication Sig   albuterol (VENTOLIN HFA) 108 (90 Base) MCG/ACT inhaler Inhale 2 puffs into the lungs every 6 (six) hours as needed for wheezing or shortness of breath.   aspirin EC 81 MG tablet Take 1 tablet (81 mg total) by mouth daily. Swallow whole.   atorvastatin (LIPITOR) 80 MG tablet TAKE 1 TABLET EVERY DAY   COVID-19 mRNA vaccine 2023-2024 (COMIRNATY) syringe Inject into the muscle.   diltiazem (CARDIZEM CD) 240 MG 24 hr capsule TAKE 1 CAPSULE EVERY DAY   ezetimibe (ZETIA) 10 MG tablet TAKE 1 TABLET EVERY DAY   fenofibrate 160 MG tablet TAKE 1 TABLET EVERY DAY   Fluticasone Furoate (ARNUITY ELLIPTA) 100 MCG/ACT AEPB USE ONE INHALATION ONCE DAILY AS DIRECTED   furosemide (LASIX) 20 MG tablet TAKE 1 TABLET EVERY DAY   nebivolol (BYSTOLIC) 2.5 MG tablet Take 1 tablet (2.5 mg total) by mouth daily.   sodium chloride (MURO 128) 5 % ophthalmic ointment Place 1 application into both eyes as needed for irritation.   No facility-administered encounter medications on file as of 05/19/2023.    Allergies (verified) Losartan   History: Past Medical History:  Diagnosis Date   Allergic rhinitis    Asthma    HTN (hypertension)    Hyperlipidemia    high triglycerides   Sleep apnea    moderate   Vision blurred    epithelial basement membrane dystrophy  Past Surgical History:  Procedure Laterality Date   COLONOSCOPY     dental implants     HERNIA REPAIR     POLYPECTOMY     TONSILLECTOMY     Family History  Problem Relation Age of Onset   Alzheimer's disease Mother    Breast cancer Mother    Mental illness Mother        alzheimers   COPD Father    Asthma Father    Stroke Father    Cancer Sister        brain cancer   Arthritis Sister    Diabetes Neg Hx    Prostate cancer Neg Hx    Colon cancer Neg Hx    Heart disease Neg Hx    Hyperlipidemia Neg Hx    Rectal  cancer Neg Hx    Stomach cancer Neg Hx    Social History   Socioeconomic History   Marital status: Married    Spouse name: Jake Simon   Number of children: 2   Years of education: 18   Highest education level: Master's degree (e.g., MA, MS, MEng, MEd, MSW, MBA)  Occupational History   Occupation: RETIRED/[employer], [employer]    Comment: retired  Tobacco Use   Smoking status: Never   Smokeless tobacco: Never  Vaping Use   Vaping status: Never Used  Substance and Sexual Activity   Alcohol use: Yes    Alcohol/week: 0.0 standard drinks of alcohol    Comment: occasional beer, wine   Drug use: No   Sexual activity: Not Currently  Other Topics Concern   Not on file  Social History Narrative     [place] [employer]; [place] @ [employer] for [employer].  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  Has pet. Playing contract bridge. ACP - raised the issues and encouraged the appointment of a HCPOA and to consider detailed directive re: care.       Lives at home with wife and 1 dog/2025            Social Drivers of Health   Financial Resource Strain: Low Risk  (05/19/2023)   Overall Financial Resource Strain (CARDIA)    Difficulty of Paying Living Expenses: Not hard at all  Food Insecurity: No Food Insecurity (05/19/2023)   Hunger Vital Sign    Worried About Running Out of Food in the Last Year: Never true    Ran Out of Food in the Last Year: Never true  Transportation Needs: No Transportation Needs (05/19/2023)   PRAPARE - Administrator, Civil Service (Medical): No    Lack of Transportation (Non-Medical): No  Physical Activity: Sufficiently Active (05/19/2023)   Exercise Vital Sign    Days of Exercise per Week: 7 days    Minutes of Exercise per Session: 60 min  Recent Concern: Physical Activity - Insufficiently Active (03/19/2023)   Exercise Vital Sign    Days of Exercise per Week: 7 days    Minutes of Exercise per Session: 20 min  Stress: No Stress Concern Present  (05/19/2023)   [employer] of Occupational Health - Occupational Stress Questionnaire    Feeling of Stress : Not at all  Social Connections: Socially Integrated (05/19/2023)   Social Connection and Isolation Panel [NHANES]    Frequency of Communication with Friends and Family: More than three times a week    Frequency of Social Gatherings with Friends and Family: Once a week    Attends Religious Services: More than 4 times  per year    Active Member of Clubs or Organizations: Yes    Attends Banker Meetings: Never    Marital Status: Married    Tobacco Counseling Counseling given: Not Answered    Clinical Intake:  Pre-visit preparation completed: Yes  Pain : No/denies pain     BMI - recorded: 26.31 Nutritional Status: BMI 25 -29 Overweight Nutritional Risks: None Diabetes: No  How often do you need to have someone help you when you read instructions, pamphlets, or other written materials from your doctor or pharmacy?: 1 - Never  Interpreter Needed?: No  Information entered by :: Brookelle Pellicane, RMA   Activities of Daily Living     05/19/2023    1:53 PM  In your present state of health, do you have any difficulty performing the following activities:  Hearing? 0  Vision? 0  Difficulty concentrating or making decisions? 0  Walking or climbing stairs? 0  Dressing or bathing? 0  Doing errands, shopping? 0  Preparing Food and eating ? N  Using the Toilet? N  In the past six months, have you accidently leaked urine? N  Do you have problems with loss of bowel control? N  Managing your Medications? N  Managing your Finances? N  Housekeeping or managing your Housekeeping? N    Patient Care Team: Corwin Levins, MD as PCP - General (Internal Medicine) Louis Meckel, MD (Inactive) (Gastroenterology) Ernesto Rutherford, MD (Ophthalmology) Jethro Bolus, MD (Inactive) (Urology) Donzetta Starch, MD as Consulting Physician (Dermatology)  Indicate any  recent Medical Services you may have received from other than [facility] providers in the past year (date may be approximate).     Assessment:   This is a routine wellness examination for Khalon.  Hearing/Vision screen Hearing Screening - Comments:: Per pt sometimes Vision Screening - Comments:: Wears eyeglasses   Goals Addressed             This Visit's Progress    Patient Stated   On track    I love to travel mainly by taking cruises. Enjoy life continue to play bridge.       Depression Screen     05/19/2023    2:00 PM 03/23/2023    9:38 AM 09/18/2022    9:42 AM 03/20/2022   10:10 AM 03/20/2022    9:48 AM 12/27/2021    9:48 AM 09/17/2021    2:20 PM  PHQ 2/9 Scores  PHQ - 2 Score 0 0 0 0 0 0 0  PHQ- 9 Score 0      0    Fall Risk     05/19/2023    1:56 PM 03/23/2023    9:38 AM 09/18/2022    9:42 AM 03/20/2022   10:10 AM 03/20/2022    9:48 AM  Fall Risk   Falls in the past year? 0 0 0 0 0  Number falls in past yr: 0 0 0 0 0  Injury with Fall? 0 0 0 0 0  Risk for fall due to : No Fall Risks No Fall Risks No Fall Risks  No Fall Risks  Follow up Falls prevention discussed;Falls evaluation completed Falls evaluation completed Falls evaluation completed  Falls evaluation completed    MEDICARE RISK AT HOME:  Medicare Risk at Home Any stairs in or around the home?: Yes If so, are there any without handrails?: Yes Home free of loose throw rugs in walkways, pet beds, electrical cords, etc?: Yes Adequate lighting in your home to  reduce risk of falls?: Yes Life alert?: No Use of a cane, walker or w/c?: No Grab bars in the bathroom?: Yes Shower chair or bench in shower?: Yes Elevated toilet seat or a handicapped toilet?: Yes  TIMED UP AND GO:  Was the test performed?  No  Cognitive Function: Normal: Normal cognitive status assessed by direct observation by this Clinical Health Advisor. No abnormalities found. Patient is able to answer questions in an accurate and timely  manner.    05/02/2015    1:53 PM  MMSE - Mini Mental State Exam  Not completed: --        12/27/2021    9:50 AM 08/26/2019   10:34 AM  6CIT Screen  What Year? 0 points 0 points  What month? 0 points 0 points  What time? 0 points 0 points  Count back from 20 0 points 0 points  Months in reverse 0 points 0 points  Repeat phrase 0 points 0 points  Total Score 0 points 0 points    Immunizations Immunization History  Administered Date(s) Administered   Fluad Quad(high Dose 65+) 12/04/2018, 12/29/2019, 11/06/2020, 11/28/2021   H1N1 02/15/2008   Influenza Split 12/10/2010, 11/11/2011   Influenza Whole 01/05/2008, 12/12/2008, 12/11/2009   Influenza, High Dose Seasonal PF 12/08/2013, 11/22/2015, 11/25/2016, 12/31/2017   Influenza,inj,Quad PF,6+ Mos 11/23/2012, 11/16/2014   Influenza-Unspecified 11/14/2022   PFIZER(Purple Top)SARS-COV-2 Vaccination 04/10/2019, 05/05/2019, 01/16/2020   Pfizer Covid-19 Vaccine Bivalent Booster 48yrs & up 11/27/2020   Pfizer(Comirnaty)Fall Seasonal Vaccine 12 years and older 12/25/2021   Pneumococcal Conjugate-13 09/20/2013   Pneumococcal Polysaccharide-23 05/22/2009   RSV,unspecified 04/07/2023   Respiratory Syncytial Virus Vaccine,Recomb Aduvanted(Arexvy) 04/07/2023   Td 05/02/2008   Tdap 08/24/2018   Zoster Recombinant(Shingrix) 10/06/2016, 09/10/2017   Zoster, Live 08/20/2009    Screening Tests Health Maintenance  Topic Date Due   Medicare Annual Wellness (AWV)  12/28/2022   DTaP/Tdap/Td (3 - Td or Tdap) 08/23/2028   INFLUENZA VACCINE  Completed   Hepatitis C Screening  Completed   Zoster Vaccines- Shingrix  Completed   HPV VACCINES  Aged Out   Pneumonia Vaccine 80+ Years old  Discontinued   Colonoscopy  Discontinued   COVID-19 Vaccine  Discontinued    Health Maintenance  Health Maintenance Due  Topic Date Due   Medicare Annual Wellness (AWV)  12/28/2022   Health Maintenance Items Addressed: See Nurse Notes  Additional  Screening:  Vision Screening: Recommended annual ophthalmology exams for early detection of glaucoma and other disorders of the eye.  Dental Screening: Recommended annual dental exams for proper oral hygiene  Community Resource Referral / Chronic Care Management: CRR required this visit?  No   CCM required this visit?  No     Plan:     I have personally reviewed and noted the following in the patient's chart:   Medical and social history Use of alcohol, tobacco or illicit drugs  Current medications and supplements including opioid prescriptions. Patient is not currently taking opioid prescriptions. Functional ability and status Nutritional status Physical activity Advanced directives List of other physicians Hospitalizations, surgeries, and ER visits in previous 12 months Vitals Screenings to include cognitive, depression, and falls Referrals and appointments  In addition, I have reviewed and discussed with patient certain preventive protocols, quality metrics, and best practice recommendations. A written personalized care plan for preventive services as well as general preventive health recommendations were provided to patient.     Danecia Underdown L Klynn Linnemann, CMA   05/19/2023   After Visit Summary: (  MyChart) Due to this being a telephonic visit, the after visit summary with patients personalized plan was offered to patient via MyChart   Notes: Nothing significant to report at this time.

## 2023-05-19 NOTE — Patient Instructions (Signed)
 Mr. Petteway , Thank you for taking time to come for your Medicare Wellness Visit. I appreciate your ongoing commitment to your health goals. Please review the following plan we discussed and let me know if I can assist you in the future.   Referrals/Orders/Follow-Ups/Clinician Recommendations: It was nice talking with you today.  Keep up the good work.  Safe travels for you and Mrs. [place].  This is a list of the screening recommended for you and due dates:  Health Maintenance  Topic Date Due   Medicare Annual Wellness Visit  12/28/2022   DTaP/Tdap/Td vaccine (3 - Td or Tdap) 08/23/2028   Flu Shot  Completed   Hepatitis C Screening  Completed   Zoster (Shingles) Vaccine  Completed   HPV Vaccine  Aged Out   Pneumonia Vaccine  Discontinued   Colon Cancer Screening  Discontinued   COVID-19 Vaccine  Discontinued    Advanced directives: (In Chart) A copy of your advanced directives are scanned into your chart should your provider ever need it.  Next Medicare Annual Wellness Visit scheduled for next year: Yes

## 2023-06-02 ENCOUNTER — Other Ambulatory Visit: Payer: Self-pay | Admitting: Internal Medicine

## 2023-06-02 ENCOUNTER — Other Ambulatory Visit: Payer: Self-pay

## 2023-06-15 DIAGNOSIS — H2513 Age-related nuclear cataract, bilateral: Secondary | ICD-10-CM | POA: Diagnosis not present

## 2023-06-15 DIAGNOSIS — H04123 Dry eye syndrome of bilateral lacrimal glands: Secondary | ICD-10-CM | POA: Diagnosis not present

## 2023-06-15 DIAGNOSIS — H40053 Ocular hypertension, bilateral: Secondary | ICD-10-CM | POA: Diagnosis not present

## 2023-06-15 DIAGNOSIS — H43813 Vitreous degeneration, bilateral: Secondary | ICD-10-CM | POA: Diagnosis not present

## 2023-06-15 DIAGNOSIS — H18593 Other hereditary corneal dystrophies, bilateral: Secondary | ICD-10-CM | POA: Diagnosis not present

## 2023-06-15 DIAGNOSIS — D3131 Benign neoplasm of right choroid: Secondary | ICD-10-CM | POA: Diagnosis not present

## 2023-07-06 ENCOUNTER — Other Ambulatory Visit: Payer: Self-pay | Admitting: Internal Medicine

## 2023-07-06 ENCOUNTER — Other Ambulatory Visit: Payer: Self-pay

## 2023-07-31 ENCOUNTER — Telehealth: Payer: Self-pay | Admitting: Internal Medicine

## 2023-07-31 MED ORDER — ATORVASTATIN CALCIUM 40 MG PO TABS: 40.0000 mg | ORAL_TABLET | Freq: Two times a day (BID) | ORAL | 3 refills | Status: DC

## 2023-07-31 NOTE — Telephone Encounter (Signed)
 Copied from CRM [id number]. Topic: Clinical - Medication Question >> Jul 31, 2023  9:33 AM Luane Rumps D wrote: Reason for CRM: Bridgette Campus from [facility] calling about Jake Simon atorvastatin  (LIPITOR) 80 MG tablet. Pharmacy changed manufacturer & patient is having trouble swallowing, 40mg  x2 would still be covered by insurance but would be easier for him to swallow since it would be smaller pills - she is wondering if a new prescription can be sent.

## 2023-07-31 NOTE — Telephone Encounter (Signed)
 Ok this is done to [facility]

## 2023-08-03 MED ORDER — ATORVASTATIN CALCIUM 40 MG PO TABS: 40.0000 mg | ORAL_TABLET | Freq: Two times a day (BID) | ORAL | 3 refills | Status: DC

## 2023-08-03 NOTE — Telephone Encounter (Signed)
 Ok done erx

## 2023-08-03 NOTE — Telephone Encounter (Signed)
 Copied from CRM [id number]. Topic: Clinical - Medication Question >> Jul 31, 2023  9:33 AM Luane Rumps D wrote: Reason for CRM: Bridgette Campus from [facility] calling about Mr. Elamin atorvastatin  (LIPITOR) 80 MG tablet. Pharmacy changed manufacturer & patient is having trouble swallowing, 40mg  x2 would still be covered by insurance but would be easier for him to swallow since it would be smaller pills - she is wondering if a new prescription can be sent. >> Jul 31, 2023  5:02 PM Abigail D wrote: [facility] called requesting clarification on the atorvastatin  (LIPITOR) 80 MG tablet, pharmacist was confused as she had never seen it prescribed like this so I had explained the situation regarding Mr. Saintjean, she will call him to confirm whether or not he wants it filled with [facility] or [facility].

## 2023-08-07 ENCOUNTER — Encounter: Payer: Self-pay | Admitting: Internal Medicine

## 2023-08-07 MED ORDER — ATORVASTATIN CALCIUM 40 MG PO TABS: 40.0000 mg | ORAL_TABLET | Freq: Two times a day (BID) | ORAL | 3 refills | Status: DC

## 2023-09-15 ENCOUNTER — Encounter: Payer: Self-pay | Admitting: Internal Medicine

## 2023-09-17 ENCOUNTER — Other Ambulatory Visit

## 2023-09-17 DIAGNOSIS — R739 Hyperglycemia, unspecified: Secondary | ICD-10-CM | POA: Diagnosis not present

## 2023-09-17 DIAGNOSIS — E7800 Pure hypercholesterolemia, unspecified: Secondary | ICD-10-CM

## 2023-09-17 DIAGNOSIS — E559 Vitamin D deficiency, unspecified: Secondary | ICD-10-CM

## 2023-09-17 LAB — BASIC METABOLIC PANEL WITH GFR
BUN: 31 mg/dL — ABNORMAL HIGH (ref 6–23)
CO2: 25 meq/L (ref 19–32)
Calcium: 9 mg/dL (ref 8.4–10.5)
Chloride: 103 meq/L (ref 96–112)
Creatinine, Ser: 1.87 mg/dL — ABNORMAL HIGH (ref 0.40–1.50)
GFR: 33.97 mL/min — ABNORMAL LOW (ref >60.00)
Glucose, Bld: 97 mg/dL (ref 70–99)
Potassium: 3.9 meq/L (ref 3.5–5.1)
Sodium: 136 meq/L (ref 135–145)

## 2023-09-17 LAB — VITAMIN D 25 HYDROXY (VIT D DEFICIENCY, FRACTURES): VITD: 59.11 ng/mL (ref 30.00–100.00)

## 2023-09-17 LAB — LIPID PANEL
Cholesterol: 137 mg/dL (ref 0–200)
HDL: 44 mg/dL (ref >39.00)
LDL Cholesterol: 72 mg/dL (ref 0–99)
NonHDL: 92.63
Total CHOL/HDL Ratio: 3
Triglycerides: 102 mg/dL (ref 0.0–149.0)
VLDL: 20.4 mg/dL (ref 0.0–40.0)

## 2023-09-17 LAB — HEPATIC FUNCTION PANEL
ALT: 18 U/L (ref 0–53)
AST: 19 U/L (ref 0–37)
Albumin: 4.4 g/dL (ref 3.5–5.2)
Alkaline Phosphatase: 34 U/L — ABNORMAL LOW (ref 39–117)
Bilirubin, Direct: 0.2 mg/dL (ref 0.0–0.3)
Total Bilirubin: 1.3 mg/dL — ABNORMAL HIGH (ref 0.2–1.2)
Total Protein: 7.2 g/dL (ref 6.0–8.3)

## 2023-09-17 LAB — HEMOGLOBIN A1C: Hgb A1c MFr Bld: 6.4 % (ref 4.6–6.5)

## 2023-09-21 ENCOUNTER — Ambulatory Visit (INDEPENDENT_AMBULATORY_CARE_PROVIDER_SITE_OTHER): Payer: Medicare HMO | Admitting: Internal Medicine

## 2023-09-21 ENCOUNTER — Encounter: Payer: Self-pay | Admitting: Internal Medicine

## 2023-09-21 ENCOUNTER — Telehealth: Payer: Self-pay

## 2023-09-21 VITALS — BP 130/66 | HR 80 | Temp 98.5°F | Ht 66.0 in | Wt 161.0 lb

## 2023-09-21 DIAGNOSIS — E7800 Pure hypercholesterolemia, unspecified: Secondary | ICD-10-CM | POA: Diagnosis not present

## 2023-09-21 DIAGNOSIS — N1831 Chronic kidney disease, stage 3a: Secondary | ICD-10-CM | POA: Diagnosis not present

## 2023-09-21 DIAGNOSIS — I10 Essential (primary) hypertension: Secondary | ICD-10-CM | POA: Diagnosis not present

## 2023-09-21 DIAGNOSIS — R739 Hyperglycemia, unspecified: Secondary | ICD-10-CM

## 2023-09-21 DIAGNOSIS — Z125 Encounter for screening for malignant neoplasm of prostate: Secondary | ICD-10-CM | POA: Diagnosis not present

## 2023-09-21 DIAGNOSIS — E559 Vitamin D deficiency, unspecified: Secondary | ICD-10-CM

## 2023-09-21 DIAGNOSIS — E538 Deficiency of other specified B group vitamins: Secondary | ICD-10-CM

## 2023-09-21 NOTE — Assessment & Plan Note (Signed)
 Lab Results  Component Value Date   LDLCALC 72 09/17/2023   Uncontrolled, pt to continue current statin lipitor 40 mg and zetia  - declines change of zetia  to repatha today

## 2023-09-21 NOTE — Assessment & Plan Note (Signed)
 Last vitamin D  Lab Results  Component Value Date   VD25OH 59.11 09/17/2023   Stable, cont oral replacement

## 2023-09-21 NOTE — Assessment & Plan Note (Signed)
 Lab Results  Component Value Date   CREATININE 1.87 (H) 09/17/2023   Stable overall, cont to avoid nephrotoxins

## 2023-09-21 NOTE — Telephone Encounter (Signed)
 Good morning!  Reminder for lab orders to be placed for patient so they can have them done 3-4 days prior to their follow up in 6 months

## 2023-09-21 NOTE — Assessment & Plan Note (Signed)
 Lab Results  Component Value Date   HGBA1C 6.4 09/17/2023   Stable, pt to continue current medical treatment  - diet, wt control

## 2023-09-21 NOTE — Assessment & Plan Note (Signed)
 BP Readings from Last 3 Encounters:  09/21/23 130/66  03/23/23 124/72  09/18/22 126/80   Stable, pt to continue medical treatment card cd 240 every day, bystolic  2.5 qd

## 2023-09-21 NOTE — Progress Notes (Signed)
 Patient ID: Jake Simon, male   DOB: 07-27-1945, 78 y.o.   MRN: [id number]        Chief Complaint: follow up hyperglycemia, hld, htn, low vit d, ckd3a       HPI:  Jake Simon is a 78 y.o. male here overall doing ok, Pt denies chest pain, increased sob or doe, wheezing, orthopnea, PND, increased LE swelling, palpitations, dizziness or syncope.   Pt denies polydipsia, polyuria, or new focal neuro s/s.    Pt denies fever, wt loss, night sweats, loss of appetite, or other constitutional symptoms   Saw dermatology tx with OTC Lamisil HT for athetes feet, doing better.  Has f/u renal appt oct 14.    Wt Readings from Last 3 Encounters:  09/21/23 161 lb (73 kg)  05/19/23 163 lb (73.9 kg)  03/23/23 163 lb (73.9 kg)   BP Readings from Last 3 Encounters:  09/21/23 130/66  03/23/23 124/72  09/18/22 126/80         Past Medical History:  Diagnosis Date   Allergic rhinitis    Asthma    HTN (hypertension)    Hyperlipidemia    high triglycerides   Sleep apnea    moderate   Vision blurred    epithelial basement membrane dystrophy   Past Surgical History:  Procedure Laterality Date   COLONOSCOPY     dental implants     HERNIA REPAIR     POLYPECTOMY     TONSILLECTOMY      reports that he has never smoked. He has never used smokeless tobacco. He reports current alcohol use. He reports that he does not use drugs. family history includes Alzheimer's disease in his mother; Arthritis in his sister; Asthma in his father; Breast cancer in his mother; COPD in his father; Cancer in his sister; Mental illness in his mother; Stroke in his father. Allergies  Allergen Reactions   Losartan  Other (See Comments)    CKD   Current Outpatient Medications on File Prior to Visit  Medication Sig Dispense Refill   albuterol  (VENTOLIN  HFA) 108 (90 Base) MCG/ACT inhaler Inhale 2 puffs into the lungs every 6 (six) hours as needed for wheezing or shortness of breath. 3 each 3   aspirin  EC 81 MG tablet Take  1 tablet (81 mg total) by mouth daily. Swallow whole. 30 tablet 12   atorvastatin  (LIPITOR) 40 MG tablet Take 1 tablet (40 mg total) by mouth 2 (two) times daily. 180 tablet 3   COVID-19 mRNA vaccine 2023-2024 (COMIRNATY ) syringe Inject into the muscle. 0.3 mL 0   diltiazem  (CARDIZEM  CD) 240 MG 24 hr capsule TAKE 1 CAPSULE EVERY DAY 90 capsule 3   ezetimibe  (ZETIA ) 10 MG tablet TAKE 1 TABLET EVERY DAY 90 tablet 3   fenofibrate  160 MG tablet TAKE 1 TABLET EVERY DAY 90 tablet 3   Fluticasone  Furoate (ARNUITY ELLIPTA ) 100 MCG/ACT AEPB USE ONE INHALATION ONCE DAILY AS DIRECTED 30 each 11   furosemide  (LASIX ) 20 MG tablet TAKE 1 TABLET EVERY DAY 90 tablet 3   nebivolol  (BYSTOLIC ) 2.5 MG tablet Take 1 tablet (2.5 mg total) by mouth daily. 90 tablet 3   sodium chloride  (MURO 128) 5 % ophthalmic ointment Place 1 application into both eyes as needed for irritation.     No current facility-administered medications on file prior to visit.        ROS:  All others reviewed and negative.  Objective        PE:  BP  130/66   Pulse 80   Temp 98.5 F (36.9 C)   Ht 5' 6 (1.676 m)   Wt 161 lb (73 kg)   SpO2 97%   BMI 25.99 kg/m                 Constitutional: Pt appears in NAD               HENT: Head: NCAT.                Right Ear: External ear normal.                 Left Ear: External ear normal.                Eyes: . Pupils are equal, round, and reactive to light. Conjunctivae and EOM are normal               Nose: without d/c or deformity               Neck: Neck supple. Gross normal ROM               Cardiovascular: Normal rate and regular rhythm.                 Pulmonary/Chest: Effort normal and breath sounds without rales or wheezing.                Abd:  Soft, NT, ND, + BS, no organomegaly               Neurological: Pt is alert. At baseline orientation, motor grossly intact               Skin: Skin is warm. No rashes, no other new lesions, LE edema - none               Psychiatric: Pt  behavior is normal without agitation   Micro: none  Cardiac tracings I have personally interpreted today:  none  Pertinent Radiological findings (summarize): none   Lab Results  Component Value Date   WBC 9.0 03/19/2023   HGB 15.3 03/19/2023   HCT 44.9 03/19/2023   PLT 214.0 03/19/2023   GLUCOSE 97 09/17/2023   CHOL 137 09/17/2023   TRIG 102.0 09/17/2023   HDL 44.00 09/17/2023   LDLDIRECT 106.0 09/04/2020   LDLCALC 72 09/17/2023   ALT 18 09/17/2023   AST 19 09/17/2023   NA 136 09/17/2023   K 3.9 09/17/2023   CL 103 09/17/2023   CREATININE 1.87 (H) 09/17/2023   BUN 31 (H) 09/17/2023   CO2 25 09/17/2023   TSH 2.40 03/19/2023   PSA 0.78 03/19/2023   HGBA1C 6.4 09/17/2023   Assessment/Plan:  Jake Simon is a 78 y.o. White or Caucasian [1] male with  has a past medical history of Allergic rhinitis, Asthma, HTN (hypertension), Hyperlipidemia, Sleep apnea, and Vision blurred.  CKD (chronic kidney disease) stage 3, GFR 30-59 ml/min Lab Results  Component Value Date   CREATININE 1.87 (H) 09/17/2023   Stable overall, cont to avoid nephrotoxins   Hyperglycemia Lab Results  Component Value Date   HGBA1C 6.4 09/17/2023   Stable, pt to continue current medical treatment  - diet, wt control   Hyperlipidemia Lab Results  Component Value Date   LDLCALC 72 09/17/2023   Uncontrolled, pt to continue current statin lipitor 40 mg and zetia  - declines change of zetia  to repatha today   Hypertension, uncontrolled BP Readings from Last 3 Encounters:  09/21/23 130/66  03/23/23 124/72  09/18/22 126/80   Stable, pt to continue medical treatment card cd 240 every day, bystolic  2.5 qd   Vitamin D  deficiency Last vitamin D  Lab Results  Component Value Date   VD25OH 59.11 09/17/2023   Stable, cont oral replacement  Followup: Return in about 6 months (around 03/23/2024).  Lynwood Rush, MD 09/21/2023 8:09 PM [facility]

## 2023-09-21 NOTE — Addendum Note (Signed)
 Addended by: NORLEEN LYNWOOD ORN on: 09/21/2023 08:11 PM   Modules accepted: Orders

## 2023-09-21 NOTE — Patient Instructions (Addendum)
 Please continue all other medications as before, and refills have been done if requested.  Please have the pharmacy call with any other refills you may need.  Please continue your efforts at being more active, low cholesterol diet, and weight control.  You are otherwise up to date with prevention measures today.  Please keep your appointments with your specialists as you may have planned - renal in October 14  Please make an Appointment to return in 6 months, or sooner if needed, also with Lab Appointment for testing done 3-5 days before at the FIRST FLOOR Lab (so this is for TWO appointments - please see the scheduling desk as you leave)

## 2023-11-11 ENCOUNTER — Encounter: Payer: Self-pay | Admitting: Internal Medicine

## 2023-11-11 ENCOUNTER — Other Ambulatory Visit: Payer: Self-pay | Admitting: Internal Medicine

## 2023-11-12 MED ORDER — AZITHROMYCIN 250 MG PO TABS: ORAL_TABLET | ORAL | 1 refills | Status: AC

## 2023-12-08 DIAGNOSIS — N184 Chronic kidney disease, stage 4 (severe): Secondary | ICD-10-CM | POA: Diagnosis not present

## 2023-12-14 ENCOUNTER — Ambulatory Visit

## 2023-12-14 DIAGNOSIS — Z23 Encounter for immunization: Secondary | ICD-10-CM | POA: Diagnosis not present

## 2023-12-14 NOTE — Progress Notes (Signed)
 After obtaining consent, and per orders of Dr. Joshua, injection of HD Flu given by Edsel CHRISTELLA Kerns. Pt tolerated well.

## 2023-12-15 DIAGNOSIS — I129 Hypertensive chronic kidney disease with stage 1 through stage 4 chronic kidney disease, or unspecified chronic kidney disease: Secondary | ICD-10-CM | POA: Diagnosis not present

## 2023-12-15 DIAGNOSIS — N1832 Chronic kidney disease, stage 3b: Secondary | ICD-10-CM | POA: Diagnosis not present

## 2024-01-08 ENCOUNTER — Encounter: Payer: Self-pay | Admitting: Internal Medicine

## 2024-02-01 ENCOUNTER — Other Ambulatory Visit: Payer: Self-pay | Admitting: Internal Medicine

## 2024-02-11 ENCOUNTER — Encounter: Payer: Self-pay | Admitting: Internal Medicine

## 2024-03-22 ENCOUNTER — Other Ambulatory Visit

## 2024-03-22 DIAGNOSIS — E538 Deficiency of other specified B group vitamins: Secondary | ICD-10-CM | POA: Diagnosis not present

## 2024-03-22 DIAGNOSIS — E7800 Pure hypercholesterolemia, unspecified: Secondary | ICD-10-CM

## 2024-03-22 DIAGNOSIS — E559 Vitamin D deficiency, unspecified: Secondary | ICD-10-CM

## 2024-03-22 DIAGNOSIS — R739 Hyperglycemia, unspecified: Secondary | ICD-10-CM

## 2024-03-22 DIAGNOSIS — Z125 Encounter for screening for malignant neoplasm of prostate: Secondary | ICD-10-CM

## 2024-03-22 LAB — CBC WITH DIFFERENTIAL/PLATELET
Basophils Absolute: 0.1 K/uL (ref 0.0–0.1)
Basophils Relative: 1.3 % (ref 0.0–3.0)
Eosinophils Absolute: 0.3 K/uL (ref 0.0–0.7)
Eosinophils Relative: 3.8 % (ref 0.0–5.0)
HCT: 42.8 % (ref 39.0–52.0)
Hemoglobin: 14.8 g/dL (ref 13.0–17.0)
Lymphocytes Relative: 20.3 % (ref 12.0–46.0)
Lymphs Abs: 1.7 K/uL (ref 0.7–4.0)
MCHC: 34.5 g/dL (ref 30.0–36.0)
MCV: 89.5 fl (ref 78.0–100.0)
Monocytes Absolute: 0.8 K/uL (ref 0.1–1.0)
Monocytes Relative: 9.1 % (ref 3.0–12.0)
Neutro Abs: 5.5 K/uL (ref 1.4–7.7)
Neutrophils Relative %: 65.5 % (ref 43.0–77.0)
Platelets: 191 K/uL (ref 150.0–400.0)
RBC: 4.77 Mil/uL (ref 4.22–5.81)
RDW: 14.5 % (ref 11.5–15.5)
WBC: 8.4 K/uL (ref 4.0–10.5)

## 2024-03-22 LAB — TSH: TSH: 1.46 u[IU]/mL (ref 0.35–5.50)

## 2024-03-22 LAB — LIPID PANEL
Cholesterol: 125 mg/dL (ref 28–200)
HDL: 43.2 mg/dL
LDL Cholesterol: 58 mg/dL (ref 10–99)
NonHDL: 81.67
Total CHOL/HDL Ratio: 3
Triglycerides: 119 mg/dL (ref 10.0–149.0)
VLDL: 23.8 mg/dL (ref 0.0–40.0)

## 2024-03-22 LAB — BASIC METABOLIC PANEL WITH GFR
BUN: 28 mg/dL — ABNORMAL HIGH (ref 6–23)
CO2: 28 meq/L (ref 19–32)
Calcium: 9.1 mg/dL (ref 8.4–10.5)
Chloride: 102 meq/L (ref 96–112)
Creatinine, Ser: 1.81 mg/dL — ABNORMAL HIGH (ref 0.40–1.50)
GFR: 35.2 mL/min — ABNORMAL LOW
Glucose, Bld: 122 mg/dL — ABNORMAL HIGH (ref 70–99)
Potassium: 4.1 meq/L (ref 3.5–5.1)
Sodium: 138 meq/L (ref 135–145)

## 2024-03-22 LAB — URINALYSIS, ROUTINE W REFLEX MICROSCOPIC
Bilirubin Urine: NEGATIVE
Hgb urine dipstick: NEGATIVE
Leukocytes,Ua: NEGATIVE
Nitrite: NEGATIVE
RBC / HPF: NONE SEEN
Specific Gravity, Urine: 1.025 (ref 1.000–1.030)
Urine Glucose: NEGATIVE
Urobilinogen, UA: 0.2 (ref 0.0–1.0)
WBC, UA: NONE SEEN
pH: 6 (ref 5.0–8.0)

## 2024-03-22 LAB — HEPATIC FUNCTION PANEL
ALT: 18 U/L (ref 3–53)
AST: 19 U/L (ref 5–37)
Albumin: 4.3 g/dL (ref 3.5–5.2)
Alkaline Phosphatase: 36 U/L — ABNORMAL LOW (ref 39–117)
Bilirubin, Direct: 0.3 mg/dL (ref 0.1–0.3)
Total Bilirubin: 1.2 mg/dL (ref 0.2–1.2)
Total Protein: 7 g/dL (ref 6.0–8.3)

## 2024-03-22 LAB — PSA: PSA: 0.73 ng/mL (ref 0.10–4.00)

## 2024-03-22 LAB — VITAMIN B12: Vitamin B-12: 449 pg/mL (ref 211–911)

## 2024-03-22 LAB — VITAMIN D 25 HYDROXY (VIT D DEFICIENCY, FRACTURES): VITD: 53.29 ng/mL (ref 30.00–100.00)

## 2024-03-22 LAB — MICROALBUMIN / CREATININE URINE RATIO
Creatinine,U: 227.5 mg/dL
Microalb Creat Ratio: 27.6 mg/g (ref 0.0–30.0)
Microalb, Ur: 6.3 mg/dL — ABNORMAL HIGH (ref 0.7–1.9)

## 2024-03-22 LAB — HEMOGLOBIN A1C: Hgb A1c MFr Bld: 6.1 % (ref 4.6–6.5)

## 2024-03-23 ENCOUNTER — Ambulatory Visit: Admitting: Internal Medicine

## 2024-03-25 ENCOUNTER — Ambulatory Visit: Admitting: Internal Medicine

## 2024-03-25 ENCOUNTER — Encounter: Payer: Self-pay | Admitting: Internal Medicine

## 2024-03-25 VITALS — BP 124/78 | HR 75 | Temp 98.4°F | Ht 66.0 in | Wt 160.0 lb

## 2024-03-25 DIAGNOSIS — N1831 Chronic kidney disease, stage 3a: Secondary | ICD-10-CM | POA: Diagnosis not present

## 2024-03-25 DIAGNOSIS — J4530 Mild persistent asthma, uncomplicated: Secondary | ICD-10-CM

## 2024-03-25 DIAGNOSIS — R739 Hyperglycemia, unspecified: Secondary | ICD-10-CM

## 2024-03-25 DIAGNOSIS — Z0001 Encounter for general adult medical examination with abnormal findings: Secondary | ICD-10-CM

## 2024-03-25 DIAGNOSIS — Z0000 Encounter for general adult medical examination without abnormal findings: Secondary | ICD-10-CM

## 2024-03-25 DIAGNOSIS — E559 Vitamin D deficiency, unspecified: Secondary | ICD-10-CM | POA: Diagnosis not present

## 2024-03-25 DIAGNOSIS — I10 Essential (primary) hypertension: Secondary | ICD-10-CM | POA: Diagnosis not present

## 2024-03-25 DIAGNOSIS — E7800 Pure hypercholesterolemia, unspecified: Secondary | ICD-10-CM

## 2024-03-25 MED ORDER — EZETIMIBE 10 MG PO TABS: 10.0000 mg | ORAL_TABLET | Freq: Every day | ORAL | 3 refills | Status: AC

## 2024-03-25 MED ORDER — FUROSEMIDE 20 MG PO TABS: 20.0000 mg | ORAL_TABLET | Freq: Every day | ORAL | 3 refills | Status: AC

## 2024-03-25 MED ORDER — NEBIVOLOL HCL 2.5 MG PO TABS: 2.5000 mg | ORAL_TABLET | Freq: Every day | ORAL | 3 refills | Status: AC

## 2024-03-25 MED ORDER — DILTIAZEM HCL ER COATED BEADS 240 MG PO CP24: 240.0000 mg | ORAL_CAPSULE | Freq: Every day | ORAL | 3 refills | Status: AC

## 2024-03-25 MED ORDER — FENOFIBRATE 160 MG PO TABS: 160.0000 mg | ORAL_TABLET | Freq: Every day | ORAL | 3 refills | Status: AC

## 2024-03-25 MED ORDER — ATORVASTATIN CALCIUM 40 MG PO TABS: 40.0000 mg | ORAL_TABLET | Freq: Two times a day (BID) | ORAL | 3 refills | Status: AC

## 2024-03-25 NOTE — Assessment & Plan Note (Signed)
 Lab Results  Component Value Date   HGBA1C 6.1 03/22/2024   Stable, pt to continue current medical treatment  - diet, wt control

## 2024-03-25 NOTE — Progress Notes (Addendum)
 Patient ID: Jake Simon, male   DOB: 06/29/1945, 79 y.o.   MRN: [id number]         Chief Complaint:: wellness exam and asthma with chronic prednisone , ckd3a, hyperglycemia, hld, htn, low vit d       HPI:  Jake Simon is a 79 y.o. male here for wellness exam; up to date               Also Pt denies chest pain, increased sob or doe, wheezing, orthopnea, PND, increased LE swelling, palpitations, dizziness or syncope.   Pt denies polydipsia, polyuria, or new focal neuro s/s.    Pt denies fever, wt loss, night sweats, loss of appetite, or other constitutional symptoms   Wt Readings from Last 3 Encounters:  03/25/24 160 lb (72.6 kg)  09/21/23 161 lb (73 kg)  05/19/23 163 lb (73.9 kg)   BP Readings from Last 3 Encounters:  03/25/24 124/78  09/21/23 130/66  03/23/23 124/72   Immunization History  Administered Date(s) Administered   Fluad Quad(high Dose 65+) 12/04/2018, 12/29/2019, 11/06/2020, 11/28/2021   H1N1 02/15/2008   INFLUENZA, HIGH DOSE SEASONAL PF 12/08/2013, 11/22/2015, 11/25/2016, 12/31/2017, 12/14/2023   Influenza Split 12/10/2010, 11/11/2011   Influenza Whole 01/05/2008, 12/12/2008, 12/11/2009   Influenza,inj,Quad PF,6+ Mos 11/23/2012, 11/16/2014   Influenza-Unspecified 11/14/2022   PFIZER(Purple Top)SARS-COV-2 Vaccination 04/10/2019, 05/05/2019, 01/16/2020   Pfizer Covid-19 Vaccine Bivalent Booster 56yrs & up 11/27/2020   Pfizer(Comirnaty )Fall Seasonal Vaccine 12 years and older 12/25/2021   Pneumococcal Conjugate-13 09/20/2013   Pneumococcal Polysaccharide-23 05/22/2009   RSV,unspecified 04/07/2023   Respiratory Syncytial Virus Vaccine,Recomb Aduvanted(Arexvy) 04/07/2023   Td 05/02/2008   Tdap 08/24/2018   Zoster Recombinant(Shingrix) 10/06/2016, 09/10/2017   Zoster, Live 08/20/2009   Health Maintenance Due  Topic Date Due   Medicare Annual Wellness (AWV)  05/18/2024      Past Medical History:  Diagnosis Date   Allergic rhinitis    Asthma    HTN  (hypertension)    Hyperlipidemia    high triglycerides   Sleep apnea    moderate   Vision blurred    epithelial basement membrane dystrophy   Past Surgical History:  Procedure Laterality Date   COLONOSCOPY     dental implants     HERNIA REPAIR     POLYPECTOMY     TONSILLECTOMY      reports that he has never smoked. He has never used smokeless tobacco. He reports current alcohol use. He reports that he does not use drugs. family history includes Alzheimer's disease in his mother; Arthritis in his sister; Asthma in his father; Breast cancer in his mother; COPD in his father; Cancer in his sister; Mental illness in his mother; Stroke in his father. Allergies[1] Medications Ordered Prior to Encounter[2]      ROS:  All others reviewed and negative.  Objective        PE:  BP 124/78 (BP Location: Right Arm, Patient Position: Sitting, Cuff Size: Normal)   Pulse 75   Temp 98.4 F (36.9 C) (Oral)   Ht 5' 6 (1.676 m)   Wt 160 lb (72.6 kg)   SpO2 98%   BMI 25.82 kg/m                 Constitutional: Pt appears in NAD               HENT: Head: NCAT.                Right Ear: External ear  normal.                 Left Ear: External ear normal.                Eyes: . Pupils are equal, round, and reactive to light. Conjunctivae and EOM are normal               Nose: without d/c or deformity               Neck: Neck supple. Gross normal ROM               Cardiovascular: Normal rate and regular rhythm.                 Pulmonary/Chest: Effort normal and breath sounds without rales or wheezing.                Abd:  Soft, NT, ND, + BS, no organomegaly               Neurological: Pt is alert. At baseline orientation, motor grossly intact               Skin: Skin is warm. No rashes, no other new lesions, LE edema - none               Psychiatric: Pt behavior is normal without agitation   Micro: none  Cardiac tracings I have personally interpreted today:  none  Pertinent Radiological  findings (summarize): none   Lab Results  Component Value Date   WBC 8.4 03/22/2024   HGB 14.8 03/22/2024   HCT 42.8 03/22/2024   PLT 191.0 03/22/2024   GLUCOSE 122 (H) 03/22/2024   CHOL 125 03/22/2024   TRIG 119.0 03/22/2024   HDL 43.20 03/22/2024   LDLDIRECT 106.0 09/04/2020   LDLCALC 58 03/22/2024   ALT 18 03/22/2024   AST 19 03/22/2024   NA 138 03/22/2024   K 4.1 03/22/2024   CL 102 03/22/2024   CREATININE 1.81 (H) 03/22/2024   BUN 28 (H) 03/22/2024   CO2 28 03/22/2024   TSH 1.46 03/22/2024   PSA 0.73 03/22/2024   HGBA1C 6.1 03/22/2024   MICROALBUR 6.3 (H) 03/22/2024   Assessment/Plan:  Jake Simon is a 79 y.o. White or Caucasian [1] male with  has a past medical history of Allergic rhinitis, Asthma, HTN (hypertension), Hyperlipidemia, Sleep apnea, and Vision blurred.  Encounter for well adult exam with abnormal findings Age and sex appropriate education and counseling updated with regular exercise and diet Referrals for preventative services - none needed Immunizations addressed - none needed Smoking counseling  - none needed Evidence for depression or other mood disorder - none significant Most recent labs reviewed. I have personally reviewed and have noted: 1) the patient's medical and social history 2) The patient's current medications and supplements 3) The patient's height, weight, and BMI have been recorded in the chart   Asthma, chronic Stable but still requires prednisone , now for referral pulmonary for management  CKD (chronic kidney disease) stage 3, GFR 30-59 ml/min (HCC) Ckd 3a  Lab Results  Component Value Date   CREATININE 1.81 (H) 03/22/2024   Stable overall, cont to avoid nephrotoxins, to f/u Dr Marlee Renal as planned q 6 mo   Hyperglycemia Lab Results  Component Value Date   HGBA1C 6.1 03/22/2024   Stable, pt to continue current medical treatment  - diet, wt control   Hyperlipidemia Lab Results  Component Value Date    [facility]  58 03/22/2024   Stable, pt to continue current statin lipitor 40 mg every day, zetia  10 qd   Hypertension, uncontrolled BP Readings from Last 3 Encounters:  03/25/24 124/78  09/21/23 130/66  03/23/23 124/72   Stable, pt to continue medical treatment cardizem  CD 240 every day, bystolic  2.5 qd   Vitamin D  deficiency Last vitamin D  Lab Results  Component Value Date   VD25OH 53.29 03/22/2024   Stable, cont oral replacement  Followup: Return in about 6 months (around 09/22/2024).  Lynwood Rush, MD 03/25/2024 1:11 PM [facility]     [1]  Allergies Allergen Reactions   Losartan  Other (See Comments)    CKD  [2]  Current Outpatient Medications on File Prior to Visit  Medication Sig Dispense Refill   albuterol  (VENTOLIN  HFA) 108 (90 Base) MCG/ACT inhaler Inhale 2 puffs into the lungs every 6 (six) hours as needed for wheezing or shortness of breath. 3 each 3   ARNUITY ELLIPTA  100 MCG/ACT AEPB USE ONE INHALATION ONCE DAILY AS DIRECTED 30 each 11   aspirin  EC 81 MG tablet Take 1 tablet (81 mg total) by mouth daily. Swallow whole. 30 tablet 12   sodium chloride  (MURO 128) 5 % ophthalmic ointment Place 1 application into both eyes as needed for irritation.     No current facility-administered medications on file prior to visit.

## 2024-03-25 NOTE — Assessment & Plan Note (Signed)
 Stable but still requires prednisone , now for referral pulmonary for management

## 2024-03-25 NOTE — Assessment & Plan Note (Signed)
 Lab Results  Component Value Date   LDLCALC 58 03/22/2024   Stable, pt to continue current statin lipitor 40 mg every day, zetia  10 qd

## 2024-03-25 NOTE — Patient Instructions (Signed)
 Please continue all other medications as before, and refills have been done if requested.  Please have the pharmacy call with any other refills you may need.  Please continue your efforts at being more active, low cholesterol diet, and weight control.  You are otherwise up to date with prevention measures today.  Please keep your appointments with your specialists as you may have planned  You will be contacted regarding the referral for: Pulmonary  Your Lab tests were good!  Please make an Appointment to return in 6 months, or sooner if needed, to Dr Joshua who has accepted you as a transferring patient

## 2024-03-25 NOTE — Assessment & Plan Note (Signed)
 BP Readings from Last 3 Encounters:  03/25/24 124/78  09/21/23 130/66  03/23/23 124/72   Stable, pt to continue medical treatment cardizem  CD 240 every day, bystolic  2.5 qd

## 2024-03-25 NOTE — Assessment & Plan Note (Signed)

## 2024-03-25 NOTE — Assessment & Plan Note (Addendum)
 Ckd 3a  Lab Results  Component Value Date   CREATININE 1.81 (H) 03/22/2024   Stable overall, cont to avoid nephrotoxins, to f/u Dr Marlee Renal as planned q 6 mo

## 2024-03-25 NOTE — Assessment & Plan Note (Signed)
 Last vitamin D  Lab Results  Component Value Date   VD25OH 53.29 03/22/2024   Stable, cont oral replacement

## 2024-04-14 ENCOUNTER — Ambulatory Visit: Admitting: Internal Medicine

## 2024-05-19 ENCOUNTER — Ambulatory Visit

## 2024-05-20 ENCOUNTER — Ambulatory Visit
